# Patient Record
Sex: Male | Born: 1965 | Race: Black or African American | Hispanic: No | Smoking: Current every day smoker
Health system: Southern US, Community
[De-identification: ages and names within clinical notes are randomized; demographics above are authoritative.]

## PROBLEM LIST (undated history)

## (undated) DIAGNOSIS — J449 Chronic obstructive pulmonary disease, unspecified: Secondary | ICD-10-CM

## (undated) DIAGNOSIS — I272 Pulmonary hypertension, unspecified: Secondary | ICD-10-CM

## (undated) DIAGNOSIS — I2724 Chronic thromboembolic pulmonary hypertension: Secondary | ICD-10-CM

## (undated) DIAGNOSIS — M25511 Pain in right shoulder: Secondary | ICD-10-CM

## (undated) DIAGNOSIS — F1011 Alcohol abuse, in remission: Secondary | ICD-10-CM

## (undated) DIAGNOSIS — D649 Anemia, unspecified: Secondary | ICD-10-CM

## (undated) DIAGNOSIS — I2699 Other pulmonary embolism without acute cor pulmonale: Secondary | ICD-10-CM

## (undated) DIAGNOSIS — M25561 Pain in right knee: Secondary | ICD-10-CM

## (undated) HISTORY — DX: Pain in right knee: M25.561

## (undated) HISTORY — DX: Pain in right shoulder: M25.511

## (undated) HISTORY — PX: TONSILLECTOMY: SUR1361

---

## 2011-04-22 ENCOUNTER — Emergency Department (HOSPITAL_COMMUNITY)
Admission: EM | Admit: 2011-04-22 | Discharge: 2011-04-22 | Disposition: A | Discharge status: Home / Self Care | Payer: Self-pay | Attending: Emergency Medicine | Admitting: Emergency Medicine

## 2011-04-22 DIAGNOSIS — K047 Periapical abscess without sinus: Secondary | ICD-10-CM | POA: Insufficient documentation

## 2011-04-22 DIAGNOSIS — K089 Disorder of teeth and supporting structures, unspecified: Secondary | ICD-10-CM | POA: Insufficient documentation

## 2011-10-28 ENCOUNTER — Encounter: Payer: Self-pay | Admitting: *Deleted

## 2011-10-28 ENCOUNTER — Emergency Department (HOSPITAL_COMMUNITY)
Admission: EM | Admit: 2011-10-28 | Discharge: 2011-10-28 | Disposition: A | Discharge status: Home / Self Care | Payer: Self-pay | Attending: Emergency Medicine | Admitting: Emergency Medicine

## 2011-10-28 DIAGNOSIS — M545 Low back pain, unspecified: Secondary | ICD-10-CM

## 2011-10-28 DIAGNOSIS — M5416 Radiculopathy, lumbar region: Secondary | ICD-10-CM

## 2011-10-28 MED ORDER — PREDNISONE 50 MG PO TABS: ORAL_TABLET | ORAL | Status: DC

## 2011-10-28 MED ORDER — HYDROCODONE-ACETAMINOPHEN 5-325 MG PO TABS
1.0000 | ORAL_TABLET | Freq: Once | ORAL | Status: AC
  Administered 2011-10-28: 1 via ORAL
  Filled 2011-10-28: qty 1

## 2011-10-28 MED ORDER — PREDNISONE 20 MG PO TABS
60.0000 mg | ORAL_TABLET | Freq: Once | ORAL | Status: AC
  Administered 2011-10-28: 60 mg via ORAL
  Filled 2011-10-28: qty 3

## 2011-10-28 MED ORDER — HYDROCODONE-ACETAMINOPHEN 5-325 MG PO TABS: 1.0000 | ORAL_TABLET | Freq: Four times a day (QID) | ORAL | Status: AC | PRN

## 2011-10-28 NOTE — ED Notes (Addendum)
Pain rt lower back radiation down post rt thigh. No injury, says he has had pain  In his back for over 2 years.  Became worse today app 2 pm.

## 2011-10-28 NOTE — ED Notes (Signed)
Pt states lower back pain with pain radiating down right leg. Has had same pain before and dx with sciatica, per pt.

## 2011-10-28 NOTE — ED Provider Notes (Signed)
History     CSN: [id number] Arrival date & time: 10/28/2011  5:56 PM   First MD Initiated Contact with Patient 10/28/11 1823      Chief Complaint  Patient presents with  . Back Pain    (Consider location/radiation/quality/duration/timing/severity/associated sxs/prior treatment) HPI Comments: Has had pain in lower back for a couple years with ocassional flare-ups.  Pain worsened 2 days ago and radiates to the R knee.  Patient is a 45 y.o. male presenting with back pain. The history is provided by the patient. No language interpreter was used.  Back Pain  This is a recurrent problem. The current episode started 2 days ago. The problem occurs constantly. The pain is present in the lumbar spine. The pain is at a severity of 8/10. The symptoms are aggravated by twisting and bending. The pain is the same all the time.    History reviewed. No pertinent past medical history.  History reviewed. No pertinent past surgical history.  No family history on file.  History  Substance Use Topics  . Smoking status: Current Everyday Smoker  . Smokeless tobacco: Not on file  . Alcohol Use: Yes     12 pack every 3-4 days.      Review of Systems  Musculoskeletal: Positive for back pain.  All other systems reviewed and are negative.    Allergies  Review of patient's allergies indicates no known allergies.  Home Medications  No current outpatient prescriptions on file.  BP 118/85  Pulse 88  Temp(Src) 98.7 F (37.1 C) (Oral)  Resp 16  Ht 5\' 9"  (1.753 m)  Wt 160 lb (72.576 kg)  BMI 23.63 kg/m2  SpO2 98%  Physical Exam  Nursing note and vitals reviewed. Constitutional: He is oriented to person, place, and time. He appears well-developed and well-nourished.  HENT:  Head: Normocephalic and atraumatic.  Eyes: EOM are normal.  Neck: Normal range of motion.  Cardiovascular: Normal rate, regular rhythm, normal heart sounds and intact distal pulses.   Pulmonary/Chest: Effort normal  and breath sounds normal. No respiratory distress.  Abdominal: Soft. He exhibits no distension. There is no tenderness.  Musculoskeletal:       Lumbar back: He exhibits decreased range of motion, tenderness and pain. He exhibits no bony tenderness.       Back:  Neurological: He is alert and oriented to person, place, and time. He displays normal reflexes. Coordination normal.  Skin: Skin is warm and dry.  Psychiatric: He has a normal mood and affect. Judgment normal.    ED Course  Procedures (including critical care time)  Labs Reviewed - No data to display No results found.   No diagnosis found.    MDM          Worthy Rancher, PA 10/28/11 [id number]

## 2011-10-30 NOTE — ED Provider Notes (Signed)
Medical screening examination/treatment/procedure(s) were performed by non-physician practitioner and as supervising physician I was immediately available for consultation/collaboration.  Nicoletta Dress. Colon Branch, MD 10/30/11 [phone]

## 2015-03-06 ENCOUNTER — Emergency Department (HOSPITAL_COMMUNITY): Payer: PRIVATE HEALTH INSURANCE

## 2015-03-06 ENCOUNTER — Emergency Department (HOSPITAL_COMMUNITY)
Admission: EM | Admit: 2015-03-06 | Discharge: 2015-03-06 | Disposition: A | Discharge status: Home / Self Care | Payer: PRIVATE HEALTH INSURANCE | Attending: Emergency Medicine | Admitting: Emergency Medicine

## 2015-03-06 ENCOUNTER — Encounter (HOSPITAL_COMMUNITY): Payer: Self-pay | Admitting: Emergency Medicine

## 2015-03-06 DIAGNOSIS — Z720 Tobacco use: Secondary | ICD-10-CM | POA: Diagnosis not present

## 2015-03-06 DIAGNOSIS — M5440 Lumbago with sciatica, unspecified side: Secondary | ICD-10-CM | POA: Diagnosis not present

## 2015-03-06 DIAGNOSIS — M545 Low back pain: Secondary | ICD-10-CM | POA: Diagnosis present

## 2015-03-06 DIAGNOSIS — K5900 Constipation, unspecified: Secondary | ICD-10-CM | POA: Insufficient documentation

## 2015-03-06 DIAGNOSIS — Z7982 Long term (current) use of aspirin: Secondary | ICD-10-CM | POA: Insufficient documentation

## 2015-03-06 LAB — CBC WITH DIFFERENTIAL/PLATELET
BASOS ABS: 0 10*3/uL (ref 0.0–0.1)
BASOS PCT: 0 % (ref 0–1)
EOS PCT: 2 % (ref 0–5)
Eosinophils Absolute: 0.1 10*3/uL (ref 0.0–0.7)
HEMATOCRIT: 48.6 % (ref 39.0–52.0)
Hemoglobin: 16.4 g/dL (ref 13.0–17.0)
LYMPHS PCT: 29 % (ref 12–46)
Lymphs Abs: 2.1 10*3/uL (ref 0.7–4.0)
MCH: 32.7 pg (ref 26.0–34.0)
MCHC: 33.7 g/dL (ref 30.0–36.0)
MCV: 96.8 fL (ref 78.0–100.0)
Monocytes Absolute: 0.7 10*3/uL (ref 0.1–1.0)
Monocytes Relative: 10 % (ref 3–12)
Neutro Abs: 4.3 10*3/uL (ref 1.7–7.7)
Neutrophils Relative %: 59 % (ref 43–77)
PLATELETS: 291 10*3/uL (ref 150–400)
RBC: 5.02 MIL/uL (ref 4.22–5.81)
RDW: 13.3 % (ref 11.5–15.5)
WBC: 7.2 10*3/uL (ref 4.0–10.5)

## 2015-03-06 LAB — URINALYSIS, ROUTINE W REFLEX MICROSCOPIC
BILIRUBIN URINE: NEGATIVE
Glucose, UA: NEGATIVE mg/dL
Hgb urine dipstick: NEGATIVE
KETONES UR: NEGATIVE mg/dL
Leukocytes, UA: NEGATIVE
NITRITE: NEGATIVE
PH: 5.5 (ref 5.0–8.0)
Protein, ur: NEGATIVE mg/dL
Specific Gravity, Urine: 1.02 (ref 1.005–1.030)
UROBILINOGEN UA: 0.2 mg/dL (ref 0.0–1.0)

## 2015-03-06 MED ORDER — DOCUSATE SODIUM 100 MG PO CAPS: 100.0000 mg | ORAL_CAPSULE | Freq: Two times a day (BID) | ORAL | Status: DC

## 2015-03-06 MED ORDER — NAPROXEN 500 MG PO TABS: 500.0000 mg | ORAL_TABLET | Freq: Two times a day (BID) | ORAL | Status: DC

## 2015-03-06 MED ORDER — METHOCARBAMOL 500 MG PO TABS: 500.0000 mg | ORAL_TABLET | Freq: Three times a day (TID) | ORAL | Status: DC | PRN

## 2015-03-06 MED ORDER — CYCLOBENZAPRINE HCL 10 MG PO TABS
10.0000 mg | ORAL_TABLET | Freq: Once | ORAL | Status: AC
  Administered 2015-03-06: 10 mg via ORAL
  Filled 2015-03-06: qty 1

## 2015-03-06 MED ORDER — HYDROCODONE-ACETAMINOPHEN 5-325 MG PO TABS
1.0000 | ORAL_TABLET | Freq: Once | ORAL | Status: AC
  Administered 2015-03-06: 1 via ORAL
  Filled 2015-03-06: qty 1

## 2015-03-06 MED ORDER — MAGNESIUM CITRATE PO SOLN
1.0000 | Freq: Once | ORAL | Status: AC
  Administered 2015-03-06: 1 via ORAL
  Filled 2015-03-06: qty 296

## 2015-03-06 MED ORDER — TRAMADOL HCL 50 MG PO TABS: 50.0000 mg | ORAL_TABLET | Freq: Four times a day (QID) | ORAL | Status: DC | PRN

## 2015-03-06 MED ORDER — PREDNISONE 50 MG PO TABS
60.0000 mg | ORAL_TABLET | Freq: Once | ORAL | Status: AC
  Administered 2015-03-06: 60 mg via ORAL
  Filled 2015-03-06 (×2): qty 1

## 2015-03-06 NOTE — ED Notes (Signed)
Pt is getting dressed.

## 2015-03-06 NOTE — Discharge Instructions (Signed)
Back Pain, Adult Back pain is very common. The pain often gets better over time. The cause of back pain is usually not dangerous. Most people can learn to manage their back pain on their own.  HOME CARE   Stay active. Start with short walks on flat ground if you can. Try to walk farther each day.  Do not sit, drive, or stand in one place for more than 30 minutes. Do not stay in bed.  Do not avoid exercise or work. Activity can help your back heal faster.  Be careful when you bend or lift an object. Bend at your knees, keep the object close to you, and do not twist.  Sleep on a firm mattress. Lie on your side, and bend your knees. If you lie on your back, put a pillow under your knees.  Only take medicines as told by your doctor.  Put ice on the injured area.  Put ice in a plastic bag.  Place a towel between your skin and the bag.  Leave the ice on for 15-20 minutes, 03-04 times a day for the first 2 to 3 days. After that, you can switch between ice and heat packs.  Ask your doctor about back exercises or massage.  Avoid feeling anxious or stressed. Find good ways to deal with stress, such as exercise. GET HELP RIGHT AWAY IF:   Your pain does not go away with rest or medicine.  Your pain does not go away in 1 week.  You have new problems.  You do not feel well.  The pain spreads into your legs.  You cannot control when you poop (bowel movement) or pee (urinate).  Your arms or legs feel weak or lose feeling (numbness).  You feel sick to your stomach (nauseous) or throw up (vomit).  You have belly (abdominal) pain.  You feel like you may pass out (faint). MAKE SURE YOU:   Understand these instructions.  Will watch your condition.  Will get help right away if you are not doing well or get worse. Document Released: 04/13/2008 Document Revised: 01/18/2012 Document Reviewed: 02/27/2014 [facility] Patient Information 2015 [place], [place]. This information is not intended  to replace advice given to you by your health care provider. Make sure you discuss any questions you have with your health care provider.    Constipation Constipation is when a person:  Poops (has a bowel movement) less than 3 times a week.  Has a hard time pooping.  Has poop that is dry, hard, or bigger than normal. HOME CARE   Eat foods with a lot of fiber in them. This includes fruits, vegetables, beans, and whole grains such as brown rice.  Avoid fatty foods and foods with a lot of sugar. This includes french fries, hamburgers, cookies, candy, and soda.  If you are not getting enough fiber from food, take products with added fiber in them (supplements).  Drink enough fluid to keep your pee (urine) clear or pale yellow.  Exercise on a regular basis, or as told by your doctor.  Go to the restroom when you feel like you need to poop. Do not hold it.  Only take medicine as told by your doctor. Do not take medicines that help you poop (laxatives) without talking to your doctor first. GET HELP RIGHT AWAY IF:   You have bright red blood in your poop (stool).  Your constipation lasts more than 4 days or gets worse.  You have belly (abdominal) or butt (rectal)  pain.  You have thin poop (as thin as a pencil).  You lose weight, and it cannot be explained. MAKE SURE YOU:   Understand these instructions.  Will watch your condition.  Will get help right away if you are not doing well or get worse. Document Released: 04/13/2008 Document Revised: 10/31/2013 Document Reviewed: 08/07/2013 [facility] Patient Information 2015 [place], [place]. This information is not intended to replace advice given to you by your health care provider. Make sure you discuss any questions you have with your health care provider.

## 2015-03-06 NOTE — ED Provider Notes (Addendum)
CSN: [id number]     Arrival date & time 03/06/15  1142 History   First MD Initiated Contact with Patient 03/06/15 1259     Chief Complaint  Patient presents with  . Back Pain      HPI  Patient presents for evaluation essentially 2 separate complaints. One is an exacerbation of back pain. Bilateral lower back pain. Hurts to move and walk.  He rates to his buttock. Does not radiate to the anterior leg below the knee and any radicular fashion. No weakness leg. He states occasionally his left leg will hurt so bad that it "almost gives out". No change in bowel or bladder habits other than constipation for the last 5 days. No history of blood in his stools now, or ever. No fevers no chills. No intra-abdominal pain. Still has a good appetite without nausea or vomiting. States he didn't need yesterday because "I was afraid to because I haven't gone in 4 days".   History reviewed. No pertinent past medical history. History reviewed. No pertinent past surgical history. History reviewed. No pertinent family history. History  Substance Use Topics  . Smoking status: Current Every Day Smoker -- 1.00 packs/day  . Smokeless tobacco: Not on file  . Alcohol Use: Yes     Comment: 12 pack every 3-4 days.    Review of Systems  Constitutional: Negative for fever, chills, diaphoresis, appetite change and fatigue.  HENT: Negative for mouth sores, sore throat and trouble swallowing.   Eyes: Negative for visual disturbance.  Respiratory: Negative for cough, chest tightness, shortness of breath and wheezing.   Cardiovascular: Negative for chest pain.  Gastrointestinal: Positive for constipation. Negative for nausea, vomiting, abdominal pain, diarrhea and abdominal distention.  Endocrine: Negative for polydipsia, polyphagia and polyuria.  Genitourinary: Negative for dysuria, frequency and hematuria.  Musculoskeletal: Positive for back pain. Negative for gait problem.  Skin: Negative for color change, pallor  and rash.  Neurological: Negative for dizziness, syncope, light-headedness and headaches.  Hematological: Does not bruise/bleed easily.  Psychiatric/Behavioral: Negative for behavioral problems and confusion.      Allergies  Review of patient's allergies indicates no known allergies.  Home Medications   Prior to Admission medications   Medication Sig Start Date End Date Taking? Authorizing Provider  Aspirin-Acetaminophen-Caffeine (EXCEDRIN MIGRAINE PO) Take 1 tablet by mouth daily.   Yes Historical Provider, MD  ibuprofen (ADVIL,MOTRIN) 200 MG tablet Take 200 mg by mouth every 6 (six) hours as needed.   Yes Historical Provider, MD  docusate sodium (COLACE) 100 MG capsule Take 1 capsule (100 mg total) by mouth every 12 (twelve) hours. 03/06/15   Rolland Porter, MD  methocarbamol (ROBAXIN) 500 MG tablet Take 1 tablet (500 mg total) by mouth 3 (three) times daily between meals as needed. 03/06/15   Rolland Porter, MD  naproxen (NAPROSYN) 500 MG tablet Take 1 tablet (500 mg total) by mouth 2 (two) times daily. 03/06/15   Rolland Porter, MD  predniSONE (DELTASONE) 50 MG tablet Take one tab po QD until gone. Patient not taking: Reported on 03/06/2015 10/28/11   Worthy Rancher, PA-C  traMADol (ULTRAM) 50 MG tablet Take 1 tablet (50 mg total) by mouth every 6 (six) hours as needed. 03/06/15   Rolland Porter, MD   BP 116/86 mmHg  Pulse 79  Temp(Src) 98.2 F (36.8 C) (Oral)  Resp 20  Ht  (1.753 m)  Wt 145 lb (65.772 kg)  BMI 21.40 kg/m2  SpO2 100% Physical Exam  Constitutional: He is  oriented to person, place, and time. He appears well-developed and well-nourished. No distress.  HENT:  Head: Normocephalic.  Eyes: Conjunctivae are normal. Pupils are equal, round, and reactive to light. No scleral icterus.  Neck: Normal range of motion. Neck supple. No thyromegaly present.  Cardiovascular: Normal rate and regular rhythm.  Exam reveals no gallop and no friction rub.   No murmur heard. Pulmonary/Chest:  Effort normal and breath sounds normal. No respiratory distress. He has no wheezes. He has no rales.  Abdominal: Soft. Bowel sounds are normal. He exhibits no distension. There is no tenderness. There is no rebound.  Soft benign abdomen. No tenderness. Subjectively no pain. Normal active bowel sounds.  Musculoskeletal: Normal range of motion.       Back:  Neurological: He is alert and oriented to person, place, and time.  Normal symmetric Strength to shoulder shrug, triceps, biceps, grip,wrist flex/extend,and intrinsics  Norma lsymmetric sensation above and below clavicles, and to all distributions to UEs. Norma symmetric strength to flex/.extend hip and knees, dorsi/plantar flex ankles. Normal symmetric sensation to all distributions to LEs Patellar and achilles reflexes 1-2+. Downgoing Babinski   Skin: Skin is warm and dry. No rash noted.  Psychiatric: He has a normal mood and affect. His behavior is normal.    ED Course  Procedures (including critical care time) Labs Review Labs Reviewed  URINALYSIS, ROUTINE W REFLEX MICROSCOPIC  CBC WITH DIFFERENTIAL/PLATELET    Imaging Review Dg Abd 1 View  03/06/2015   CLINICAL DATA:  Lower abdominal and back pain. Constipation for 5 days.  EXAM: ABDOMEN - 1 VIEW  COMPARISON:  None.  FINDINGS: No evidence of dilated bowel loops. A moderate amount of stool is seen scattered throughout the colon. No radiopaque calculi identified. Lower lumbar spine degenerative changes noted.  IMPRESSION: No acute findings.  Moderate stool burden noted.   Electronically Signed   By: Myles RosenthalJohn  Stahl M.D.   On: 03/06/2015 15:03     EKG Interpretation None      MDM   Final diagnoses:  Constipated  Low back pain with sciatica, sciatica laterality unspecified, unspecified back pain laterality    Plan will be symptomatic treatment for his back. Magnesium citrate here. Half now, half tomorrow if no improvement.    Rolland PorterMark Asaad Gulley, MD 03/06/15 1541  Rolland PorterMark Varvara Legault,  MD 03/06/15 [phone]

## 2015-03-06 NOTE — ED Notes (Signed)
Pt is back from radiology

## 2015-03-06 NOTE — ED Notes (Addendum)
Pt reports lower back pain, bilateral leg tingling, no BM for several days. Pt reports otc miralax with no improvement. Pt denies any abdominal pain,n/v.

## 2016-05-16 IMAGING — DX DG ABDOMEN 1V
1 series · 1 of 1 positions shown · non-contrast
Comparison: None.

CLINICAL DATA: Lower abdominal and back pain. Constipation for 5
days.

EXAM:
ABDOMEN - 1 VIEW

[abdomen supine]
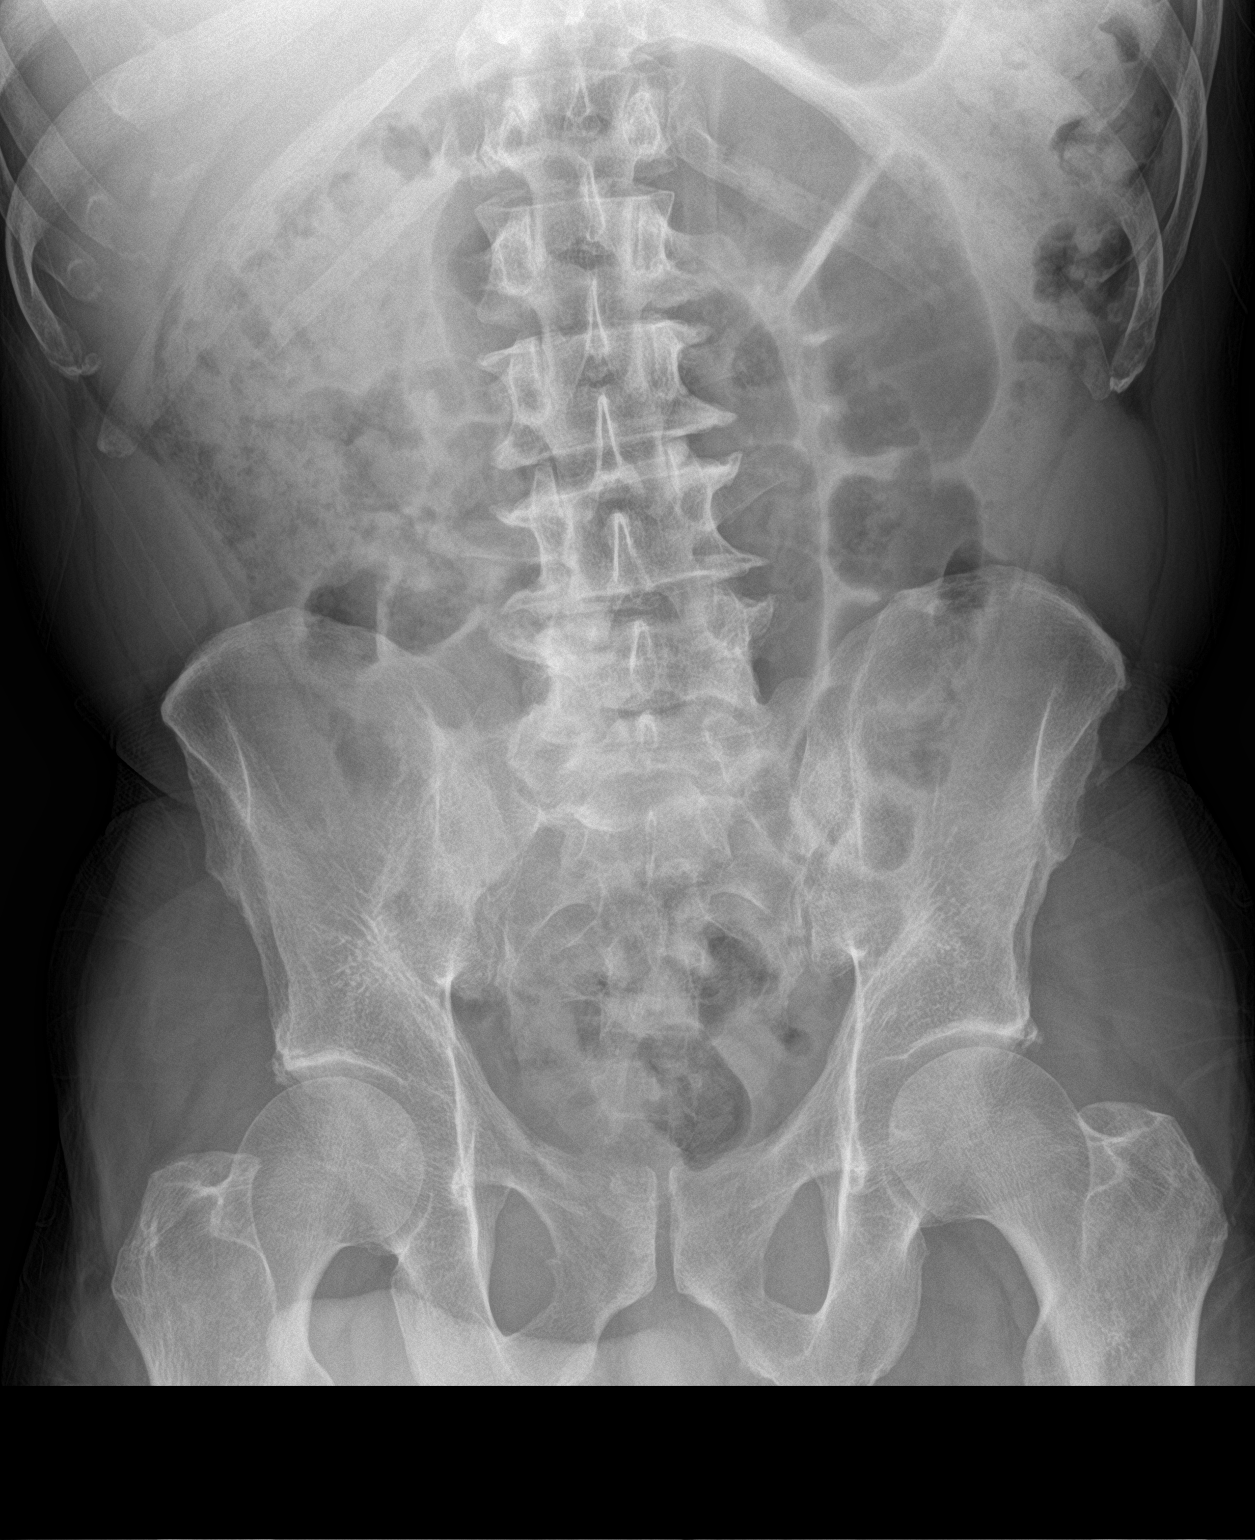

[1 of 1 positions shown; findings below may reference images not displayed]

FINDINGS: No evidence of dilated bowel loops. A moderate amount of stool is
seen scattered throughout the colon. No radiopaque calculi
identified. Lower lumbar spine degenerative changes noted.
IMPRESSION: No acute findings.  Moderate stool burden noted.

## 2017-12-02 ENCOUNTER — Emergency Department (HOSPITAL_COMMUNITY)
Admission: EM | Admit: 2017-12-02 | Discharge: 2017-12-02 | Disposition: A | Discharge status: Home / Self Care | Payer: Self-pay | Attending: Emergency Medicine | Admitting: Emergency Medicine

## 2017-12-02 ENCOUNTER — Emergency Department (HOSPITAL_COMMUNITY): Payer: Self-pay

## 2017-12-02 ENCOUNTER — Other Ambulatory Visit: Payer: Self-pay

## 2017-12-02 ENCOUNTER — Encounter (HOSPITAL_COMMUNITY): Payer: Self-pay

## 2017-12-02 DIAGNOSIS — S8391XA Sprain of unspecified site of right knee, initial encounter: Secondary | ICD-10-CM | POA: Insufficient documentation

## 2017-12-02 DIAGNOSIS — W109XXA Fall (on) (from) unspecified stairs and steps, initial encounter: Secondary | ICD-10-CM | POA: Insufficient documentation

## 2017-12-02 DIAGNOSIS — F17200 Nicotine dependence, unspecified, uncomplicated: Secondary | ICD-10-CM | POA: Insufficient documentation

## 2017-12-02 DIAGNOSIS — Y929 Unspecified place or not applicable: Secondary | ICD-10-CM | POA: Insufficient documentation

## 2017-12-02 DIAGNOSIS — Z79899 Other long term (current) drug therapy: Secondary | ICD-10-CM | POA: Insufficient documentation

## 2017-12-02 DIAGNOSIS — Y9301 Activity, walking, marching and hiking: Secondary | ICD-10-CM | POA: Insufficient documentation

## 2017-12-02 DIAGNOSIS — Y999 Unspecified external cause status: Secondary | ICD-10-CM | POA: Insufficient documentation

## 2017-12-02 MED ORDER — HYDROCODONE-ACETAMINOPHEN 5-325 MG PO TABS: 1.0000 | ORAL_TABLET | Freq: Four times a day (QID) | ORAL | 0 refills | Status: DC | PRN

## 2017-12-02 NOTE — ED Triage Notes (Signed)
Pt slipped on some steps and his right leg ended up underneath him with his lower leg landing on concrete.  Pt has pain to knee and to shin with small avulsion to shin area.

## 2017-12-02 NOTE — ED Notes (Signed)
Pt ambulatory to waiting room. Pt verbalized understanding of discharge instructions.   

## 2017-12-02 NOTE — Discharge Instructions (Signed)
Wear knee immobilizer as applied.  Ice for 20 minutes every 2 hours while awake for the next 2 days.  Hydrocodone is prescribed as needed for pain.  Follow-up with your orthopedic surgery if symptoms are not improving in the next 3-4 days.  The contact information for Dr. Romeo AppleHarrison has been provided in this discharge summary for you to call and make these arrangements.

## 2017-12-02 NOTE — ED Notes (Signed)
Patient transported to X-ray 

## 2017-12-02 NOTE — ED Provider Notes (Signed)
[facility] EMERGENCY DEPARTMENT Provider Note   CSN: [id number] Arrival date & time: 12/02/17  0240     History   Chief Complaint Chief Complaint  Patient presents with  . Knee Injury    HPI COULTON SCHLINK is a 52 y.o. male.  Patient is a 52 year old male presenting for evaluation of a right knee injury.  He reports walking down several stairs this evening, slipping, and twisting his knee awkwardly.  He has been unable to walk due to pain since.  He denies any other injury.   The history is provided by the patient.    History reviewed. No pertinent past medical history.  There are no active problems to display for this patient.   No past surgical history on file.     Home Medications    Prior to Admission medications   Medication Sig Start Date End Date Taking? Authorizing Provider  ibuprofen (ADVIL,MOTRIN) 200 MG tablet Take 200 mg by mouth every 6 (six) hours as needed.   Yes [provider]  Aspirin-Acetaminophen-Caffeine (EXCEDRIN MIGRAINE PO) Take 1 tablet by mouth daily.    [provider]  docusate sodium (COLACE) 100 MG capsule Take 1 capsule (100 mg total) by mouth every 12 (twelve) hours. 03/06/15   Rolland Porter, MD  methocarbamol (ROBAXIN) 500 MG tablet Take 1 tablet (500 mg total) by mouth 3 (three) times daily between meals as needed. 03/06/15   Rolland Porter, MD  naproxen (NAPROSYN) 500 MG tablet Take 1 tablet (500 mg total) by mouth 2 (two) times daily. 03/06/15   Rolland Porter, MD  predniSONE (DELTASONE) 50 MG tablet Take one tab po QD until gone. Patient not taking: Reported on 03/06/2015 10/28/11   Worthy Rancher, PA-C  traMADol (ULTRAM) 50 MG tablet Take 1 tablet (50 mg total) by mouth every 6 (six) hours as needed. 03/06/15   Rolland Porter, MD    Family History No family history on file.  Social History Social History   Tobacco Use  . Smoking status: Current Every Day Smoker    Packs/day: 1.00  . Smokeless tobacco: Never Used    Substance Use Topics  . Alcohol use: Yes    Comment: 12 pack every 3-4 days.  . Drug use: Yes    Types: Marijuana, Cocaine    Comment: last use 03/03/15     Allergies   Patient has no known allergies.   Review of Systems Review of Systems  All other systems reviewed and are negative.    Physical Exam Updated Vital Signs BP 123/87   Pulse (!) 102   Temp (!) 97.5 F (36.4 C) (Oral)   Resp 18   Ht 5\' 9"  (1.753 m)   Wt 61.2 kg (135 lb)   SpO2 97%   BMI 19.94 kg/m   Physical Exam  Constitutional: He appears well-developed and well-nourished. No distress.  HENT:  Head: Normocephalic and atraumatic.  Neck: Normal range of motion. Neck supple.  Musculoskeletal:  The right knee has some swelling/effusion.  Exam is very limited secondary to pain.  He does have an abrasion over the mid anterior tibia, however nothing that requires sutures.  Neurological: He is alert.  Skin: Skin is warm and dry. He is not diaphoretic.  Nursing note and vitals reviewed.    ED Treatments / Results  Labs (all labs ordered are listed, but only abnormal results are displayed) Labs Reviewed - No data to display  EKG  EKG Interpretation None  Radiology No results found.  Procedures Procedures (including critical care time)  Medications Ordered in ED Medications - No data to display   Initial Impression / Assessment and Plan / ED Course  I have reviewed the triage vital signs and the nursing notes.  Pertinent labs & imaging results that were available during my care of the patient were reviewed by me and considered in my medical decision making (see chart for details).  Patient's x-rays are negative for fracture or misalignment.  There is a small joint effusion and prepatellar soft tissue swelling.  I am only able to get a very limited exam secondary to discomfort.  He will be placed in a knee immobilizer and advised to follow-up with orthopedics in the next week for  recheck.  In the meantime, he is to ice, rest.  Final Clinical Impressions(s) / ED Diagnoses   Final diagnoses:  None    ED Discharge Orders    None       Geoffery Lyonselo, Adryanna Friedt, MD 12/02/17 0400

## 2019-02-12 IMAGING — DX DG TIBIA/FIBULA 2V*R*
4 series · 4 of 4 positions shown · non-contrast
Comparison: None.

CLINICAL DATA: Fall going down steps pain to the proximal tibia and
fibula

EXAM:
RIGHT TIBIA AND FIBULA - 2 VIEW

[tibia ap (1 of 2)]
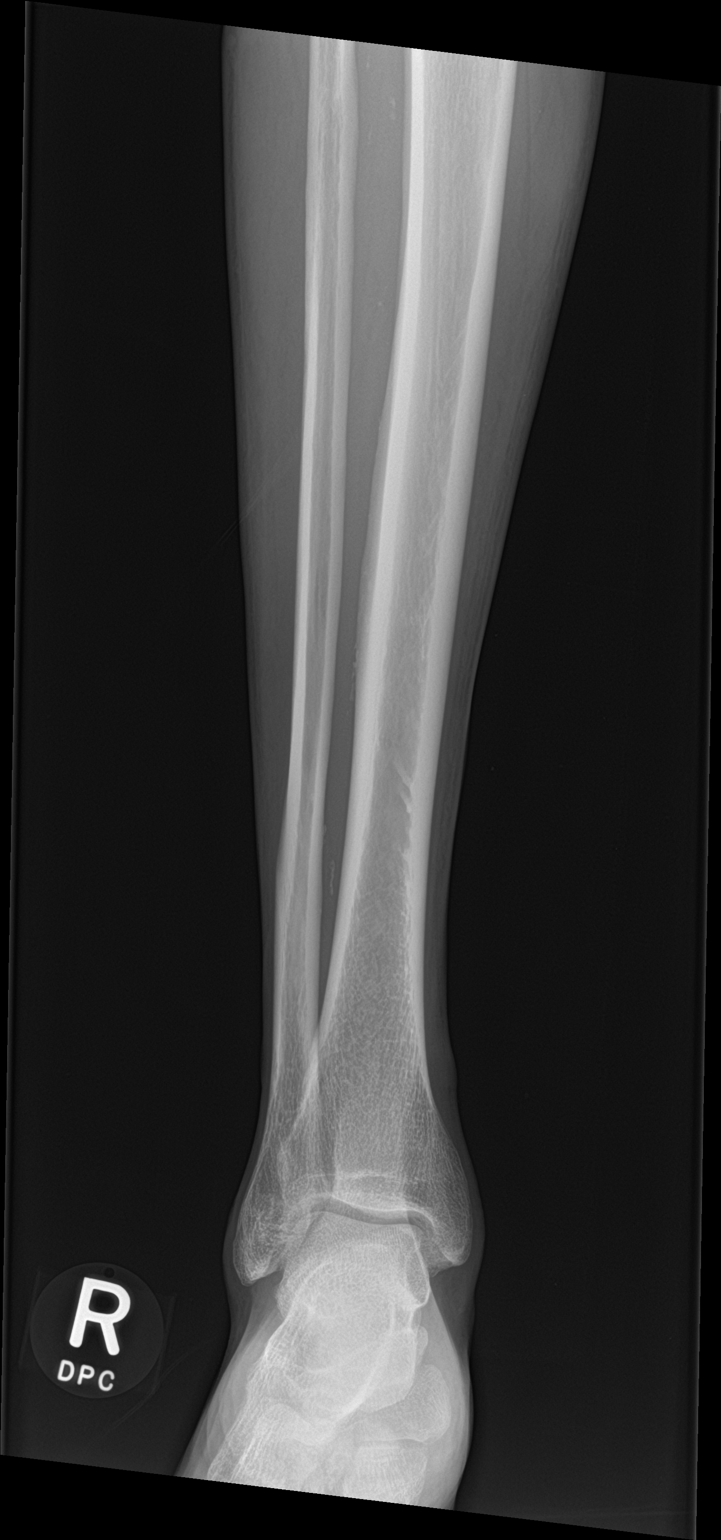

[tibia ap (2 of 2)]
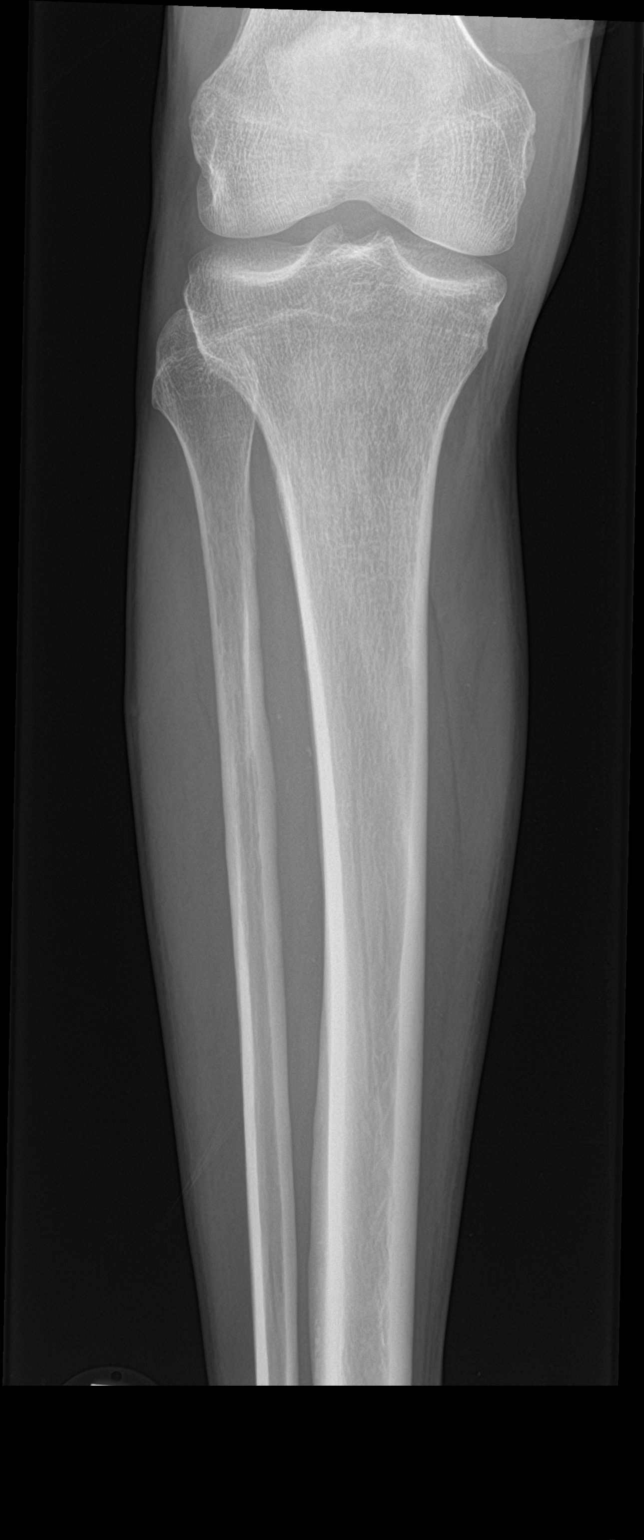

[tibia lat (1 of 2)]
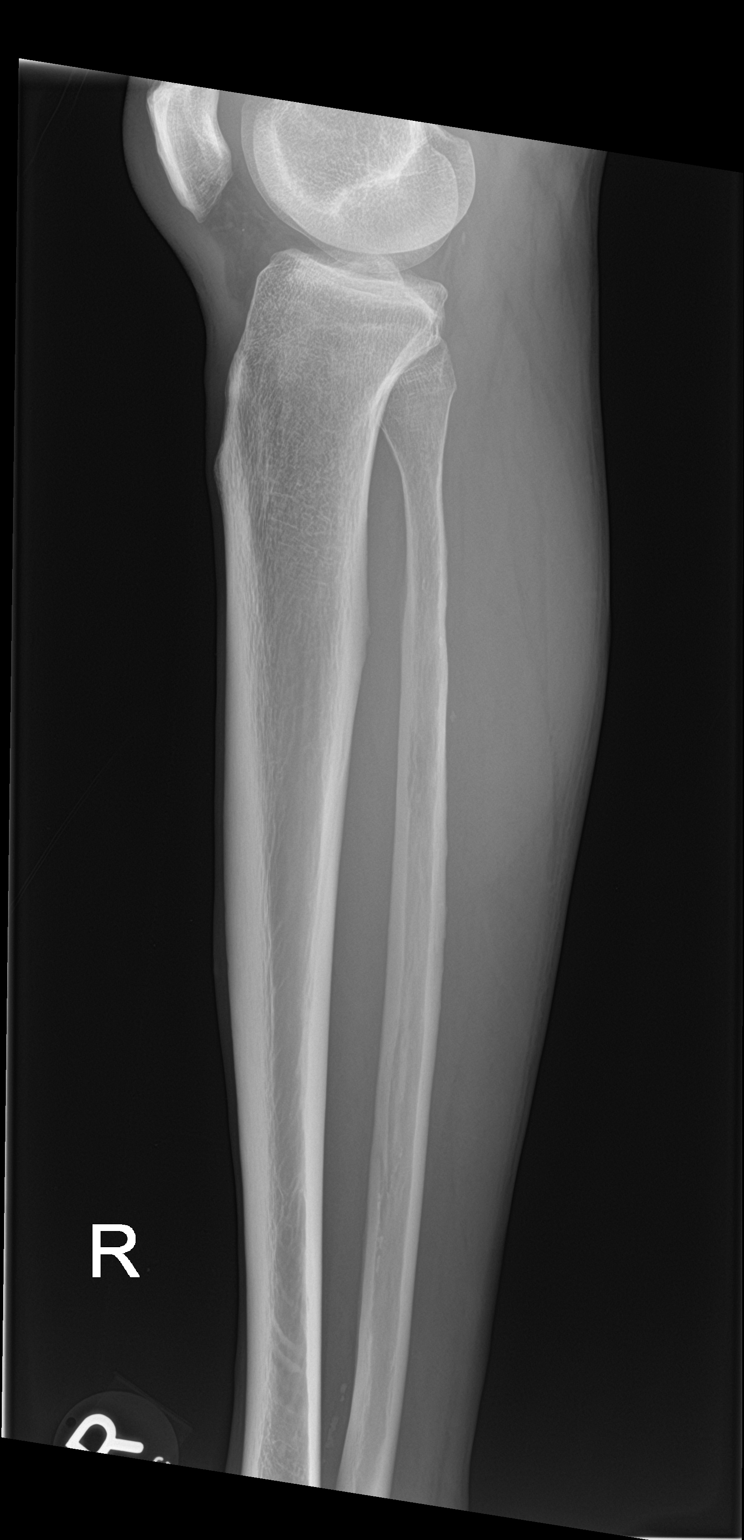

[tibia lat (2 of 2)]
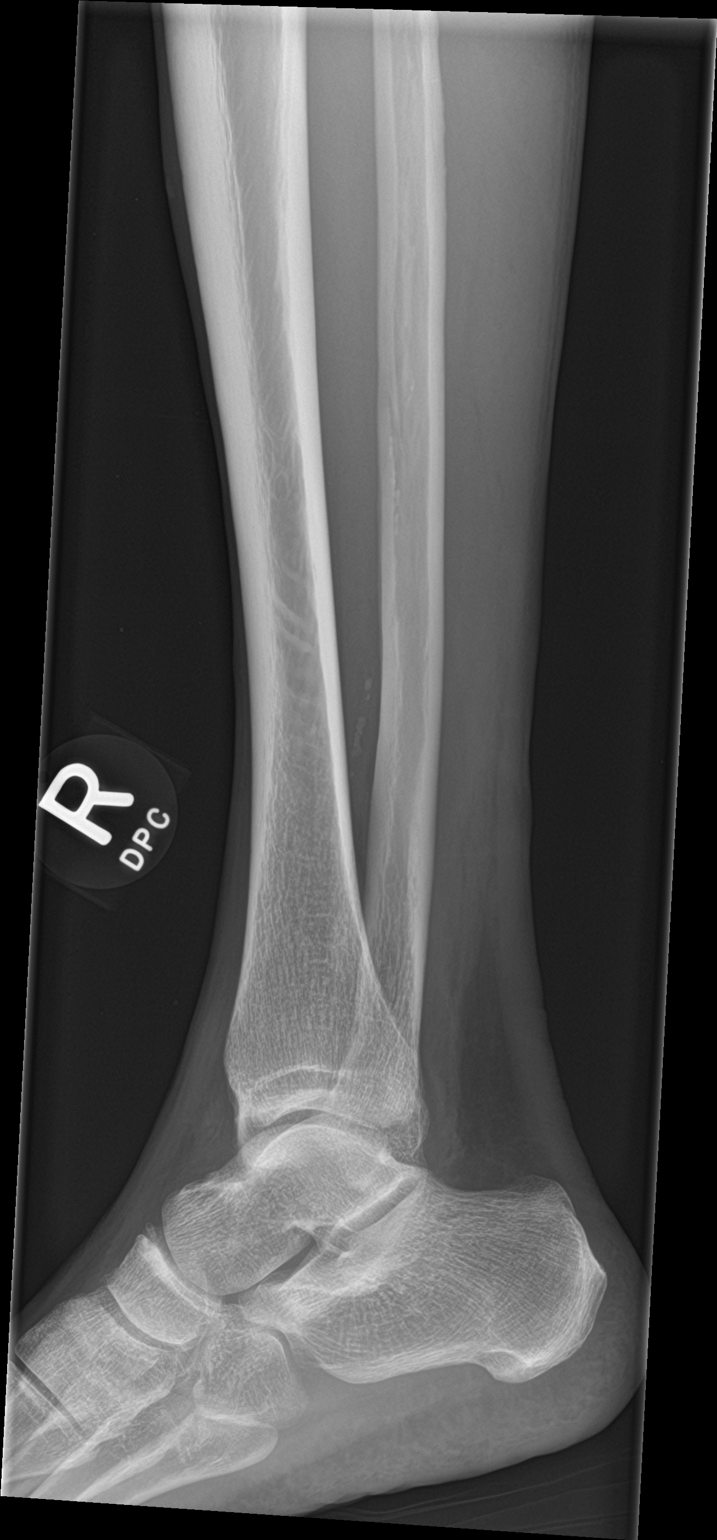

[4 of 4 positions shown; findings below may reference images not displayed]

FINDINGS: There is no evidence of fracture or other focal bone lesions. Soft
tissues are unremarkable.
IMPRESSION: Negative.

## 2019-02-12 IMAGING — DX DG KNEE COMPLETE 4+V*R*
4 series · 4 of 4 positions shown · non-contrast
Comparison: None.

CLINICAL DATA: Fall, knee pain

EXAM:
RIGHT KNEE - COMPLETE 4+ VIEW

[knee ap (1 of 3)]
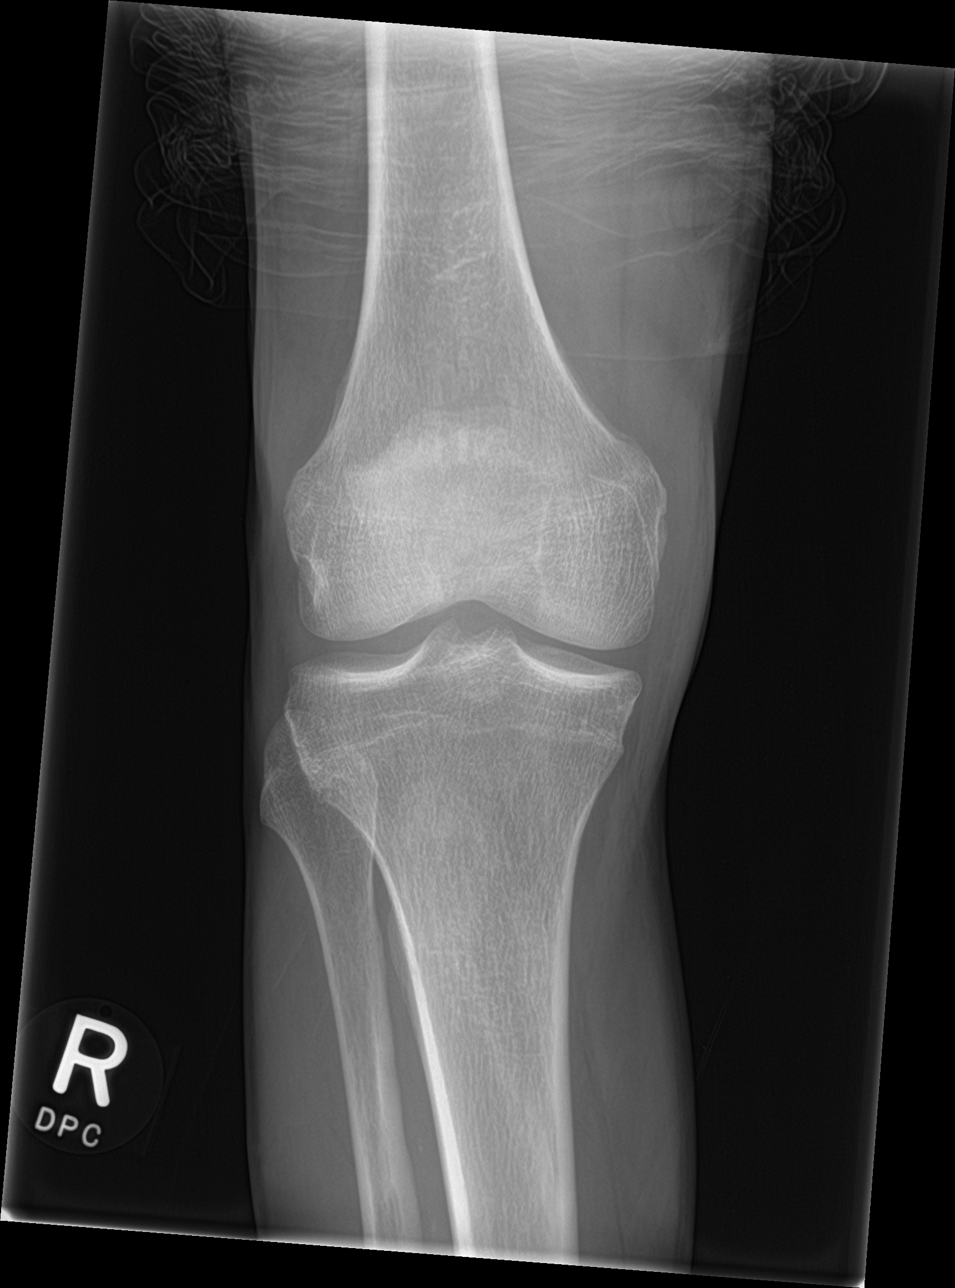

[knee lat]
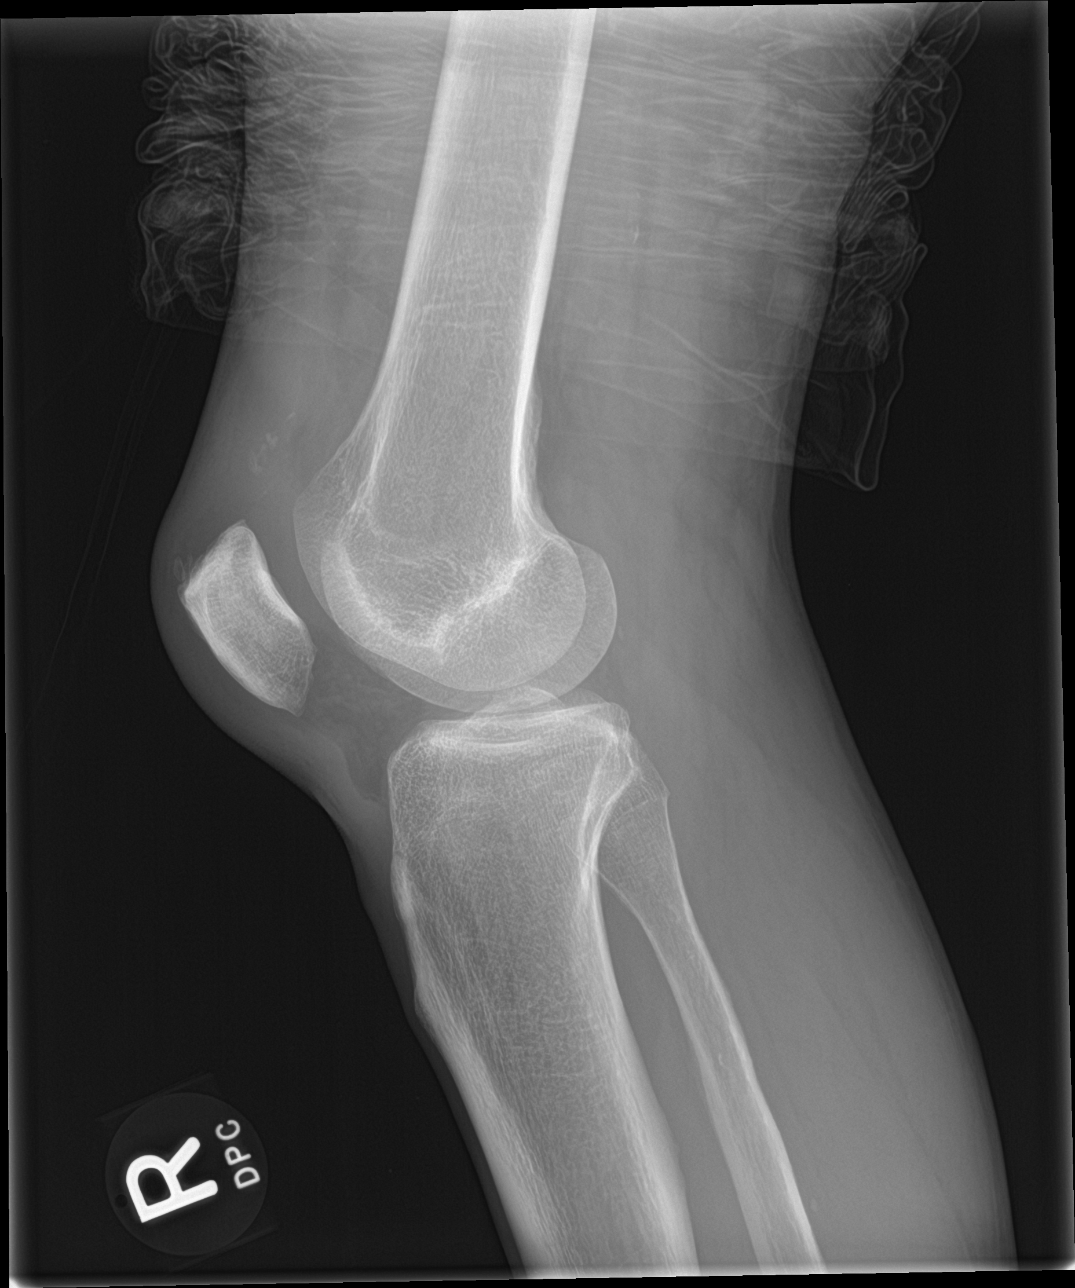

[knee ap (2 of 3)]
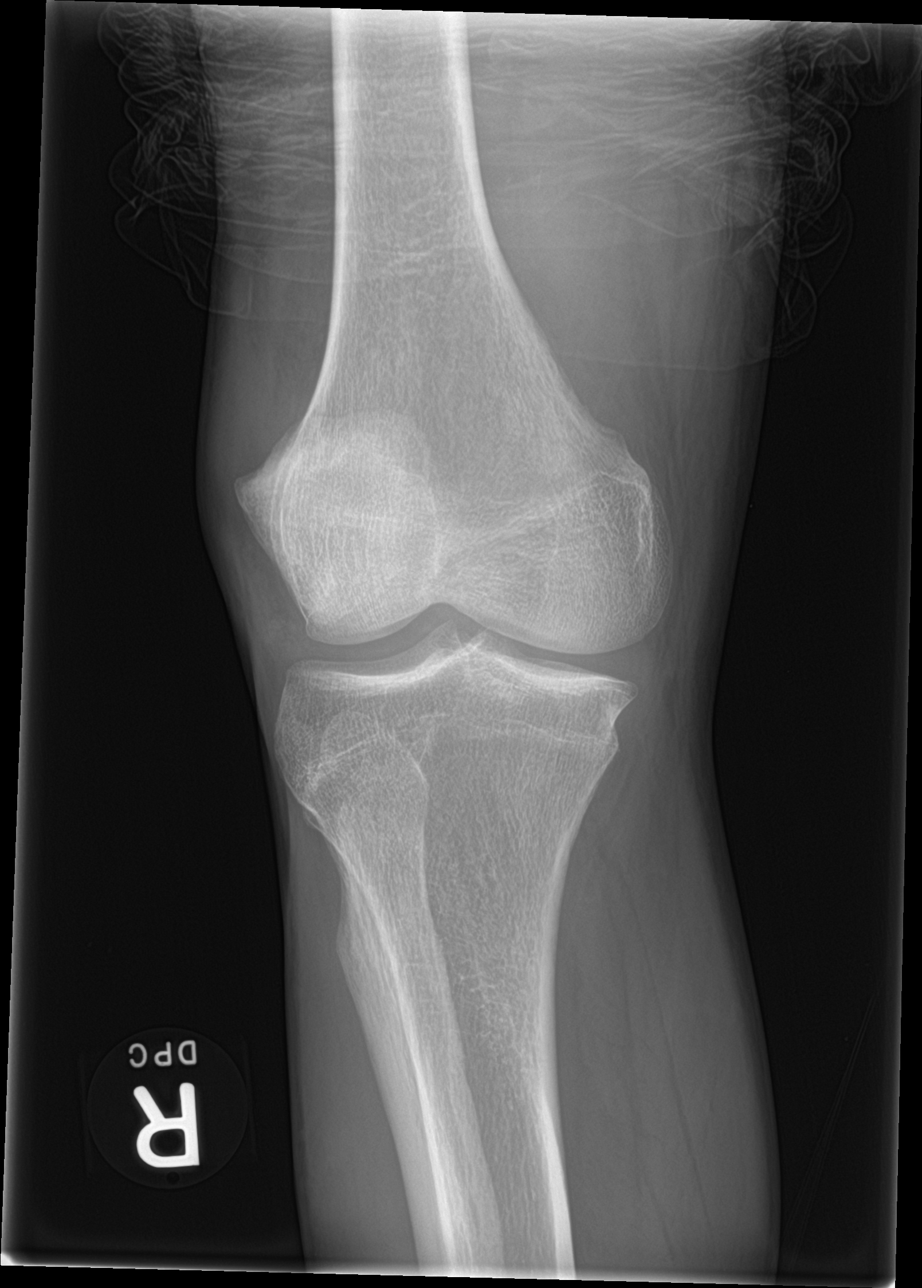

[knee ap (3 of 3)]
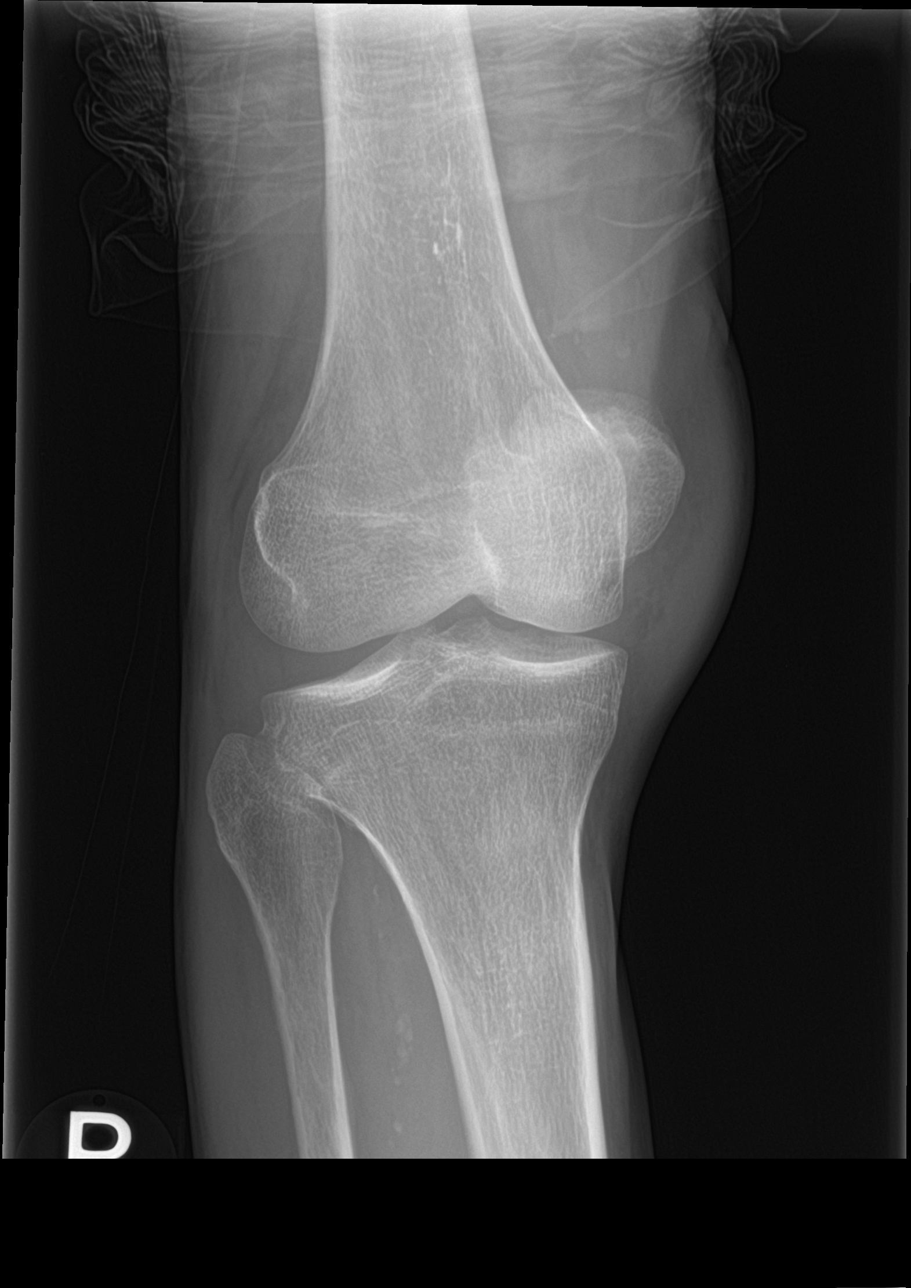

[4 of 4 positions shown; findings below may reference images not displayed]

FINDINGS: No fracture or malalignment. Prepatellar soft tissue swelling. Small
suprapatellar joint effusion. Punctate calcifications superior joint
space may reflect small loose bodies
IMPRESSION: 1. No acute osseous abnormality
2. Small joint effusion.  Prepatellar soft tissue swelling.

## 2021-04-02 DIAGNOSIS — Z139 Encounter for screening, unspecified: Secondary | ICD-10-CM

## 2021-04-02 LAB — GLUCOSE, POCT (MANUAL RESULT ENTRY): POC Glucose: 90 mg/dl (ref 70–99)

## 2021-04-03 NOTE — Congregational Nurse Program (Signed)
Pt attended [facility] to receive assistance with getting help to get enrolled in the  Care Connect Uninsured Program.    States that theyd last saw a PCP at [facility] (2018) and that his last ER visit was (2019).   Pt chief complaint(s) for wanting to access a medical provider is to receive an complete wellness check; an evaluation of his right leg pain and numbness, his left shoulder pain/left arm pain & numbness.  States pain is experienced daily. States his shoulder, arm, and leg pain first came about as result of two (2) different falls in 2019.    He states a medical hx of anxiety with no meds;  Hx of migraines with meds, but not currently taken; hx of taking vitamin D supplements but last took  (4 yrs ago)  Vitals Check (see vitals section): BP (abnormal);   Plan Completed on Today:   -Care Connect Enrollment/Eligibility completed -Referral sent to RN Nurse Case Manager, Norval Gable, for initial follow up and continous medical case management after first appointment with the [facility] [place].    -Socio-determinant needs at this time identified food and transportation.  - Pt was referred to dept of social services to sign up for food stamps and provided the contact information, in addition provided a food resource list for [employer]. Pt will   -Scheduled initial  PCP appointment with the [facility] [place] for Wed April 10, 2021 at 1 pm.   Instructions regarding appointment details were provided (i.e. bring medicine containers, cost of visit, arrival, etc)

## 2021-04-10 ENCOUNTER — Ambulatory Visit: Payer: Self-pay | Admitting: Physician Assistant

## 2021-04-10 ENCOUNTER — Encounter: Payer: Self-pay | Admitting: Physician Assistant

## 2021-04-10 ENCOUNTER — Telehealth: Payer: Self-pay

## 2021-04-10 ENCOUNTER — Other Ambulatory Visit: Payer: Self-pay

## 2021-04-10 VITALS — BP 122/84 | HR 91 | Temp 98.3°F | Ht 68.0 in | Wt 121.0 lb

## 2021-04-10 DIAGNOSIS — G8929 Other chronic pain: Secondary | ICD-10-CM

## 2021-04-10 DIAGNOSIS — F17200 Nicotine dependence, unspecified, uncomplicated: Secondary | ICD-10-CM

## 2021-04-10 DIAGNOSIS — Z833 Family history of diabetes mellitus: Secondary | ICD-10-CM

## 2021-04-10 DIAGNOSIS — Z7689 Persons encountering health services in other specified circumstances: Secondary | ICD-10-CM

## 2021-04-10 DIAGNOSIS — Z131 Encounter for screening for diabetes mellitus: Secondary | ICD-10-CM

## 2021-04-10 DIAGNOSIS — Z13220 Encounter for screening for lipoid disorders: Secondary | ICD-10-CM

## 2021-04-10 DIAGNOSIS — Z125 Encounter for screening for malignant neoplasm of prostate: Secondary | ICD-10-CM

## 2021-04-10 DIAGNOSIS — Z1211 Encounter for screening for malignant neoplasm of colon: Secondary | ICD-10-CM

## 2021-04-10 NOTE — Telephone Encounter (Signed)
Called to follow up with client after [facility] appointment  Today. Client reports it went very well. Reviewed process for colon cancer screening as well as lab work ordered.  Client is aware of his [facility]. Per provider notes and client he needs to complete [facility] financial assistance application which was given to him today.  Appointment scheduled for 04/14/21 at 10 am at care connect office at [facility].  No further needs at this time.  Francee Nodal RN [facility]

## 2021-04-10 NOTE — Progress Notes (Signed)
BP 122/84   Pulse 91   Temp 98.3 F (36.8 C)   Ht 5\' 8"  (1.727 m)   Wt 121 lb (54.9 kg)   SpO2 96%   BMI 18.40 kg/m    Subjective:    Patient ID: Alan [name], male    DOB: [birth year], 55 y.o.   MRN: [id number]  HPI: Alan [name] is a 55 y.o. male presenting on 04/10/2021 for New Patient (Initial Visit)   HPI    Pt had a negative covid 19 screening questionnaire.     Pt is 54yoM who presents to establish care.    He has Not worked in > 2 years.  He did temp work, mostly in 06/10/2021.  Pt says only problem is chronic pain.    He has applied for disability.  He was denied the first time and he is in process with a second attempt.   He says all of his joints hurt.   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  Pt says he has Never had colon cancer screening.  Both parents had DM.  He is a smoker and has not gotten covid vaccination.    Knot on back of neck for 5 or 6 months.  Size mostly unchanged.  Non-painful.    Pt denies CP, SOB, abdominal pains.    Relevant past medical, surgical, family and social history reviewed and updated as indicated. Interim medical history since our last visit reviewed. Allergies and medications reviewed and updated.  No current outpatient medications on file.    Review of Systems  Per HPI unless specifically indicated above     Objective:    BP 122/84   Pulse 91   Temp 98.3 F (36.8 C)   Ht 5\' 8"  (1.727 m)   Wt 121 lb (54.9 kg)   SpO2 96%   BMI 18.40 kg/m   Wt Readings from Last 3 Encounters:  04/10/21 121 lb (54.9 kg)  04/02/21 121 lb 9.6 oz (55.2 kg)  12/02/17 135 lb (61.2 kg)    Physical Exam Vitals reviewed.  Constitutional:      General: He is not in acute distress.    Appearance: He is well-developed. He is not toxic-appearing.  HENT:     Head: Normocephalic and atraumatic.     Right Ear: Tympanic membrane and ear canal normal.      Left Ear: There is impacted cerumen.  Eyes:     Extraocular Movements: Extraocular movements intact.     Conjunctiva/sclera: Conjunctivae normal.     Pupils: Pupils are equal, round, and reactive to light.  Neck:     Thyroid: No thyromegaly.     Comments: Small less than 1 cm soft mobile lymph node palpable over right occipital area. Cardiovascular:     Rate and Rhythm: Normal rate and regular rhythm.  Pulmonary:     Effort: Pulmonary effort is normal.     Breath sounds: Normal breath sounds. No wheezing or rales.  Abdominal:     General: Bowel sounds are normal.     Palpations: Abdomen is soft. There is no mass.     Tenderness: There is no abdominal tenderness.  Musculoskeletal:     Cervical back: Neck supple.     Right knee: Deformity present. No swelling. Tenderness present.     Right lower leg: No edema.     Left lower leg: No edema.     Comments:  R knee with full passive ROM and decreased active ROM  Lymphadenopathy:     Cervical: No cervical adenopathy.  Skin:    General: Skin is warm and dry.     Findings: No rash.  Neurological:     Mental Status: He is alert and oriented to person, place, and time.     Motor: No tremor.     Gait: Gait abnormal.     Comments: Pt walks with a cane  Psychiatric:        Attention and Perception: Attention normal.        Speech: Speech normal.        Behavior: Behavior normal. Behavior is cooperative.       L cerumen        Assessment & Plan:    Encounter Diagnoses  Name Primary?  . Encounter to establish care Yes  . Screening for diabetes mellitus   . Family history of diabetes mellitus   . Screening cholesterol level   . Other chronic pain   . Chronic pain of right knee   . Screening for colon cancer   . Screening for prostate cancer   . Tobacco use disorder     -pt is given FIT test for colon cancer screening -will get Baseline labs -pt counseled on Smoking cessation -pt is educated on covid vaccination and  is encouraged to get his vaccination as soon as he can -will refer to Orthopedist for the right knee -pt was given application for [employer] financial assistance -discussed with pt that lymph node on back of head has characteristics in the normal range Pt to follow up 52month.  He is to contact office sooner prn

## 2021-04-12 ENCOUNTER — Other Ambulatory Visit: Payer: Self-pay | Admitting: Physician Assistant

## 2021-04-12 DIAGNOSIS — Z1211 Encounter for screening for malignant neoplasm of colon: Secondary | ICD-10-CM

## 2021-04-14 NOTE — Congregational Nurse Program (Signed)
Care coordination note updated to reflect [employer] eligibilty until 03/09/22.  Francee Nodal RN [employer]

## 2021-04-28 ENCOUNTER — Ambulatory Visit: Payer: PRIVATE HEALTH INSURANCE | Admitting: Orthopedic Surgery

## 2021-05-15 ENCOUNTER — Encounter: Payer: Self-pay | Admitting: Physician Assistant

## 2021-05-15 ENCOUNTER — Ambulatory Visit: Payer: Self-pay | Admitting: Physician Assistant

## 2021-05-15 DIAGNOSIS — Z91199 Patient's noncompliance with other medical treatment and regimen due to unspecified reason: Secondary | ICD-10-CM

## 2021-05-15 DIAGNOSIS — Z9119 Patient's noncompliance with other medical treatment and regimen: Secondary | ICD-10-CM

## 2021-05-15 NOTE — Patient Instructions (Signed)
-  Get fasting labs done -Return colon cancer screening test -covid vaccination

## 2021-05-15 NOTE — Progress Notes (Signed)
   There were no vitals taken for this visit.   Subjective:    Patient ID: Alan [name], male    DOB: 1966-10-29, 55 y.o.   MRN: [id number]  HPI: Alan [name] is a 56 y.o. male presenting on 05/15/2021 for No chief complaint on file.   HPI   Pt had a negative covid 19 screening questionnaire.   Pt is 54yoM who presents for follow up to his new patient appointment one month ago in order to review his labwork.  Pt has not yet gotten his labs drawn.  He has not returned his FIT test for colon cancer screening.  He has not gotten his covid vaccination.  He continues to smoke.  Pt was referred to orthopedics and he has an appointment to be seen next week.  Pt says he submitted his application for [employer] financial assistance.    Relevant past medical, surgical, family and social history reviewed and updated as indicated. Interim medical history since our last visit reviewed. Allergies and medications reviewed and updated.  Review of Systems  Per HPI unless specifically indicated above     Objective:    There were no vitals taken for this visit.  Wt Readings from Last 3 Encounters:  04/10/21 121 lb (54.9 kg)  04/02/21 121 lb 9.6 oz (55.2 kg)  12/02/17 135 lb (61.2 kg)    Physical Exam Constitutional:      General: He is not in acute distress. HENT:     Head: Normocephalic and atraumatic.  Pulmonary:     Effort: Pulmonary effort is normal. No respiratory distress.  Neurological:     Mental Status: He is alert and oriented to person, place, and time.  Psychiatric:        Attention and Perception: Attention normal.        Speech: Speech normal.        Behavior: Behavior normal.           Assessment & Plan:   Encounter Diagnosis  Name Primary?   Noncompliance Yes     -pt is encouraged to get his fasting labs drawn, return his FIT test for colon cancer screening and get covid vaccination -he is scheduled to follow up in 1 month to review these items.   He is to contact office sooner prn

## 2021-05-19 ENCOUNTER — Ambulatory Visit: Payer: PRIVATE HEALTH INSURANCE

## 2021-05-19 ENCOUNTER — Ambulatory Visit (INDEPENDENT_AMBULATORY_CARE_PROVIDER_SITE_OTHER): Payer: Self-pay | Admitting: Orthopedic Surgery

## 2021-05-19 ENCOUNTER — Encounter: Payer: Self-pay | Admitting: Orthopedic Surgery

## 2021-05-19 ENCOUNTER — Other Ambulatory Visit: Payer: Self-pay

## 2021-05-19 VITALS — BP 134/91 | HR 85 | Ht 69.0 in | Wt 121.4 lb

## 2021-05-19 DIAGNOSIS — S76111A Strain of right quadriceps muscle, fascia and tendon, initial encounter: Secondary | ICD-10-CM

## 2021-05-19 DIAGNOSIS — M25561 Pain in right knee: Secondary | ICD-10-CM

## 2021-05-19 DIAGNOSIS — G8929 Other chronic pain: Secondary | ICD-10-CM

## 2021-05-19 NOTE — Progress Notes (Signed)
NEW PROBLEM//OFFICE VISIT  Summary assessment and plan:   73 male 55-year-old ruptured quadriceps tendon  MRI to image for planning surgical repair  Chief Complaint  Patient presents with   New Patient (Initial Visit)   Knee Pain    Right knee/>2 years/"dent"in middle of knee/patient injured it when he fell down some concrete stairs/seen at [facility] when he fell and was told nothing was broken   55 year old male fell down some stairs 2 years ago was seen in the emergency room evaluated told he had ligament injury but it was never treated presents now with complaints of right knee and right leg pain with some associated hip pain radiating down to his right foot.  He says he cannot extend his knee it gives out.  He says he has numbness that goes from his hip to his foot after about 5 minutes of standing and occasional back pain  No prior treatment of either his sciatica or his knee tendon rupture    MEDICAL DECISION MAKING  A.  Encounter Diagnoses  Name Primary?   Chronic pain of right knee    Rupture of right quadriceps tendon, initial encounter Yes    B. DATA ANALYSED:   IMAGING: Interpretation of images: Internal patella Baha  Orders: MRI right knee  Outside records reviewed: None   C. MANAGEMENT   To be determined  No orders of the defined types were placed in this encounter.    BP (!) 134/91   Pulse 85   Ht 5\' 9"  (1.753 m)   Wt 121 lb 6.4 oz (55.1 kg)   BMI 17.93 kg/m    General appearance: Well-developed well-nourished no gross deformities  Cardiovascular normal pulse and perfusion normal color without edema  Neurologically no sensation loss or deficits or pathologic reflexes  Psychological: Awake alert and oriented x3 mood and affect normal  Skin no lacerations or ulcerations no nodularity no palpable masses, no erythema or nodularity  Musculoskeletal:   Deformity right knee patella is severely inferior femoral condyle is easily palpated  patient cannot extend the right knee but he can flex it extreme weakness and quadriceps function   ROS Back pain hip pain radiating pain down right leg  No past medical history on file.  Past Surgical History:  Procedure Laterality Date   TONSILLECTOMY  55 yo    Family History  Problem Relation Age of Onset   Heart disease Mother    Diabetes Mother    Diabetes Father    Social History   Tobacco Use   Smoking status: Every Day    Packs/day: 1.00    Years: 30.00    Pack years: 30.00    Types: Cigarettes   Smokeless tobacco: Never   Tobacco comments:    1 pack/week   Vaping Use   Vaping Use: Never used  Substance Use Topics   Alcohol use: Yes    Comment: 12 pack/week   Drug use: Yes    Types: Marijuana, Cocaine    Comment: MJ daily .  no cocaine since 2021    No Known Allergies  No outpatient medications have been marked as taking for the 05/19/21 encounter (Office Visit) with 07/20/21, MD.        Vickki Hearing, MD  05/19/2021 3:41 PM

## 2021-05-19 NOTE — Patient Instructions (Signed)
While we are working on your approval for MRI please go ahead and call to schedule your appointment with [facility] within at least one (1) week.   Central Scheduling [phone]  

## 2021-06-12 ENCOUNTER — Ambulatory Visit (HOSPITAL_COMMUNITY)
Admission: RE | Admit: 2021-06-12 | Discharge: 2021-06-12 | Disposition: A | Discharge status: Home / Self Care | Payer: PRIVATE HEALTH INSURANCE | Source: Ambulatory Visit | Attending: Orthopedic Surgery | Admitting: Orthopedic Surgery

## 2021-06-12 ENCOUNTER — Other Ambulatory Visit: Payer: Self-pay

## 2021-06-12 DIAGNOSIS — M25561 Pain in right knee: Secondary | ICD-10-CM | POA: Insufficient documentation

## 2021-06-12 DIAGNOSIS — G8929 Other chronic pain: Secondary | ICD-10-CM | POA: Insufficient documentation

## 2021-06-16 LAB — IFOBT (OCCULT BLOOD): IFOBT: NEGATIVE

## 2021-06-23 ENCOUNTER — Ambulatory Visit: Payer: Self-pay | Admitting: Physician Assistant

## 2021-08-25 ENCOUNTER — Other Ambulatory Visit: Payer: Self-pay

## 2021-08-25 ENCOUNTER — Ambulatory Visit (INDEPENDENT_AMBULATORY_CARE_PROVIDER_SITE_OTHER): Payer: Self-pay | Admitting: Orthopedic Surgery

## 2021-08-25 ENCOUNTER — Telehealth: Payer: Self-pay | Admitting: Orthopedic Surgery

## 2021-08-25 ENCOUNTER — Encounter: Payer: Self-pay | Admitting: Orthopedic Surgery

## 2021-08-25 DIAGNOSIS — S76111D Strain of right quadriceps muscle, fascia and tendon, subsequent encounter: Secondary | ICD-10-CM

## 2021-08-25 NOTE — Telephone Encounter (Signed)
Patient presented a new [facility] card as per scanned-in documents for his visit today, 08/25/21; card indicates effective date 08/13/21 - coverage will not e-verify. Patient mentioned he also received some other paperwork which he will look up. We provided him with Social services phone number; he will call. Aware he may be best to wait for insurance to verify before having MRI

## 2021-08-25 NOTE — Progress Notes (Signed)
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

## 2021-08-25 NOTE — Patient Instructions (Signed)
please go ahead and call to schedule your appointment with [facility] within at least one (1) week for the MRI   Central Scheduling [phone]

## 2021-08-26 NOTE — Telephone Encounter (Signed)
Patient does not have insurance coverage.  I called patient and instructed him to go ahead and call and schedule MRI.  He is aware he needs to have this study done.

## 2021-08-26 NOTE — Telephone Encounter (Signed)
Can we discuss? 

## 2021-08-29 ENCOUNTER — Ambulatory Visit (HOSPITAL_COMMUNITY)
Admission: RE | Admit: 2021-08-29 | Discharge: 2021-08-29 | Disposition: A | Discharge status: Home / Self Care | Payer: Self-pay | Source: Ambulatory Visit | Attending: Orthopedic Surgery | Admitting: Orthopedic Surgery

## 2021-08-29 ENCOUNTER — Other Ambulatory Visit: Payer: Self-pay

## 2021-08-29 DIAGNOSIS — S76111D Strain of right quadriceps muscle, fascia and tendon, subsequent encounter: Secondary | ICD-10-CM | POA: Insufficient documentation

## 2021-09-01 ENCOUNTER — Encounter: Payer: Self-pay | Admitting: Orthopedic Surgery

## 2021-09-01 ENCOUNTER — Ambulatory Visit (INDEPENDENT_AMBULATORY_CARE_PROVIDER_SITE_OTHER): Payer: Self-pay | Admitting: Orthopedic Surgery

## 2021-09-01 ENCOUNTER — Other Ambulatory Visit: Payer: Self-pay

## 2021-09-01 VITALS — Ht 69.0 in | Wt 121.0 lb

## 2021-09-01 DIAGNOSIS — S76111D Strain of right quadriceps muscle, fascia and tendon, subsequent encounter: Secondary | ICD-10-CM

## 2021-09-01 NOTE — Progress Notes (Signed)
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  MRI shows 4 cm retraction with atrophy of the quadriceps musculature  Patient is interested in having reconstruction of his quadriceps tendon  Explained to him the limitations of this procedure including postoperative rehabilitation and limited motion up to 100 degrees  He says he does not want to keep his like the way it is because he cannot walk well  We will schedule the surgery for early December as  Reconstruction right quadriceps tendon with allograft plus or minus mesh  MRI was reviewed I cannot easily see the quadriceps atrophy as well as the 4 cm gap there are some strenuous fibers of tissue still attached

## 2021-09-02 ENCOUNTER — Telehealth: Payer: Self-pay | Admitting: Radiology

## 2021-09-02 NOTE — Telephone Encounter (Signed)
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  MRI shows 4 cm retraction with atrophy of the quadriceps musculature  Patient is interested in having reconstruction of his quadriceps tendon  Explained to him the limitations of this procedure including postoperative rehabilitation and limited motion up to 100 degrees  He says he does not want to keep his like the way it is because he cannot walk well  We will schedule the surgery for early December as   Reconstruction right quadriceps tendon with allograft plus or minus mesh   MRI was reviewed I cannot easily see the quadriceps atrophy as well as the 4 cm gap there are some strenuous fibers of tissue still attached     I called him to schedule surgery left message.

## 2021-09-10 ENCOUNTER — Other Ambulatory Visit: Payer: Self-pay | Admitting: Orthopedic Surgery

## 2021-09-10 DIAGNOSIS — S76111D Strain of right quadriceps muscle, fascia and tendon, subsequent encounter: Secondary | ICD-10-CM

## 2021-09-10 DIAGNOSIS — Z720 Tobacco use: Secondary | ICD-10-CM

## 2021-09-11 ENCOUNTER — Ambulatory Visit: Payer: Self-pay | Admitting: Orthopedic Surgery

## 2021-09-19 ENCOUNTER — Encounter (HOSPITAL_COMMUNITY): Payer: Self-pay | Admitting: Anesthesiology

## 2021-10-09 ENCOUNTER — Encounter (HOSPITAL_COMMUNITY)
Admission: RE | Admit: 2021-10-09 | Discharge: 2021-10-09 | Disposition: A | Discharge status: Home / Self Care | Payer: Medicaid Other | Source: Ambulatory Visit | Attending: Orthopedic Surgery | Admitting: Orthopedic Surgery

## 2021-10-09 ENCOUNTER — Other Ambulatory Visit: Payer: Self-pay

## 2021-10-09 VITALS — BP 98/72 | HR 93 | Temp 98.2°F | Resp 18 | Ht 68.0 in | Wt 125.0 lb

## 2021-10-09 DIAGNOSIS — Z01818 Encounter for other preprocedural examination: Secondary | ICD-10-CM | POA: Insufficient documentation

## 2021-10-09 DIAGNOSIS — X58XXXD Exposure to other specified factors, subsequent encounter: Secondary | ICD-10-CM | POA: Insufficient documentation

## 2021-10-09 DIAGNOSIS — S76111D Strain of right quadriceps muscle, fascia and tendon, subsequent encounter: Secondary | ICD-10-CM | POA: Insufficient documentation

## 2021-10-09 DIAGNOSIS — Z720 Tobacco use: Secondary | ICD-10-CM | POA: Insufficient documentation

## 2021-10-09 LAB — CBC WITH DIFFERENTIAL/PLATELET
Abs Immature Granulocytes: 0.02 10*3/uL (ref 0.00–0.07)
Basophils Absolute: 0.1 10*3/uL (ref 0.0–0.1)
Basophils Relative: 1 %
Eosinophils Absolute: 0.2 10*3/uL (ref 0.0–0.5)
Eosinophils Relative: 3 %
HCT: 46.2 % (ref 39.0–52.0)
Hemoglobin: 15.8 g/dL (ref 13.0–17.0)
Immature Granulocytes: 0 %
Lymphocytes Relative: 31 %
Lymphs Abs: 1.9 10*3/uL (ref 0.7–4.0)
MCH: 33.9 pg (ref 26.0–34.0)
MCHC: 34.2 g/dL (ref 30.0–36.0)
MCV: 99.1 fL (ref 80.0–100.0)
Monocytes Absolute: 0.7 10*3/uL (ref 0.1–1.0)
Monocytes Relative: 11 %
Neutro Abs: 3.3 10*3/uL (ref 1.7–7.7)
Neutrophils Relative %: 54 %
Platelets: 354 10*3/uL (ref 150–400)
RBC: 4.66 MIL/uL (ref 4.22–5.81)
RDW: 14.6 % (ref 11.5–15.5)
WBC: 6.1 10*3/uL (ref 4.0–10.5)
nRBC: 0 % (ref 0.0–0.2)

## 2021-10-09 LAB — BASIC METABOLIC PANEL
Anion gap: 7 (ref 5–15)
BUN: 9 mg/dL (ref 6–20)
CO2: 27 mmol/L (ref 22–32)
Calcium: 8.9 mg/dL (ref 8.9–10.3)
Chloride: 107 mmol/L (ref 98–111)
Creatinine, Ser: 0.85 mg/dL (ref 0.61–1.24)
GFR, Estimated: >60 mL/min (ref >60)
Glucose, Bld: 104 mg/dL — ABNORMAL HIGH (ref 70–99)
Potassium: 4.1 mmol/L (ref 3.5–5.1)
Sodium: 141 mmol/L (ref 135–145)

## 2021-10-09 LAB — RAPID URINE DRUG SCREEN, HOSP PERFORMED
Amphetamines: NOT DETECTED
Barbiturates: NOT DETECTED
Benzodiazepines: NOT DETECTED
Cocaine: POSITIVE — AB
Opiates: NOT DETECTED
Tetrahydrocannabinol: POSITIVE — AB

## 2021-10-09 NOTE — Patient Instructions (Signed)
Alan [name]  10/09/2021     @   Your procedure is scheduled on  10/14/2021.   Report to [facility] at  0825  A.M.   Call this number if you have problems the morning of surgery:  [phone]   Remember:  Do not eat or drink after midnight.      Take these medicines the morning of surgery with A SIP OF WATER                                 pepcid.     Do not wear jewelry, make-up or nail polish.  Do not wear lotions, powders, or perfumes, or deodorant.  Do not shave 48 hours prior to surgery.  Men may shave face and neck.  Do not bring valuables to the hospital.  [facility] is not responsible for any belongings or valuables.  Contacts, dentures or bridgework may not be worn into surgery.  Leave your suitcase in the car.  After surgery it may be brought to your room.  For patients admitted to the hospital, discharge time will be determined by your treatment team.  Patients discharged the day of surgery will not be allowed to drive home and must have someone with them for 24 hours.    Special instructions:   DO NOT smoke tobacco or vape for 24 hours before your procedure.  Please read over the following fact sheets that you were given. Coughing and Deep Breathing, Surgical Site Infection Prevention, Anesthesia Post-op Instructions, and Care and Recovery After Surgery      Tendon Repair, Care After This sheet gives you information about how to care for yourself after your procedure. Your health care provider may also give you more specific instructions. If you have problems or questions, contact your health care provider. What can I expect after the procedure? After the procedure, it is common to have: Soreness or pain. Stiffness. Limited range of motion in the joint where the tendon is repaired. Follow these instructions at home: If you have a splint or brace: Wear the splint or brace as told by your health care provider. Remove  it only as told by your health care provider. Loosen the splint or brace if your fingers or toes tingle, become numb, or turn cold and blue. Keep the splint or brace clean and dry. If you have a cast: Do not put pressure on any part of the cast until it is fully hardened. Do not stick anything inside the cast to scratch your skin. Doing that increases your risk of infection. Check the skin around the cast every day. Tell your health care provider about any concerns. You may put lotion on dry skin around the edges of the cast. Do not put lotion on the skin underneath the cast. Keep the cast clean and dry. Bathing Do not take baths, swim, or use a hot tub until your health care provider approves. Ask your health care provider if you may take showers. You may only be allowed to take sponge baths. If your splint, brace, or cast is not waterproof: Do not let it get wet. Cover it with a watertight covering when you take a bath or a shower. Keep the bandage (dressing) dry until your health care provider says it can be removed. Medicines Take over-the-counter and prescription medicines only as told by your health care provider.  Do not drive or use heavy machinery while taking prescription pain medicine. Incision care  Follow instructions from your health care provider about how to take care of your incision. Make sure you: Wash your hands with soap and water before you change your dressing. If soap and water are not available, use hand sanitizer. Change your dressing as told by your health care provider. Leave stitches (sutures), skin glue, or adhesive strips in place. These skin closures may need to stay in place for 2 weeks or longer. If adhesive strip edges start to loosen and curl up, you may trim the loose edges. Do not remove adhesive strips completely unless your health care provider tells you to do that. Check your incision area every day for signs of infection. Check for: Redness, swelling,  or pain. Fluid or blood. Warmth. Pus or a bad smell. Managing pain, stiffness, and swelling  If directed, put ice on the affected area: If you have a removable splint or brace, remove it as told by your health care provider. Put ice in a plastic bag. Place a towel between your skin and the bag, or between your cast and the bag. Leave the ice on for 20 minutes, 2-3 times a day. Move your fingers or toes often to avoid stiffness and to lessen swelling. Raise (elevate) the injured area above the level of your heart while you are sitting or lying down. Activity Rest as told by your health care provider. Ask your health care provider what activities are safe for you during recovery, and ask what activities you need to avoid. Do not lift anything that is heavier than 10 lb (4.5 kg), or the limit that you are told, until your health care provider says that it is safe. Do not use the injured limb to support your body weight until your health care provider says that you can. If physical therapy was prescribed, do exercises as directed. Doing exercises may help to improve movement and flexibility (range of motion). General instructions Do not use any products that contain nicotine or tobacco, such as cigarettes and e-cigarettes. These can delay tendon healing. If you need help quitting, ask your health care provider. Ask your health care provider when it is safe to drive if you have a splint, brace, sling, or cast on your arm or leg. If you are taking prescription pain medicine, take actions to prevent or treat constipation. Your health care provider may recommend that you: Drink enough fluid to keep your urine pale yellow. Eat foods that are high in fiber, such as fresh fruits and vegetables, whole grains, and beans. Limit foods that are high in fat and processed sugars, such as fried or sweet foods. Take an over-the-counter or prescription medicine for constipation. Keep all follow-up visits as  told by your health care provider. This is important. Contact a health care provider if: You have redness, swelling, or pain at your incision site. You have fluid or blood coming from your incision site. Your incision feels warm to the touch. You have pus coming from your incision site. You have a bad smell coming from: The incision site or dressing. Underneath your cast or splint. You have a fever. Your stiffness or movement is not improving. Get help right away if: You have trouble breathing. You have chest pain. Your heart beats more quickly than normal, or you feel it skipping beats. You have severe pain. Summary After the procedure, it is common to have pain, stiffness, and limited range of  motion. If your splint, brace, or cast is not waterproof, do not let it get wet. Contact your health care provider if you have blood, fluid, or pus coming from your incision site. This information is not intended to replace advice given to you by your health care provider. Make sure you discuss any questions you have with your health care provider. Document Revised: 07/16/2020 Document Reviewed: 07/19/2020 Elsevier Patient Education  2022 [employer] General Anesthesia, Adult, Care After This sheet gives you information about how to care for yourself after your procedure. Your health care provider may also give you more specific instructions. If you have problems or questions, contact your health care provider. What can I expect after the procedure? After the procedure, the following side effects are common: Pain or discomfort at the IV site. Nausea. Vomiting. Sore throat. Trouble concentrating. Feeling cold or chills. Feeling weak or tired. Sleepiness and fatigue. Soreness and body aches. These side effects can affect parts of the body that were not involved in surgery. Follow these instructions at home: For the time period you were told by your health care provider:  Rest. Do not  participate in activities where you could fall or become injured. Do not drive or use machinery. Do not drink alcohol. Do not take sleeping pills or medicines that cause drowsiness. Do not make important decisions or sign legal documents. Do not take care of children on your own. Eating and drinking Follow any instructions from your health care provider about eating or drinking restrictions. When you feel hungry, start by eating small amounts of foods that are soft and easy to digest (bland), such as toast. Gradually return to your regular diet. Drink enough fluid to keep your urine pale yellow. If you vomit, rehydrate by drinking water, juice, or clear broth. General instructions If you have sleep apnea, surgery and certain medicines can increase your risk for breathing problems. Follow instructions from your health care provider about wearing your sleep device: Anytime you are sleeping, including during daytime naps. While taking prescription pain medicines, sleeping medicines, or medicines that make you drowsy. Have a responsible adult stay with you for the time you are told. It is important to have someone help care for you until you are awake and alert. Return to your normal activities as told by your health care provider. Ask your health care provider what activities are safe for you. Take over-the-counter and prescription medicines only as told by your health care provider. If you smoke, do not smoke without supervision. Keep all follow-up visits as told by your health care provider. This is important. Contact a health care provider if: You have nausea or vomiting that does not get better with medicine. You cannot eat or drink without vomiting. You have pain that does not get better with medicine. You are unable to pass urine. You develop a skin rash. You have a fever. You have redness around your IV site that gets worse. Get help right away if: You have difficulty breathing. You  have chest pain. You have blood in your urine or stool, or you vomit blood. Summary After the procedure, it is common to have a sore throat or nausea. It is also common to feel tired. Have a responsible adult stay with you for the time you are told. It is important to have someone help care for you until you are awake and alert. When you feel hungry, start by eating small amounts of foods that are soft and easy to  digest (bland), such as toast. Gradually return to your regular diet. Drink enough fluid to keep your urine pale yellow. Return to your normal activities as told by your health care provider. Ask your health care provider what activities are safe for you. This information is not intended to replace advice given to you by your health care provider. Make sure you discuss any questions you have with your health care provider. Document Revised: 07/11/2020 Document Reviewed: 02/08/2020 Elsevier Patient Education  2022 [employer] How to Use Chlorhexidine for Bathing Chlorhexidine gluconate (CHG) is a germ-killing (antiseptic) solution that is used to clean the skin. It can get rid of the bacteria that normally live on the skin and can keep them away for about 24 hours. To clean your skin with CHG, you may be given: A CHG solution to use in the shower or as part of a sponge bath. A prepackaged cloth that contains CHG. Cleaning your skin with CHG may help lower the risk for infection: While you are staying in the intensive care unit of the hospital. If you have a vascular access, such as a central line, to provide short-term or long-term access to your veins. If you have a catheter to drain urine from your bladder. If you are on a ventilator. A ventilator is a machine that helps you breathe by moving air in and out of your lungs. After surgery. What are the risks? Risks of using CHG include: A skin reaction. Hearing loss, if CHG gets in your ears and you have a perforated eardrum. Eye  injury, if CHG gets in your eyes and is not rinsed out. The CHG product catching fire. Make sure that you avoid smoking and flames after applying CHG to your skin. Do not use CHG: If you have a chlorhexidine allergy or have previously reacted to chlorhexidine. On babies younger than 71 months of age. How to use CHG solution Use CHG only as told by your health care provider, and follow the instructions on the label. Use the full amount of CHG as directed. Usually, this is one bottle. During a shower Follow these steps when using CHG solution during a shower (unless your health care provider gives you different instructions): Start the shower. Use your normal soap and shampoo to wash your face and hair. Turn off the shower or move out of the shower stream. Pour the CHG onto a clean washcloth. Do not use any type of brush or rough-edged sponge. Starting at your neck, lather your body down to your toes. Make sure you follow these instructions: If you will be having surgery, pay special attention to the part of your body where you will be having surgery. Scrub this area for at least 1 minute. Do not use CHG on your head or face. If the solution gets into your ears or eyes, rinse them well with water. Avoid your genital area. Avoid any areas of skin that have broken skin, cuts, or scrapes. Scrub your back and under your arms. Make sure to wash skin folds. Let the lather sit on your skin for 1-2 minutes or as long as told by your health care provider. Thoroughly rinse your entire body in the shower. Make sure that all body creases and crevices are rinsed well. Dry off with a clean towel. Do not put any substances on your body afterward--such as powder, lotion, or perfume--unless you are told to do so by your health care provider. Only use lotions that are recommended by the manufacturer.  Put on clean clothes or pajamas. If it is the night before your surgery, sleep in clean sheets.  During a  sponge bath Follow these steps when using CHG solution during a sponge bath (unless your health care provider gives you different instructions): Use your normal soap and shampoo to wash your face and hair. Pour the CHG onto a clean washcloth. Starting at your neck, lather your body down to your toes. Make sure you follow these instructions: If you will be having surgery, pay special attention to the part of your body where you will be having surgery. Scrub this area for at least 1 minute. Do not use CHG on your head or face. If the solution gets into your ears or eyes, rinse them well with water. Avoid your genital area. Avoid any areas of skin that have broken skin, cuts, or scrapes. Scrub your back and under your arms. Make sure to wash skin folds. Let the lather sit on your skin for 1-2 minutes or as long as told by your health care provider. Using a different clean, wet washcloth, thoroughly rinse your entire body. Make sure that all body creases and crevices are rinsed well. Dry off with a clean towel. Do not put any substances on your body afterward--such as powder, lotion, or perfume--unless you are told to do so by your health care provider. Only use lotions that are recommended by the manufacturer. Put on clean clothes or pajamas. If it is the night before your surgery, sleep in clean sheets. How to use CHG prepackaged cloths Only use CHG cloths as told by your health care provider, and follow the instructions on the label. Use the CHG cloth on clean, dry skin. Do not use the CHG cloth on your head or face unless your health care provider tells you to. When washing with the CHG cloth: Avoid your genital area. Avoid any areas of skin that have broken skin, cuts, or scrapes. Before surgery Follow these steps when using a CHG cloth to clean before surgery (unless your health care provider gives you different instructions): Using the CHG cloth, vigorously scrub the part of your body  where you will be having surgery. Scrub using a back-and-forth motion for 3 minutes. The area on your body should be completely wet with CHG when you are done scrubbing. Do not rinse. Discard the cloth and let the area air-dry. Do not put any substances on the area afterward, such as powder, lotion, or perfume. Put on clean clothes or pajamas. If it is the night before your surgery, sleep in clean sheets.  For general bathing Follow these steps when using CHG cloths for general bathing (unless your health care provider gives you different instructions). Use a separate CHG cloth for each area of your body. Make sure you wash between any folds of skin and between your fingers and toes. Wash your body in the following order, switching to a new cloth after each step: The front of your neck, shoulders, and chest. Both of your arms, under your arms, and your hands. Your stomach and groin area, avoiding the genitals. Your right leg and foot. Your left leg and foot. The back of your neck, your back, and your buttocks. Do not rinse. Discard the cloth and let the area air-dry. Do not put any substances on your body afterward--such as powder, lotion, or perfume--unless you are told to do so by your health care provider. Only use lotions that are recommended by the manufacturer. Put on  clean clothes or pajamas. Contact a health care provider if: Your skin gets irritated after scrubbing. You have questions about using your solution or cloth. You swallow any chlorhexidine. Call your local poison control center ([phone] in the [place] Get help right away if: Your eyes itch badly, or they become very red or swollen. Your skin itches badly and is red or swollen. Your hearing changes. You have trouble seeing. You have swelling or tingling in your mouth or throat. You have trouble breathing. These symptoms may represent a serious problem that is an emergency. Do not wait to see if the symptoms will go  away. Get medical help right away. Call your local emergency services (911 in the [place] Do not drive yourself to the hospital. Summary Chlorhexidine gluconate (CHG) is a germ-killing (antiseptic) solution that is used to clean the skin. Cleaning your skin with CHG may help to lower your risk for infection. You may be given CHG to use for bathing. It may be in a bottle or in a prepackaged cloth to use on your skin. Carefully follow your health care provider's instructions and the instructions on the product label. Do not use CHG if you have a chlorhexidine allergy. Contact your health care provider if your skin gets irritated after scrubbing. This information is not intended to replace advice given to you by your health care provider. Make sure you discuss any questions you have with your health care provider. Document Revised: 01/06/2021 Document Reviewed: 01/06/2021 Elsevier Patient Education  2022 [employer].

## 2021-10-13 ENCOUNTER — Telehealth: Payer: Self-pay | Admitting: Radiology

## 2021-10-13 NOTE — Telephone Encounter (Signed)
-----   Message from [facility] May, RT sent at 10/10/2021  9:55 AM EST ----- Regarding: FW: cancel I called patient and advised surgery being cancelled and why.    I did not edit epic or your schedule book Jameya Pontiff, will defer to you to do.   I also told patient that someone would call him Monday to discuss further, and what this means for any reschedule, etc.  Not sure what the protocol is.    ----- Message ----- From: Elsie Amis, RN Sent: 10/10/2021   8:05 AM EST To: Caffie Damme, RT Subject: cancel                                         Good morning! 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 I will let Eber Jones know if you will call the patient please. Thank you!

## 2021-10-14 ENCOUNTER — Ambulatory Visit: Admit: 2021-10-14 | Payer: Medicaid Other | Admitting: Orthopedic Surgery

## 2021-10-14 SURGERY — REPAIR, TENDON, QUADRICEPS
Anesthesia: Choice | Laterality: Right

## 2022-01-30 ENCOUNTER — Ambulatory Visit: Payer: Self-pay | Admitting: Family Medicine

## 2022-02-20 ENCOUNTER — Ambulatory Visit: Payer: Medicaid Other | Admitting: Family Medicine

## 2022-03-06 ENCOUNTER — Ambulatory Visit: Payer: Medicaid Other | Admitting: Family Medicine

## 2022-03-18 ENCOUNTER — Ambulatory Visit: Payer: Medicaid Other | Admitting: Family Medicine

## 2022-04-13 ENCOUNTER — Ambulatory Visit (INDEPENDENT_AMBULATORY_CARE_PROVIDER_SITE_OTHER): Payer: Self-pay | Admitting: Nurse Practitioner

## 2022-04-13 ENCOUNTER — Encounter: Payer: Self-pay | Admitting: Nurse Practitioner

## 2022-04-13 VITALS — BP 121/87 | HR 80 | Ht 68.0 in | Wt 118.8 lb

## 2022-04-13 DIAGNOSIS — G8929 Other chronic pain: Secondary | ICD-10-CM | POA: Insufficient documentation

## 2022-04-13 DIAGNOSIS — M25561 Pain in right knee: Secondary | ICD-10-CM

## 2022-04-13 DIAGNOSIS — M25511 Pain in right shoulder: Secondary | ICD-10-CM

## 2022-04-13 MED ORDER — IBUPROFEN 600 MG PO TABS: 600.0000 mg | ORAL_TABLET | Freq: Three times a day (TID) | ORAL | 0 refills | Status: DC | PRN

## 2022-04-13 MED ORDER — METHOCARBAMOL 500 MG PO TABS: 500.0000 mg | ORAL_TABLET | Freq: Four times a day (QID) | ORAL | 2 refills | Status: DC

## 2022-04-13 NOTE — Progress Notes (Signed)
New Patient Note  RE: Alan [name] MRN: [id number] DOB: 1966-07-24 Date of Office Visit: 04/13/2022  Chief Complaint: Establish Care, Knee Pain (Right knee - fell 2  years ago), and Shoulder Injury (Right shoulder , fell 2 years ago)  History of Present Illness:   Knee Pain: Patient presents for follow up on a knee problem involving the  right knee. Onset of the symptoms was  years ago . Inciting event: injured during a fall while walking down concrete steps . Current symptoms include pain located right leg, thigh and stiffness. Pain is aggravated by walking.  Patient has had no prior knee problems. Evaluation to date:  hospital intervention during initial incidence . Treatment to date: avoidance of offending activity, brace which is not very effective, ice, and OTC analgesics which are somewhat effective.   Shoulder Pain: Patient complaints of right shoulder pain. This is evaluated as a personal injury. The pain is described as aching, sharp, and shooting.  The onset of the pain was  2 years ago after a fall on concrete steps .  The pain occurs continuously and lasts 3 years.  Location is lateral. No history of dislocation. Symptoms are aggravated by reaching, lifting, pulling, carrying, throwing, twisting. Symptoms are diminished by  rest, the use of sling.   Limited activities include: all activities, reaching, lifting, pulling, pushing. no crepitus noted is reported. Patient is not working at this time due to injury.     Assessment and Plan: Alan [name] is a 56 y.o. male with: Right knee pain Right knee pain symptoms not well controlled.  This is chronic in the past 2 years after a fall accident on concrete steps.  I started patient on Robaxin for muscle spasm and pain, ibuprofen 600 mg tablet by mouth.  Completed referral to orthopedic.  Follow-up with worsening unresolved symptoms.  Chronic right shoulder pain Right shoulder pain symptoms not well controlled.  This is chronic in the past  2 years after a fall accident on concrete steps.  I started patient on Robaxin for muscle spasm and pain, ibuprofen 600 mg tablet by mouth.  Completed referral to orthopedic.  Follow-up with worsening unresolved symptoms.  Return if symptoms worsen or fail to improve.   Diagnostics:   Past Medical History: Patient Active Problem List   Diagnosis Date Noted   Right knee pain 04/13/2022   Chronic right shoulder pain 04/13/2022   History reviewed. No pertinent past medical history. Past Surgical History: Past Surgical History:  Procedure Laterality Date   TONSILLECTOMY  56 yo   Medication List:  Current Outpatient Medications  Medication Sig Dispense Refill   acetaminophen (TYLENOL) 500 MG tablet Take 250-500 mg by mouth every 6 (six) hours as needed for moderate pain or headache.     aspirin-acetaminophen-caffeine (EXCEDRIN MIGRAINE) 250-250-65 MG tablet Take 1 tablet by mouth every 6 (six) hours as needed for headache.     famotidine-calcium carbonate-magnesium hydroxide (PEPCID COMPLETE) 10-800-165 MG chewable tablet Chew 1 tablet by mouth daily as needed (acid reflux).     ibuprofen (ADVIL) 600 MG tablet Take 1 tablet (600 mg total) by mouth every 8 (eight) hours as needed. 30 tablet 0   Menthol, Topical Analgesic, (BIOFREEZE EX) Apply 1 application topically daily as needed (pain).     methocarbamol (ROBAXIN) 500 MG tablet Take 1 tablet (500 mg total) by mouth 4 (four) times daily. 30 tablet 2   Tetrahydrozoline HCl (VISINE OP) Place 1 drop into both eyes daily as needed (dry  eyes).     No current facility-administered medications for this visit.   Allergies: No Known Allergies Social History: Social History   Socioeconomic History   Marital status: Divorced    Spouse name: Not on file   Number of children: Not on file   Years of education: Not on file   Highest education level: Not on file  Occupational History   Not on file  Tobacco Use   Smoking status: Every Day     Packs/day: 0.50    Years: 30.00    Pack years: 15.00    Types: Cigarettes   Smokeless tobacco: Never   Tobacco comments:    1 pack/week   Vaping Use   Vaping Use: Never used  Substance and Sexual Activity   Alcohol use: Yes    Comment: 12 pack/week   Drug use: Yes    Types: Marijuana, Cocaine    Comment: MJ daily .  no cocaine since 2021   Sexual activity: Yes  Other Topics Concern   Not on file  Social History Narrative   Not on file   Social Determinants of Health   Financial Resource Strain: Not on file  Food Insecurity: Not on file  Transportation Needs: Not on file  Physical Activity: Not on file  Stress: Not on file  Social Connections: Not on file       Family History: Family History  Problem Relation Age of Onset   Heart disease Mother    Diabetes Mother    Diabetes Father    Asthma Brother          Review of Systems  Constitutional: Negative.   HENT: Negative.    Eyes: Negative.   Respiratory: Negative.    Cardiovascular: Negative.   Gastrointestinal: Negative.   Endocrine: Negative.   Genitourinary: Negative.   Musculoskeletal:  Positive for joint swelling and myalgias.  Skin:  Negative for rash.  All other systems reviewed and are negative. Objective: BP 121/87   Pulse 80   Ht 5\' 8"  (1.727 m)   Wt 118 lb 12.8 oz (53.9 kg)   SpO2 95%   BMI 18.06 kg/m  Body mass index is 18.06 kg/m. Physical Exam Vitals and nursing note reviewed.  HENT:     Head: Normocephalic.     Right Ear: External ear normal.     Left Ear: External ear normal.     Nose: Nose normal.     Mouth/Throat:     Mouth: Mucous membranes are moist.  Eyes:     Conjunctiva/sclera: Conjunctivae normal.  Cardiovascular:     Rate and Rhythm: Normal rate and regular rhythm.     Pulses: Normal pulses.     Heart sounds: Normal heart sounds.  Pulmonary:     Effort: Pulmonary effort is normal.     Breath sounds: Normal breath sounds.  Abdominal:     General: Bowel sounds  are normal.  Musculoskeletal:     Right shoulder: Tenderness present. Decreased range of motion.     Right knee: Deformity present. Decreased range of motion. Tenderness present. Abnormal alignment.     Comments: Dislocated knee cap in the past.   Skin:    General: Skin is warm.  Neurological:     General: No focal deficit present.     Mental Status: He is alert and oriented to person, place, and time.  Psychiatric:        Mood and Affect: Mood normal.        Behavior:  Behavior normal.        Thought Content: Thought content normal.   The plan was reviewed with the patient/family, and all questions/concerned were addressed.  It was my pleasure to see Alan [name] today and participate in his care. Please feel free to contact me with any questions or concerns.  Sincerely,  Lynnell Chad NP [facility]

## 2022-04-13 NOTE — Assessment & Plan Note (Signed)
Right knee pain symptoms not well controlled.  This is chronic in the past 2 years after a fall accident on concrete steps.  I started patient on Robaxin for muscle spasm and pain, ibuprofen 600 mg tablet by mouth.  Completed referral to orthopedic.  Follow-up with worsening unresolved symptoms.

## 2022-04-13 NOTE — Patient Instructions (Signed)
Shoulder Pain Many things can cause shoulder pain, including: An injury. Moving the shoulder in the same way again and again (overuse). Joint pain (arthritis). Pain can come from: Swelling and irritation (inflammation) of any part of the shoulder. An injury to the shoulder joint. An injury to: Tissues that connect muscle to bone (tendons). Tissues that connect bones to each other (ligaments). Bones. Follow these instructions at home: Watch for changes in your symptoms. Let your doctor know about them. Follow these instructions to help with your pain. If you have a sling: Wear the sling as told by your doctor. Remove it only as told by your doctor. Loosen the sling if your fingers: Tingle. Become numb. Turn cold and blue. Keep the sling clean. If the sling is not waterproof: Do not let it get wet. Take the sling off when you shower or bathe. Managing pain, stiffness, and swelling  If told, put ice on the painful area: Put ice in a plastic bag. Place a towel between your skin and the bag. Leave the ice on for 20 minutes, 2-3 times a day. Stop putting ice on if it does not help with the pain. Squeeze a soft ball or a foam pad as much as possible. This prevents swelling in the shoulder. It also helps to strengthen the arm. General instructions Take over-the-counter and prescription medicines only as told by your doctor. Keep all follow-up visits as told by your doctor. This is important. Contact a doctor if: Your pain gets worse. Medicine does not help your pain. You have new pain in your arm, hand, or fingers. Get help right away if: Your arm, hand, or fingers: Tingle. Are numb. Are swollen. Are painful. Turn white or blue. Summary Shoulder pain can be caused by many things. These include injury, moving the shoulder in the same away again and again, and joint pain. Watch for changes in your symptoms. Let your doctor know about them. This condition may be treated with a  sling, ice, and pain medicine. Contact your doctor if the pain gets worse or you have new pain. Get help right away if your arm, hand, or fingers tingle or get numb, swollen, or painful. Keep all follow-up visits as told by your doctor. This is important. This information is not intended to replace advice given to you by your health care provider. Make sure you discuss any questions you have with your health care provider. Document Revised: 07/11/2021 Document Reviewed: 07/11/2021 Elsevier Patient Education  2023 Elsevier Inc. Acute Knee Pain, Adult Many things can cause knee pain. Sometimes, knee pain is sudden (acute) and may be caused by damage, swelling, or irritation of the muscles and tissues that support your knee. The pain often goes away on its own with time and rest. If the pain does not go away, tests may be done to find out what is causing the pain. Follow these instructions at home: If you have a knee sleeve or brace:  Wear the knee sleeve or brace as told by your doctor. Take it off only as told by your doctor. Loosen it if your toes: Tingle. Become numb. Turn cold and blue. Keep it clean. If the knee sleeve or brace is not waterproof: Do not let it get wet. Cover it with a watertight covering when you take a bath or shower. Activity Rest your knee. Do not do things that cause pain or make pain worse. Avoid activities where both feet leave the ground at the same time (high-impact activities).  Examples are running, jumping rope, and doing jumping jacks. Work with a physical therapist to make a safe exercise program, as told by your doctor. Managing pain, stiffness, and swelling  If told, put ice on the knee. To do this: If you have a removable knee sleeve or brace, take it off as told by your doctor. Put ice in a plastic bag. Place a towel between your skin and the bag. Leave the ice on for 20 minutes, 2-3 times a day. Take off the ice if your skin turns bright red.  This is very important. If you cannot feel pain, heat, or cold, you have a greater risk of damage to the area. If told, use an elastic bandage to put pressure (compression) on your injured knee. Raise your knee above the level of your heart while you are sitting or lying down. Sleep with a pillow under your knee. General instructions Take over-the-counter and prescription medicines only as told by your doctor. Do not smoke or use any products that contain nicotine or tobacco. If you need help quitting, ask your doctor. If you are overweight, work with your doctor and a food expert (dietitian) to set goals to lose weight. Being overweight can make your knee hurt more. Watch for any changes in your symptoms. Keep all follow-up visits. Contact a doctor if: The knee pain does not stop. The knee pain changes or gets worse. You have a fever along with knee pain. Your knee is red or feels warm when you touch it. Your knee gives out or locks up. Get help right away if: Your knee swells, and the swelling gets worse. You cannot move your knee. You have very bad knee pain that does not get better with pain medicine. Summary Many things can cause knee pain. The pain often goes away on its own with time and rest. Your doctor may do tests to find out the cause of the pain. Watch for any changes in your symptoms. Relieve your pain with rest, medicines, light activity, and use of ice. Get help right away if you cannot move your knee or your knee pain is very bad. This information is not intended to replace advice given to you by your health care provider. Make sure you discuss any questions you have with your health care provider. Document Revised: 04/10/2020 Document Reviewed: 04/10/2020 Elsevier Patient Education  2023 [employer].

## 2022-04-13 NOTE — Assessment & Plan Note (Signed)
Right shoulder pain symptoms not well controlled.  This is chronic in the past 2 years after a fall accident on concrete steps.  I started patient on Robaxin for muscle spasm and pain, ibuprofen 600 mg tablet by mouth.  Completed referral to orthopedic.  Follow-up with worsening unresolved symptoms.

## 2023-03-19 ENCOUNTER — Ambulatory Visit: Payer: Medicaid Other | Admitting: Family Medicine

## 2023-04-01 ENCOUNTER — Ambulatory Visit: Payer: Medicaid Other | Admitting: Family Medicine

## 2023-04-01 ENCOUNTER — Encounter: Payer: Self-pay | Admitting: Family Medicine

## 2023-04-01 ENCOUNTER — Ambulatory Visit (INDEPENDENT_AMBULATORY_CARE_PROVIDER_SITE_OTHER): Payer: Medicaid Other

## 2023-04-01 VITALS — BP 119/80 | HR 104 | Temp 97.5°F | Ht 68.0 in | Wt 115.2 lb

## 2023-04-01 DIAGNOSIS — R000 Tachycardia, unspecified: Secondary | ICD-10-CM

## 2023-04-01 DIAGNOSIS — Z720 Tobacco use: Secondary | ICD-10-CM | POA: Diagnosis not present

## 2023-04-01 DIAGNOSIS — R053 Chronic cough: Secondary | ICD-10-CM | POA: Diagnosis not present

## 2023-04-01 DIAGNOSIS — Z136 Encounter for screening for cardiovascular disorders: Secondary | ICD-10-CM

## 2023-04-01 DIAGNOSIS — Z23 Encounter for immunization: Secondary | ICD-10-CM

## 2023-04-01 DIAGNOSIS — R0989 Other specified symptoms and signs involving the circulatory and respiratory systems: Secondary | ICD-10-CM

## 2023-04-01 DIAGNOSIS — Z1211 Encounter for screening for malignant neoplasm of colon: Secondary | ICD-10-CM

## 2023-04-01 DIAGNOSIS — Z113 Encounter for screening for infections with a predominantly sexual mode of transmission: Secondary | ICD-10-CM

## 2023-04-01 DIAGNOSIS — G8929 Other chronic pain: Secondary | ICD-10-CM

## 2023-04-01 DIAGNOSIS — Z1331 Encounter for screening for depression: Secondary | ICD-10-CM

## 2023-04-01 DIAGNOSIS — Z122 Encounter for screening for malignant neoplasm of respiratory organs: Secondary | ICD-10-CM

## 2023-04-01 DIAGNOSIS — M25561 Pain in right knee: Secondary | ICD-10-CM

## 2023-04-01 DIAGNOSIS — E559 Vitamin D deficiency, unspecified: Secondary | ICD-10-CM

## 2023-04-01 NOTE — Progress Notes (Signed)
New Patient Office Visit  Subjective   Patient ID: Alan [name], male    DOB: [birth year]  Age: 57 y.o. MRN: [id number]  CC:  Chief Complaint  Patient presents with   Establish Care   HPI Alan [name] presents to establish care Main concern today is continued knee pain. Reports that he has had knee problems for several years. States his knee cap is "split in two" States that his right knee cap buckled under him yesterday and he fell. Reports that he hyperextended his left knee and continues to have pain. Has seen Dr. Romeo Apple in [place] for ortho in 2022, who recommended surgery for his right knee. Patient states that he did not complete surgery because he was scared to.   Additionally reports that he has had difficulty breathing the last 3 months. Mainly with activity. Lives upstairs and has to stop often on the way up to catch his breath. Endorses cough, congestion. States that he wakes up in the middle of the night coughing and short of breath. He sleeps in the recliner.   Endorses tobacco use. Has some interest in quitting tobacco. 1 PPD.  Endorses alcohol use. Over 6 pack per day.   Outpatient Encounter Medications as of 04/01/2023  Medication Sig   acetaminophen (TYLENOL) 500 MG tablet Take 250-500 mg by mouth every 6 (six) hours as needed for moderate pain or headache.   aspirin-acetaminophen-caffeine (EXCEDRIN MIGRAINE) 250-250-65 MG tablet Take 1 tablet by mouth every 6 (six) hours as needed for headache.   famotidine-calcium carbonate-magnesium hydroxide (PEPCID COMPLETE) 10-800-165 MG chewable tablet Chew 1 tablet by mouth daily as needed (acid reflux).   ibuprofen (ADVIL) 600 MG tablet Take 1 tablet (600 mg total) by mouth every 8 (eight) hours as needed.   Menthol, Topical Analgesic, (BIOFREEZE EX) Apply 1 application topically daily as needed (pain).   Tetrahydrozoline HCl (VISINE OP) Place 1 drop into both eyes daily as needed (dry eyes).   [DISCONTINUED]  methocarbamol (ROBAXIN) 500 MG tablet Take 1 tablet (500 mg total) by mouth 4 (four) times daily.   No facility-administered encounter medications on file as of 04/01/2023.    History reviewed. No pertinent past medical history.  Past Surgical History:  Procedure Laterality Date   TONSILLECTOMY  57 yo    Family History  Problem Relation Age of Onset   Heart disease Mother    Diabetes Mother    Diabetes Father    Asthma Brother     Social History   Socioeconomic History   Marital status: Divorced    Spouse name: Not on file   Number of children: Not on file   Years of education: Not on file   Highest education level: Not on file  Occupational History   Not on file  Tobacco Use   Smoking status: Every Day    Packs/day: 0.50    Years: 30.00    Additional pack years: 0.00    Total pack years: 15.00    Types: Cigarettes   Smokeless tobacco: Never   Tobacco comments:    1 pack/week   Vaping Use   Vaping Use: Never used  Substance and Sexual Activity   Alcohol use: Yes    Comment: 12 pack/week   Drug use: Yes    Types: Marijuana, Cocaine    Comment: MJ daily .  no cocaine since 2021   Sexual activity: Yes  Other Topics Concern   Not on file  Social History Narrative   Not  on file   Social Determinants of Health   Financial Resource Strain: Not on file  Food Insecurity: Not on file  Transportation Needs: Not on file  Physical Activity: Not on file  Stress: Not on file  Social Connections: Not on file  Intimate Partner Violence: Not on file   ROS As per HPI  Objective   BP 119/80   Pulse (!) 104   Temp (!) 97.5 F (36.4 C)   Ht 5\' 8"  (1.727 m)   Wt 115 lb 3.2 oz (52.3 kg)   SpO2 92%   BMI 17.52 kg/m   Physical Exam Constitutional:      General: He is awake. He is not in acute distress.    Appearance: He is well-developed, well-groomed and underweight. He is ill-appearing. He is not toxic-appearing or diaphoretic.     Comments: Chronically ill  appearing   Neck:     Thyroid: No thyromegaly.     Trachea: Trachea and phonation normal.  Cardiovascular:     Rate and Rhythm: Normal rate and regular rhythm.     Pulses:          Radial pulses are 2+ on the right side and 2+ on the left side.       Posterior tibial pulses are 2+ on the right side and 2+ on the left side.     Heart sounds: Heart sounds are distant.  Pulmonary:     Effort: Tachypnea present. No bradypnea, accessory muscle usage, prolonged expiration or respiratory distress.     Breath sounds: Decreased air movement present. Examination of the right-upper field reveals decreased breath sounds. Examination of the left-upper field reveals decreased breath sounds. Examination of the right-middle field reveals decreased breath sounds and rales. Examination of the left-middle field reveals decreased breath sounds and rales. Examination of the right-lower field reveals decreased breath sounds and rales. Examination of the left-lower field reveals decreased breath sounds and rales. Decreased breath sounds and rales present. No wheezing or rhonchi.     Comments: DOE  Musculoskeletal:     Cervical back: Full passive range of motion without pain and neck supple.     Right knee: Deformity, erythema and ecchymosis present. Decreased range of motion. Abnormal alignment.     Left knee: Decreased range of motion.     Right lower leg: 1+ Pitting Edema present.     Left lower leg: 1+ Pitting Edema present.     Comments: Walks with a cane   Lymphadenopathy:     Cervical: No cervical adenopathy.  Neurological:     General: No focal deficit present.     Mental Status: He is alert and easily aroused.  Psychiatric:        Attention and Perception: Attention and perception normal.        Mood and Affect: Mood and affect normal.        Speech: Speech normal.        Behavior: Behavior is cooperative.        Thought Content: Thought content normal.        Cognition and Memory: Cognition and  memory normal.        Judgment: Judgment normal.       04/01/2023    3:06 PM 04/01/2023    2:52 PM 04/13/2022    2:47 PM  Depression screen PHQ 2/9  Decreased Interest 1 0 3  Down, Depressed, Hopeless 2 0 2  PHQ - 2 Score 3 0 5  Altered sleeping  2  1  Tired, decreased energy 3    Change in appetite 1  1  Feeling bad or failure about yourself  1  0  Trouble concentrating 0  0  Moving slowly or fidgety/restless 1  0  Suicidal thoughts 0  0  PHQ-9 Score 11  7  Difficult doing work/chores Somewhat difficult  Very difficult      04/01/2023    3:06 PM 04/13/2022    2:48 PM  GAD 7 : Generalized Anxiety Score  Nervous, Anxious, on Edge 1 0  Control/stop worrying 1 1  Worry too much - different things 1 1  Trouble relaxing 1 1  Restless 1 1  Easily annoyed or irritable 1 1  Afraid - awful might happen 1 1  Total GAD 7 Score 7 6  Anxiety Difficulty Somewhat difficult    Assessment & Plan:  1. Chronic cough Labs as below. Will communicate results to patient once available.  Provided patient with Airsupra sample.  - CBC with Differential/Platelet; Future - CMP14+EGFR; Future  2. Bibasilar crackles Labs as below. Will communicate results to patient once available.  Provided patient with Airsupra sample. Differentials include infectious and fluid overload etiologies. Cannot rule out malignancy as patient is chronic smoker and has not completed screening.  - DG Chest 2 View; Future - Ambulatory referral to Cardiology  3. Tachycardia EKG reviewed by myself in clinic today. Concerning for chronic cardiac changes and pulmonary disease. Urgent referral to cardiology placed as below given symptomology  - EKG 12-Lead - Ambulatory referral to Cardiology  4. Tobacco abuse disorder Labs as below. Will communicate results to patient once available.  - TSH; Future - VITAMIN D 25 Hydroxy (Vit-D Deficiency, Fractures); Future  5. Encounter for screening for cardiovascular disorders Labs as  below. Will communicate results to patient once available.  Not fasting. Planned to collect at future visit. Given concern for disease processes not yet diagnosed, will collect labs today.  - Lipid panel; Future  6. Routine screening for STI (sexually transmitted infection) Labs as below. Will communicate results to patient once available.  - RPR; Future - HepB+HepC+HIV Panel; Future  7. Screening for colon cancer - Ambulatory referral to Gastroenterology  8. Screening for lung cancer - Ambulatory Referral Lung Cancer Screening [facility]  9. Chronic pain of right knee Established with ortho. Instructed patient to follow up with ortho.   10. Need for shingles vaccine - Zoster Recombinant (Shingrix )  11. Need for Tdap vaccination - Tdap vaccine greater than or equal to 7yo IM  12. Encounter for screening for depression Screened positively for depression and anxiety today. Denies SI. Will have close follow up for management.   The above assessment and management plan was discussed with the patient. The patient verbalized understanding of and has agreed to the management plan using shared-decision making. Patient is aware to call the clinic if they develop any new symptoms or if symptoms fail to improve or worsen. Patient is aware when to return to the clinic for a follow-up visit. Patient educated on when it is appropriate to go to the emergency department.   Return in about 4 weeks (around 04/29/2023) for Chronic Condition Follow up.   Neale Burly, DNP-FNP [facility] [address] [place], [place] [address] [phone]

## 2023-04-02 LAB — LIPID PANEL
Chol/HDL Ratio: 2.1 ratio (ref 0.0–5.0)
Cholesterol, Total: 187 mg/dL (ref 100–199)
HDL: 87 mg/dL (ref >39)
LDL Chol Calc (NIH): 78 mg/dL (ref 0–99)
Triglycerides: 131 mg/dL (ref 0–149)
VLDL Cholesterol Cal: 22 mg/dL (ref 5–40)

## 2023-04-02 LAB — RPR: RPR Ser Ql: NONREACTIVE

## 2023-04-02 LAB — CBC WITH DIFFERENTIAL/PLATELET
Basophils Absolute: 0.1 10*3/uL (ref 0.0–0.2)
Basos: 1 %
EOS (ABSOLUTE): 0.1 10*3/uL (ref 0.0–0.4)
Eos: 1 %
Hematocrit: 48.2 % (ref 37.5–51.0)
Hemoglobin: 15.6 g/dL (ref 13.0–17.7)
Immature Grans (Abs): 0 10*3/uL (ref 0.0–0.1)
Immature Granulocytes: 0 %
Lymphocytes Absolute: 1.3 10*3/uL (ref 0.7–3.1)
Lymphs: 16 %
MCH: 31.3 pg (ref 26.6–33.0)
MCHC: 32.4 g/dL (ref 31.5–35.7)
MCV: 97 fL (ref 79–97)
Monocytes Absolute: 0.7 10*3/uL (ref 0.1–0.9)
Monocytes: 9 %
Neutrophils Absolute: 6.2 10*3/uL (ref 1.4–7.0)
Neutrophils: 73 %
Platelets: 412 10*3/uL (ref 150–450)
RBC: 4.99 x10E6/uL (ref 4.14–5.80)
RDW: 13.3 % (ref 11.6–15.4)
WBC: 8.4 10*3/uL (ref 3.4–10.8)

## 2023-04-02 LAB — VITAMIN D 25 HYDROXY (VIT D DEFICIENCY, FRACTURES): Vit D, 25-Hydroxy: 8 ng/mL — ABNORMAL LOW (ref 30.0–100.0)

## 2023-04-02 LAB — CMP14+EGFR
ALT: 14 IU/L (ref 0–44)
AST: 15 IU/L (ref 0–40)
Albumin/Globulin Ratio: 1.7 (ref 1.2–2.2)
Albumin: 4.3 g/dL (ref 3.8–4.9)
Alkaline Phosphatase: 109 IU/L (ref 44–121)
BUN/Creatinine Ratio: 13 (ref 9–20)
BUN: 13 mg/dL (ref 6–24)
Bilirubin Total: 0.4 mg/dL (ref 0.0–1.2)
CO2: 27 mmol/L (ref 20–29)
Calcium: 9.5 mg/dL (ref 8.7–10.2)
Chloride: 101 mmol/L (ref 96–106)
Creatinine, Ser: 0.99 mg/dL (ref 0.76–1.27)
Globulin, Total: 2.6 g/dL (ref 1.5–4.5)
Glucose: 90 mg/dL (ref 70–99)
Potassium: 4.9 mmol/L (ref 3.5–5.2)
Sodium: 142 mmol/L (ref 134–144)
Total Protein: 6.9 g/dL (ref 6.0–8.5)
eGFR: 89 mL/min/{1.73_m2} (ref >59)

## 2023-04-02 LAB — HEPB+HEPC+HIV PANEL
HIV Screen 4th Generation wRfx: NONREACTIVE
Hep B C IgM: NEGATIVE
Hep B Core Total Ab: POSITIVE — AB
Hep B E Ab: NONREACTIVE
Hep B E Ag: NEGATIVE
Hep B Surface Ab, Qual: REACTIVE
Hep C Virus Ab: NONREACTIVE
Hepatitis B Surface Ag: NEGATIVE

## 2023-04-02 LAB — TSH: TSH: 2.82 u[IU]/mL (ref 0.450–4.500)

## 2023-04-02 MED ORDER — VITAMIN D (ERGOCALCIFEROL) 1.25 MG (50000 UNIT) PO CAPS: 50000.0000 [IU] | ORAL_CAPSULE | ORAL | 0 refills | Status: DC

## 2023-04-02 NOTE — Addendum Note (Signed)
Addended by: Neale Burly on: 04/02/2023 10:27 AM   Modules accepted: Orders

## 2023-04-06 ENCOUNTER — Encounter (INDEPENDENT_AMBULATORY_CARE_PROVIDER_SITE_OTHER): Payer: Self-pay | Admitting: *Deleted

## 2023-04-07 MED ORDER — DOXYCYCLINE HYCLATE 100 MG PO TABS: 100.0000 mg | ORAL_TABLET | Freq: Two times a day (BID) | ORAL | 0 refills | Status: AC

## 2023-04-07 MED ORDER — PREDNISONE 20 MG PO TABS
40.0000 mg | ORAL_TABLET | Freq: Every day | ORAL | 0 refills | Status: AC
Start: 2023-04-07 — End: 2023-04-12

## 2023-04-07 NOTE — Progress Notes (Signed)
Doxycycline and Prednisone sent in for patient for possible COPD exacerbation with pneumonia. Please have patient schedule follow up after he completes antibiotic. Thanks!

## 2023-04-12 ENCOUNTER — Other Ambulatory Visit (HOSPITAL_COMMUNITY): Payer: Self-pay

## 2023-04-12 ENCOUNTER — Telehealth: Payer: Self-pay | Admitting: Pharmacy Technician

## 2023-04-12 NOTE — Telephone Encounter (Signed)
RCID Patient Product/process development scientist completed.    The patient is insured through [employer] ( [place]) and has a $0 copay for Viread and $4 copay for [facility].

## 2023-04-14 ENCOUNTER — Ambulatory Visit: Payer: Medicaid Other | Admitting: Internal Medicine

## 2023-04-15 ENCOUNTER — Ambulatory Visit: Payer: Medicaid Other | Admitting: Family Medicine

## 2023-04-16 ENCOUNTER — Telehealth: Payer: Self-pay | Admitting: Family Medicine

## 2023-04-16 DIAGNOSIS — R053 Chronic cough: Secondary | ICD-10-CM

## 2023-04-16 MED ORDER — BREZTRI AEROSPHERE 160-9-4.8 MCG/ACT IN AERO: 2.0000 | INHALATION_SPRAY | Freq: Two times a day (BID) | RESPIRATORY_TRACT | 2 refills | Status: DC

## 2023-04-19 NOTE — Telephone Encounter (Signed)
Contacted patient and gave instructions. Patient verbalized understanding. Left sample of Breztri up front or patient pick up. Also patient is not currently seeing pulmonology. Referral placed.

## 2023-04-21 ENCOUNTER — Encounter: Payer: Self-pay | Admitting: Family Medicine

## 2023-04-21 ENCOUNTER — Ambulatory Visit: Payer: Medicaid Other | Admitting: Family Medicine

## 2023-04-23 ENCOUNTER — Telehealth: Payer: Self-pay | Admitting: Internal Medicine

## 2023-04-23 NOTE — Telephone Encounter (Signed)
LMOVM  trying to offer an earlier appointment with Dr. Jenene Slicker .

## 2023-04-26 ENCOUNTER — Telehealth: Payer: Self-pay

## 2023-04-26 ENCOUNTER — Other Ambulatory Visit (HOSPITAL_COMMUNITY): Payer: Self-pay

## 2023-04-26 DIAGNOSIS — J439 Emphysema, unspecified: Secondary | ICD-10-CM

## 2023-04-26 DIAGNOSIS — R053 Chronic cough: Secondary | ICD-10-CM

## 2023-04-26 DIAGNOSIS — Z720 Tobacco use: Secondary | ICD-10-CM

## 2023-04-26 NOTE — Telephone Encounter (Signed)
Patient Advocate Encounter   Received notification from [facility] [place] that prior authorization is required for [employer] 160-9-4.8MCG/ACT aerosol   Submitted: n/a Key [id number]  PA not submitted at this time. Awaiting response from office.    Question on prior auth form:    Medicaid formulary preferred:

## 2023-04-26 NOTE — Telephone Encounter (Signed)
Alan [name] is not covered by [employer].  Covered alternatives are:  Advair Diskus Advair HFA Inhaler [facility] Inhaler Symbicort Inhaler

## 2023-04-27 MED ORDER — MOMETASONE FURO-FORMOTEROL FUM 100-5 MCG/ACT IN AERO
2.0000 | INHALATION_SPRAY | Freq: Two times a day (BID) | RESPIRATORY_TRACT | 2 refills | Status: DC
Start: 2023-04-27 — End: 2023-07-29

## 2023-05-04 ENCOUNTER — Ambulatory Visit: Payer: Medicaid Other | Admitting: Family Medicine

## 2023-05-05 NOTE — Telephone Encounter (Signed)
Patient picked up his dulera

## 2023-05-05 NOTE — Addendum Note (Signed)
Addended by: Neale Burly on: 05/05/2023 09:00 AM   Modules accepted: Orders

## 2023-05-10 NOTE — Progress Notes (Signed)
Alan [name], male    DOB: [birth year]    MRN: [id number]   Brief patient profile:  57  yobm  active smoker in [employer] x 4 y referred to pulmonary clinic in [place]  05/12/2023 by Ellamae Sia NP at [facility] for copd eval with onset of doe around 2019 but already slowed down by severe R knee problems    Had aecopd vs pna in may 2024 rx abx / prednisone and no worse since quit     History of Present Illness  05/12/2023  Pulmonary/ 1st office eval/ Sherene Sires / [facility] dulera 100 Chief Complaint  Patient presents with   Consult  Dyspnea:  struggle to get to car - stops half a flight of steps  Walks to park maybe 50 ft and has stop  Cough:congested/rattlng/ min productive was green now mostly white  Sleep: in a recliner 45 degrees or couch with 2 pillows  SABA use: just dulera  02: none  Lung cancer screen: rec  No obvious day to day or daytime pattern/variability or assoc excess/ purulent sputum or mucus plugs or hemoptysis or cp or chest tightness, subjective wheeze or overt sinus or hb symptoms.    Also denies any obvious fluctuation of symptoms with weather or environmental changes or other aggravating or alleviating factors except as outlined above   No unusual exposure hx or h/o childhood pna/ asthma or knowledge of premature birth.  Current Allergies, Complete Past Medical History, Past Surgical History, Family History, and Social History were reviewed in [employer] record.  ROS  The following are not active complaints unless bolded Hoarseness, sore throat, dysphagia, dental problems, itching, sneezing,  nasal congestion or discharge of excess mucus or purulent secretions, ear ache,   fever, chills, sweats, unintended wt loss or wt gain, classically pleuritic or exertional cp,  orthopnea pnd or arm/hand swelling  or leg swelling, presyncope, palpitations, abdominal pain, anorexia, nausea, vomiting, diarrhea  or change in bowel habits or change in  bladder habits, change in stools or change in urine, dysuria, hematuria,  rash, arthralgias, visual complaints, headache, numbness, weakness or ataxia or problems with walking or coordination,  change in mood or  memory.            Outpatient Medications Prior to Visit  Medication Sig Dispense Refill   acetaminophen (TYLENOL) 500 MG tablet Take 250-500 mg by mouth every 6 (six) hours as needed for moderate pain or headache.     aspirin-acetaminophen-caffeine (EXCEDRIN MIGRAINE) 250-250-65 MG tablet Take 1 tablet by mouth every 6 (six) hours as needed for headache.     famotidine-calcium carbonate-magnesium hydroxide (PEPCID COMPLETE) 10-800-165 MG chewable tablet Chew 1 tablet by mouth daily as needed (acid reflux).     ibuprofen (ADVIL) 600 MG tablet Take 1 tablet (600 mg total) by mouth every 8 (eight) hours as needed. 30 tablet 0   Menthol, Topical Analgesic, (BIOFREEZE EX) Apply 1 application topically daily as needed (pain).     mometasone-formoterol (DULERA) 100-5 MCG/ACT AERO Inhale 2 puffs into the lungs 2 (two) times daily. 13 g 2   Tetrahydrozoline HCl (VISINE OP) Place 1 drop into both eyes daily as needed (dry eyes).     Vitamin D, Ergocalciferol, (DRISDOL) 1.25 MG (50000 UNIT) CAPS capsule Take 1 capsule (50,000 Units total) by mouth every 7 (seven) days for 12 doses. 12 capsule 0   No facility-administered medications prior to visit.    No past medical history on file.  Objective:     BP 129/78   Pulse 88   Ht 5\' 9"  (1.753 m)   Wt 116 lb (52.6 kg)   SpO2 93% Comment: RA  BMI 17.13 kg/m   SpO2: 93 % (RA) congested sounding cough   HEENT : Oropharynx  clear/ edentulous      NECK :  without  apparent JVD/ palpable Nodes/TM    LUNGS: no acc muscle use,  Mild barrel  contour chest wall with bilateral  Distant bs s audible wheeze and  without cough on insp or exp maneuvers  and mild  Hyperresonant  to  percussion bilaterally     CV:  RRR  no s3 or murmur or  increase in P2, and no edema   ABD:  soft and nontender with pos end  insp Hoover's  in the supine position.  No bruits or organomegaly appreciated   MS:  Nl gait/ ext warm without deformities Or obvious joint restrictions  calf tenderness, cyanosis or clubbing     SKIN: warm and dry without lesions    NEURO:  alert, approp, nl sensorium with  no motor or cerebellar deficits apparent.        I personally reviewed images and agree with radiology impression as follows:  CXR:   pa and lateral 03/02/23  Emphysema with bilateral reticulonodular/nodular opacities which may reflect developing infection. Followup PA and lateral chest X-ray is recommended in 3-4 weeks following therapy to assure resolution.  Cxr 05/12/2023 ordered, did not go for cxr as rec Assessment   COPD gold ? / active smoker Active smoker - 57/01/2023   Walked on RA  x  3  lap(s) =  approx 450  ft  @ mod pace, stopped due to end of study with lowest 02 sats 94% s sob/chest discomfort  - 05/12/2023  After extensive coaching inhaler device,  effectiveness =    75% with smi try adding spiriva 2.5 to dulera 100    Group D (now reclassified as E) in terms of symptom/risk and laba/lama/ICS  therefore appropriate rx at this point >>>  dulera/spiriva approp for now pending return for pfts     Cigarette smoker 4-5 min discussion re active cigarette smoking in addition to office E&M  Ask about tobacco use:   ongoing Advise quitting   I took an extended  opportunity with this patient to outline the consequences of continued cigarette use  in airway disorders based on all the data we have from the multiple national lung health studies (perfomed over decades at millions of dollars in cost)  indicating that smoking cessation, not choice of inhalers or pulmonary physicians, is the most important aspect of his  care.   Assess willingness:  Not committed at this point Assist in quit attempt:  Per PCP when ready Arrange follow up:   Follow  up per Primary Care planned   Low-dose CT lung cancer screening is recommended for patients who are 57-63 years of age with a 20+ pack-year history of smoking and who are currently smoking or quit <=15 years ago. No coughing up blood  No unintentional weight loss of > 15 pounds in the last 6 months - pt is eligible for scanning yearly until 38 y p quits > referred today      Pulmonary infiltrates on CXR See cxr  04/01/23  more c/w inflammatory than neoplastic dz but difficult to exclude, certainly on a single cxr   Requested f/u cxr today and will schedule LDSCT  when available vs prioritiy CT with contrast if lesions are still present of plain cxr p rx with abx.   Discussed in detail all the  indications, usual  risks and alternatives  relative to the benefits with patient who agrees to proceed with w/u as outlined.        Each maintenance medication was reviewed in detail including emphasizing most importantly the difference between maintenance and prns and under what circumstances the prns are to be triggered using an action plan format where appropriate.  Total time for H and P, chart review, counseling, reviewing hfa/smi device(s) and generating customized AVS unique to this office visit / same day charting = 45 min new pt eval       Sandrea Hughs, MD 05/12/2023

## 2023-05-12 ENCOUNTER — Encounter: Payer: Self-pay | Admitting: Internal Medicine

## 2023-05-12 ENCOUNTER — Ambulatory Visit: Payer: Medicaid Other | Admitting: Internal Medicine

## 2023-05-12 VITALS — BP 129/78 | HR 88 | Ht 69.0 in | Wt 116.0 lb

## 2023-05-12 DIAGNOSIS — F17210 Nicotine dependence, cigarettes, uncomplicated: Secondary | ICD-10-CM

## 2023-05-12 DIAGNOSIS — R918 Other nonspecific abnormal finding of lung field: Secondary | ICD-10-CM | POA: Diagnosis not present

## 2023-05-12 DIAGNOSIS — J449 Chronic obstructive pulmonary disease, unspecified: Secondary | ICD-10-CM

## 2023-05-12 MED ORDER — SPIRIVA RESPIMAT 2.5 MCG/ACT IN AERS
INHALATION_SPRAY | RESPIRATORY_TRACT | 11 refills | Status: DC
Start: 2023-05-12 — End: 2024-05-29

## 2023-05-12 NOTE — Patient Instructions (Addendum)
Plan A = Automatic = Always=    Dulera 100 x 2 puffs first thing each am/ spiriva 2.5 x 2 puffs then 12 hours later just 2 more puffs of the dulera   Work on inhaler technique:  relax and gently blow all the way out then take a nice smooth full deep breath back in, triggering the inhaler at same time you start breathing in.  Hold breath in for at least  5 seconds if you can. Blow out [employer] and spiriva  thru nose. Rinse and gargle with water when done.  If mouth or throat bother you at all,  try brushing teeth/gums/tongue with arm and hammer toothpaste/ make a slurry and gargle and spit out.     The key is to stop smoking completely before smoking completely stops you!  Please remember to go to the  x-ray department  @  [facility] for your tests - we will call you with the results when they are available     Please schedule a follow up visit in 3 months but call sooner if needed with PFTs

## 2023-05-12 NOTE — Assessment & Plan Note (Signed)
4-5 min discussion re active cigarette smoking in addition to office E&M  Ask about tobacco use:   ongoing Advise quitting   I took an extended  opportunity with this patient to outline the consequences of continued cigarette use  in airway disorders based on all the data we have from the multiple national lung health studies (perfomed over decades at millions of dollars in cost)  indicating that smoking cessation, not choice of inhalers or pulmonary physicians, is the most important aspect of his  care.   Assess willingness:  Not committed at this point Assist in quit attempt:  Per PCP when ready Arrange follow up:   Follow up per Primary Care planned   Low-dose CT lung cancer screening is recommended for patients who are 42-46 years of age with a 20+ pack-year history of smoking and who are currently smoking or quit <=15 years ago. No coughing up blood  No unintentional weight loss of > 15 pounds in the last 6 months - pt is eligible for scanning yearly until 27 y p quits > referred today

## 2023-05-12 NOTE — Assessment & Plan Note (Addendum)
Active smoker - 05/12/2023  After extensive coaching inhaler device,  effectiveness =    75% with smi try adding spiriva 2.5 to dulera 100    Group D (now reclassified as E) in terms of symptom/risk and laba/lama/ICS  therefore appropriate rx at this point >>>  dulera/spiriva approp for now pending return for pfts

## 2023-05-13 DIAGNOSIS — R918 Other nonspecific abnormal finding of lung field: Secondary | ICD-10-CM | POA: Insufficient documentation

## 2023-05-13 NOTE — Assessment & Plan Note (Addendum)
See cxr  04/01/23  more c/w inflammatory than neoplastic dz but difficult to exclude, certainly on a single cxr   Requested f/u cxr today and will schedule LDSCT when available vs prioritiy CT with contrast if lesions are still present of plain cxr p rx with abx.   Discussed in detail all the  indications, usual  risks and alternatives  relative to the benefits with patient who agrees to proceed with w/u as outlined.            Each maintenance medication was reviewed in detail including emphasizing most importantly the difference between maintenance and prns and under what circumstances the prns are to be triggered using an action plan format where appropriate.  Total time for H and P, chart review, counseling, reviewing hfa/smi device(s) and generating customized AVS unique to this office visit / same day charting = 45 min new pt eval

## 2023-05-17 ENCOUNTER — Encounter: Payer: Self-pay | Admitting: Internal Medicine

## 2023-05-17 ENCOUNTER — Ambulatory Visit: Payer: Medicaid Other | Attending: Internal Medicine | Admitting: Internal Medicine

## 2023-05-17 VITALS — BP 118/82 | HR 85 | Ht 69.0 in | Wt 119.2 lb

## 2023-05-17 DIAGNOSIS — M79661 Pain in right lower leg: Secondary | ICD-10-CM | POA: Diagnosis not present

## 2023-05-17 DIAGNOSIS — M79662 Pain in left lower leg: Secondary | ICD-10-CM

## 2023-05-17 DIAGNOSIS — I739 Peripheral vascular disease, unspecified: Secondary | ICD-10-CM | POA: Diagnosis not present

## 2023-05-17 DIAGNOSIS — R0609 Other forms of dyspnea: Secondary | ICD-10-CM | POA: Insufficient documentation

## 2023-05-17 NOTE — Progress Notes (Signed)
Cardiology Office Note  Date: 05/17/2023   ID: Alan [name], DOB 1966-04-17, MRN [id number]  PCP:  Arrie Senate, FNP  Cardiologist:  Marjo Bicker, MD Electrophysiologist:  None   Reason for Office Visit: Evaluation of tachycardia and bibasilar crackles   History of Present Illness: Alan [name] is a 57 y.o. male known to have COPD was referred to cardiology clinic for evaluation of tachycardia and bibasilar crackles.  Patient was having DOE (sometimes has orthopnea symptoms sleeps in a recliner) for some time. No leg swelling.  No angina.  No dizziness, palpitations or syncope.  Smokes 1 and half pack of cigarettes per day.   Past Medical History:  Diagnosis Date   Right knee pain    Right shoulder pain     Past Surgical History:  Procedure Laterality Date   TONSILLECTOMY  57 yo    Current Outpatient Medications  Medication Sig Dispense Refill   acetaminophen (TYLENOL) 500 MG tablet Take 250-500 mg by mouth every 6 (six) hours as needed for moderate pain or headache.     aspirin-acetaminophen-caffeine (EXCEDRIN MIGRAINE) 250-250-65 MG tablet Take 1 tablet by mouth every 6 (six) hours as needed for headache.     famotidine-calcium carbonate-magnesium hydroxide (PEPCID COMPLETE) 10-800-165 MG chewable tablet Chew 1 tablet by mouth daily as needed (acid reflux).     ibuprofen (ADVIL) 600 MG tablet Take 1 tablet (600 mg total) by mouth every 8 (eight) hours as needed. 30 tablet 0   Menthol, Topical Analgesic, (BIOFREEZE EX) Apply 1 application topically daily as needed (pain).     mometasone-formoterol (DULERA) 100-5 MCG/ACT AERO Inhale 2 puffs into the lungs 2 (two) times daily. 13 g 2   Tetrahydrozoline HCl (VISINE OP) Place 1 drop into both eyes daily as needed (dry eyes).     Tiotropium Bromide Monohydrate (SPIRIVA RESPIMAT) 2.5 MCG/ACT AERS 2 puffs each am 4 g 11   No current facility-administered medications for this visit.   Allergies:   Patient has no known allergies.   Social History: The patient  reports that he has been smoking cigarettes. He started smoking about 41 years ago. He has a 61.50 pack-year smoking history. He has never used smokeless tobacco. He reports current alcohol use. He reports current drug use. Drugs: Marijuana and Cocaine.   Family History: The patient's family history includes Asthma in his brother; Diabetes in his father and mother; Heart disease in his mother.   ROS:  Please see the history of present illness. Otherwise, complete review of systems is positive for none  All other systems are reviewed and negative.   Physical Exam: VS:  BP 118/82   Pulse 85   Ht 5\' 9"  (1.753 m)   Wt 119 lb 3.2 oz (54.1 kg)   SpO2 96%   BMI 17.60 kg/m , BMI Body mass index is 17.6 kg/m.  Wt Readings from Last 3 Encounters:  05/17/23 119 lb 3.2 oz (54.1 kg)  05/12/23 116 lb (52.6 kg)  04/01/23 115 lb 3.2 oz (52.3 kg)    General: Patient appears comfortable at rest. HEENT: Conjunctiva and lids normal, oropharynx clear with moist mucosa. Neck: Supple, no elevated JVP or carotid bruits, no thyromegaly. Lungs: Clear to auscultation, nonlabored breathing at rest. Cardiac: Regular rate and rhythm, no S3 or significant systolic murmur, no pericardial rub. Abdomen: Soft, nontender, no hepatomegaly, bowel sounds present, no guarding or rebound. Extremities: No pitting edema, distal pulses 2+. Skin: Warm and dry. Musculoskeletal: No kyphosis.  Neuropsychiatric: Alert and oriented x3, affect grossly appropriate.  Recent Labwork: 04/01/2023: ALT 14; AST 15; BUN 13; Creatinine, Ser 0.99; Hemoglobin 15.6; Platelets 412; Potassium 4.9; Sodium 142; TSH 2.820     Component Value Date/Time   CHOL 187 04/01/2023 1554   TRIG 131 04/01/2023 1554   HDL 87 04/01/2023 1554   CHOLHDL 2.1 04/01/2023 1554   LDLCALC 78 04/01/2023 1554    Assessment and Plan:  # DOE likely from COPD -Chest x-ray from 5/24 showed emphysema.  Chronic current smoker, 1 and half pack per day. Physical exam consistent with bilateral basal rales.  He is yet to undergo PFTs. All the above findings are suggestive of lung pathology. Obtain 2D echocardiogram to rule out cardiomyopathy, diastolic dysfunction and any severe valvular heart disease.  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  Smoking cessation counseling provided. Smoking cessation instruction/counseling given:  counseled patient on the dangers of tobacco use, advised patient to stop smoking, and reviewed strategies to maximize success    I have spent a total of 45 minutes with patient reviewing chart, EKGs, labs and examining patient as well as establishing an assessment and plan that was discussed with the patient.  > 50% of time was spent in direct patient care.    Medication Adjustments/Labs and Tests Ordered: Current medicines are reviewed at length with the patient today.  Concerns regarding medicines are outlined above.   Tests Ordered: Orders Placed This Encounter  Procedures   US ARTERIAL ABI (SCREENING LOWER EXTREMITY)   ECHOCARDIOGRAM COMPLETE    Medication Changes: No orders of the defined types were placed in this encounter.   Disposition:  Follow up  pending results  Signed Mikaele Stecher Verne Spurr, MD, 05/17/2023 2:03 PM    [facility] at [facility] [address] [place], [place], [place] [address]

## 2023-05-17 NOTE — Patient Instructions (Addendum)
Medication Instructions:  Your physician recommends that you continue on your current medications as directed. Please refer to the Current Medication list given to you today.   Labwork: None  Testing/Procedures: Your physician has requested that you have an echocardiogram. Echocardiography is a painless test that uses sound waves to create images of your heart. It provides your doctor with information about the size and shape of your heart and how well your heart's chambers and valves are working. This procedure takes approximately one hour. There are no restrictions for this procedure. Please do NOT wear cologne, perfume, aftershave, or lotions (deodorant is allowed). Please arrive 15 minutes prior to your appointment time.  Your physician has requested that you have an ankle brachial index (ABI). During this test an ultrasound and blood pressure cuff are used to evaluate the arteries that supply the arms and legs with blood. Allow thirty minutes for this exam. There are no restrictions or special instructions.   Follow-Up: Your physician recommends that you schedule a follow-up appointment in: Pending Follow up  Any Other Special Instructions Will Be Listed Below (If Applicable).  If you need a refill on your cardiac medications before your next appointment, please call your pharmacy.

## 2023-05-21 ENCOUNTER — Ambulatory Visit (HOSPITAL_COMMUNITY)
Admission: RE | Admit: 2023-05-21 | Discharge: 2023-05-21 | Disposition: A | Discharge status: Home / Self Care | Payer: Medicaid Other | Source: Ambulatory Visit | Attending: Internal Medicine | Admitting: Internal Medicine

## 2023-05-21 DIAGNOSIS — R0609 Other forms of dyspnea: Secondary | ICD-10-CM | POA: Diagnosis present

## 2023-05-21 LAB — ECHOCARDIOGRAM COMPLETE
AR max vel: 2.82 cm2
AV Area VTI: 2.98 cm2
AV Area mean vel: 3.27 cm2
AV Mean grad: 2.2 mmHg
AV Peak grad: 5.5 mmHg
Ao pk vel: 1.17 m/s
Area-P 1/2: 3.85 cm2
S' Lateral: 2.5 cm

## 2023-05-21 NOTE — Progress Notes (Signed)
*  PRELIMINARY RESULTS* Echocardiogram 2D Echocardiogram has been performed.  Alan [name] 05/21/2023, 2:11 PM

## 2023-05-24 ENCOUNTER — Ambulatory Visit: Payer: Medicaid Other

## 2023-05-27 ENCOUNTER — Other Ambulatory Visit: Payer: Self-pay | Admitting: Emergency Medicine

## 2023-05-27 DIAGNOSIS — F17210 Nicotine dependence, cigarettes, uncomplicated: Secondary | ICD-10-CM

## 2023-05-27 DIAGNOSIS — Z87891 Personal history of nicotine dependence: Secondary | ICD-10-CM

## 2023-05-27 DIAGNOSIS — Z122 Encounter for screening for malignant neoplasm of respiratory organs: Secondary | ICD-10-CM

## 2023-05-31 ENCOUNTER — Ambulatory Visit (HOSPITAL_COMMUNITY): Payer: Medicaid Other

## 2023-05-31 ENCOUNTER — Telehealth: Payer: Self-pay | Admitting: Internal Medicine

## 2023-05-31 NOTE — Telephone Encounter (Signed)
Checking percert on the following patient for testing scheduled at [facility].    US ARTERIAL SEG SINGLE

## 2023-06-02 ENCOUNTER — Ambulatory Visit: Payer: Medicaid Other | Admitting: Family Medicine

## 2023-06-02 ENCOUNTER — Encounter: Payer: Self-pay | Admitting: Family Medicine

## 2023-06-02 VITALS — BP 103/71 | HR 100 | Temp 98.5°F | Ht 69.0 in | Wt 112.0 lb

## 2023-06-02 DIAGNOSIS — Z1331 Encounter for screening for depression: Secondary | ICD-10-CM

## 2023-06-02 DIAGNOSIS — G8929 Other chronic pain: Secondary | ICD-10-CM

## 2023-06-02 DIAGNOSIS — R931 Abnormal findings on diagnostic imaging of heart and coronary circulation: Secondary | ICD-10-CM

## 2023-06-02 DIAGNOSIS — J449 Chronic obstructive pulmonary disease, unspecified: Secondary | ICD-10-CM

## 2023-06-02 DIAGNOSIS — Z23 Encounter for immunization: Secondary | ICD-10-CM

## 2023-06-02 DIAGNOSIS — M25561 Pain in right knee: Secondary | ICD-10-CM

## 2023-06-02 DIAGNOSIS — R000 Tachycardia, unspecified: Secondary | ICD-10-CM | POA: Diagnosis not present

## 2023-06-02 DIAGNOSIS — M25562 Pain in left knee: Secondary | ICD-10-CM

## 2023-06-02 MED ORDER — FUROSEMIDE 20 MG PO TABS
20.0000 mg | ORAL_TABLET | Freq: Every day | ORAL | 2 refills | Status: DC
Start: 2023-06-02 — End: 2023-07-16

## 2023-06-02 NOTE — Patient Instructions (Addendum)
Need to complete Pulmonary Function Tests with Pulmonology - Dr. Sherene Sires  Need to complete CT chest to rule out PE with Cardiology  Follow up with Dr. Romeo Apple for knee.   Start taking Lasix 20 mg daily Come back in one week for lab work.    Pulmonology  Charlaine Dalton. Sherene Sires, MD Specialties and/or Subspecialties Pulmonary/Critical Care Medicine [phone]   [facility] at [facility] [address] [place], [place] [address] [phone] Locate on [facility] at [facility] [address] [place], [place] [address] [phone] Locate on Map

## 2023-06-02 NOTE — Progress Notes (Signed)
Acute Office Visit  Subjective:  Patient ID: Alan [name], male    DOB: Mar 09, 1966, 57 y.o.   MRN: [id number]  Chief Complaint  Patient presents with   Medical Management of Chronic Issues    Need 2nd shingrix injection    HPI Patient is in today for follow up of chronic conditions.  States that he broke his phone, which is why he missed some of his follow up appointments. His new contact information for right now is his partner's number [phone].   COPD  Followed up with pulmonology, but has yet to complete PFTs. Started Spiriva. Is taking both Dulera and Spiriva currently. States that breathing has improved since starting.  Denies peripheral edema. Reports that he is limited by his knees rather than his breathing when walking.   Tachycardia  States that he believes it is from the pain in his legs. Denies palpitations and chest pain.   Knee Pain  Continues to have bilateral knee pain. States that his right knee "gave out" about a month ago and cause him to fall and pull something in his left knee. States that left knee "burns" all the time.  Reports pain as 9/10. States that he is ready to go back to Dr. Romeo Apple to pursue surgical repair. Soaking it in hot water helps the pain.   Depression  States that he is overwhelmed with all of his specialty appointments, but that he is managing on his own and does not wish to start medication at this time or counseling   Of note, reports that his mom passed at 41 with a history of CABG x 3 and multiple CVAs.   ROS As per HPI  Objective:  BP 103/71   Pulse 100   Temp 98.5 F (36.9 C)   Ht 5\' 9"  (1.753 m)   Wt 112 lb (50.8 kg)   SpO2 93%   BMI 16.54 kg/m   Physical Exam Constitutional:      General: He is awake. He is not in acute distress.    Appearance: Normal appearance. He is well-developed and well-groomed. He is not ill-appearing, toxic-appearing or diaphoretic.  Cardiovascular:     Rate and Rhythm: Normal rate  and regular rhythm.     Pulses: Normal pulses.          Radial pulses are 2+ on the right side and 2+ on the left side.       Posterior tibial pulses are 2+ on the right side and 2+ on the left side.     Heart sounds: Normal heart sounds. No murmur heard.    No gallop.  Pulmonary:     Effort: Pulmonary effort is normal. No respiratory distress.     Breath sounds: Decreased air movement present. No stridor. Examination of the right-upper field reveals decreased breath sounds. Examination of the left-upper field reveals decreased breath sounds. Examination of the right-middle field reveals decreased breath sounds. Examination of the left-middle field reveals decreased breath sounds. Examination of the right-lower field reveals decreased breath sounds. Examination of the left-lower field reveals decreased breath sounds. Decreased breath sounds present. No wheezing, rhonchi or rales.  Musculoskeletal:     Cervical back: Full passive range of motion without pain and neck supple.     Right lower leg: No edema.     Left lower leg: No edema.  Skin:    General: Skin is warm.     Capillary Refill: Capillary refill takes less than 2 seconds.  Neurological:  General: No focal deficit present.     Mental Status: He is alert, oriented to person, place, and time and easily aroused. Mental status is at baseline.     GCS: GCS eye subscore is 4. GCS verbal subscore is 5. GCS motor subscore is 6.     Motor: No weakness.  Psychiatric:        Attention and Perception: Attention and perception normal.        Mood and Affect: Mood and affect normal.        Speech: Speech normal.        Behavior: Behavior normal. Behavior is cooperative.        Thought Content: Thought content normal. Thought content does not include homicidal or suicidal ideation. Thought content does not include homicidal or suicidal plan.        Cognition and Memory: Cognition and memory normal.        Judgment: Judgment normal.        06/02/2023    3:29 PM 04/01/2023    3:06 PM 04/01/2023    2:52 PM  Depression screen PHQ 2/9  Decreased Interest 3 1 0  Down, Depressed, Hopeless 1 2 0  PHQ - 2 Score 4 3 0  Altered sleeping 2 2   Tired, decreased energy 3 3   Change in appetite 1 1   Feeling bad or failure about yourself  1 1   Trouble concentrating 1 0   Moving slowly or fidgety/restless 0 1   Suicidal thoughts 0 0   PHQ-9 Score 12 11   Difficult doing work/chores  Somewhat difficult       06/02/2023    3:30 PM 04/01/2023    3:06 PM 04/13/2022    2:48 PM  GAD 7 : Generalized Anxiety Score  Nervous, Anxious, on Edge 1 1 0  Control/stop worrying 1 1 1   Worry too much - different things 1 1 1   Trouble relaxing 1 1 1   Restless 0 1 1  Easily annoyed or irritable 1 1 1   Afraid - awful might happen 1 1 1   Total GAD 7 Score 6 7 6   Anxiety Difficulty Somewhat difficult Somewhat difficult    Assessment & Plan:  1. COPD gold ? / active smoker Patient to follow up with Pulmonology, Dr. Sherene Sires. Per Cardiology note, 05/17/23 would like patient to complete PFTs. Provided patient with contact information to schedule appt with pulm.   2. Tachycardia Controlled on exam today. EKG collected 04/01/23. NSR. Patient to follow up with Cardiology.   3. Abnormal echocardiogram Reviewed echo report with patient. Per cardiology note of echo on 05/21/23 from [facility], patient to start Lasix daily and repeat BMP 5 days after. Medication as below. Lab order placed as below. In addition, wanted patient to complete CTA to rule out PE. Reviewed Wells Score, did not order CTA at this time, will defer to Cardiology.  - furosemide (LASIX) 20 MG tablet; Take 1 tablet (20 mg total) by mouth daily.  Dispense: 30 tablet; Refill: 2 - BMP8+EGFR; Future  4. Chronic pain of both knees Referral placed as below for patient to re-establish with Dr. Romeo Apple. Reviewed note from [facility] 09/01/21, recommended reconstruction of quadriceps tendon.   -  Ambulatory referral to Orthopedic Surgery  5. Encounter for screening for depression Pt screened positive for depression today. Pt offered nonpharmacologic and pharmacologic therapy. Pt declined initiating treatment at this time. Safety contract established today with patient in clinic. Denies intent to harm herself or  others. Pt to notify provider if they would like to initiate treatment.   6. Need for shingles vaccine - Zoster Recombinant (Shingrix )   The above assessment and management plan was discussed with the patient. The patient verbalized understanding of and has agreed to the management plan using shared-decision making. Patient is aware to call the clinic if they develop any new symptoms or if symptoms fail to improve or worsen. Patient is aware when to return to the clinic for a follow-up visit. Patient educated on when it is appropriate to go to the emergency department.   Return in about 3 months (around 09/02/2023) for Chronic Condition Follow up.  Neale Burly, DNP-FNP [facility] [address] [place], [place] [address] [phone]

## 2023-06-09 ENCOUNTER — Other Ambulatory Visit: Payer: Medicaid Other

## 2023-06-10 ENCOUNTER — Ambulatory Visit (HOSPITAL_COMMUNITY)
Admission: RE | Admit: 2023-06-10 | Discharge: 2023-06-10 | Disposition: A | Discharge status: Home / Self Care | Payer: Medicaid Other | Source: Ambulatory Visit | Attending: Internal Medicine | Admitting: Internal Medicine

## 2023-06-10 DIAGNOSIS — M79662 Pain in left lower leg: Secondary | ICD-10-CM | POA: Insufficient documentation

## 2023-06-10 DIAGNOSIS — M79661 Pain in right lower leg: Secondary | ICD-10-CM | POA: Insufficient documentation

## 2023-06-14 ENCOUNTER — Other Ambulatory Visit: Payer: Medicaid Other

## 2023-06-14 ENCOUNTER — Telehealth: Payer: Self-pay | Admitting: Internal Medicine

## 2023-06-14 DIAGNOSIS — I2699 Other pulmonary embolism without acute cor pulmonale: Secondary | ICD-10-CM

## 2023-06-14 DIAGNOSIS — R931 Abnormal findings on diagnostic imaging of heart and coronary circulation: Secondary | ICD-10-CM

## 2023-06-14 NOTE — Telephone Encounter (Signed)
Follow Up:     Patient is returning a call from today, concerning his test results. If he does not answer, please call [phone].

## 2023-06-14 NOTE — Telephone Encounter (Signed)
-----   Message from Vishnu P Mallipeddi sent at 05/26/2023  8:49 AM EDT ----- The right side of the heart is moderate to severely enlarged (RA moderately dilated, RV moderate to severely enlarged and RV systolic function is mildly reduced) with moderate elevation of PASP. Obtain CT angio chest to rule out PE. He is yet to undergo PFTs.  Start p.o. Lasix 20 mg once daily and obtain BMP in 5 days.

## 2023-06-14 NOTE — Telephone Encounter (Signed)
Called patient with results verbalized understanding. Patient reported already on Lasix 20 mg for about 2 weeks now and is having labs drawn today that were collected to check on how he is tolerating the medication. Sent copy to PCP as well.

## 2023-06-15 NOTE — Progress Notes (Signed)
BUN/Crt slightly decreased, but otherwise normal BMP. Can continue to take Lasix daily. Needs to follow up with Cardiology and Pulmonology.

## 2023-06-16 ENCOUNTER — Telehealth: Payer: Self-pay | Admitting: Internal Medicine

## 2023-06-16 NOTE — Telephone Encounter (Signed)
Checking percert on the following patient for testing scheduled at [facility].   CT ANGIO CHEST  07/15/2023

## 2023-06-22 ENCOUNTER — Other Ambulatory Visit: Payer: Self-pay | Admitting: Family Medicine

## 2023-06-28 ENCOUNTER — Ambulatory Visit: Payer: Medicaid Other | Admitting: Orthopedic Surgery

## 2023-06-28 ENCOUNTER — Encounter: Payer: Self-pay | Admitting: Orthopedic Surgery

## 2023-06-28 ENCOUNTER — Other Ambulatory Visit (INDEPENDENT_AMBULATORY_CARE_PROVIDER_SITE_OTHER): Payer: Medicaid Other

## 2023-06-28 VITALS — BP 125/85 | HR 123 | Ht 68.0 in | Wt 115.0 lb

## 2023-06-28 DIAGNOSIS — M25562 Pain in left knee: Secondary | ICD-10-CM | POA: Diagnosis not present

## 2023-06-28 DIAGNOSIS — G8929 Other chronic pain: Secondary | ICD-10-CM

## 2023-06-28 DIAGNOSIS — M1712 Unilateral primary osteoarthritis, left knee: Secondary | ICD-10-CM

## 2023-06-28 DIAGNOSIS — S76111D Strain of right quadriceps muscle, fascia and tendon, subsequent encounter: Secondary | ICD-10-CM | POA: Diagnosis not present

## 2023-06-28 MED ORDER — METHYLPREDNISOLONE ACETATE 40 MG/ML IJ SUSP
40.0000 mg | Freq: Once | INTRAMUSCULAR | Status: AC
Start: 2023-06-28 — End: 2023-06-28
  Administered 2023-06-28: 40 mg via INTRA_ARTICULAR

## 2023-06-28 NOTE — Progress Notes (Signed)
  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  There was a point where I was going to try to reconstruct it with an allograft but he lost lost to follow-up  He comes in with left knee pain and chronic symptoms from the right knee chronic quadriceps tendon rupture  In any event he did have some trauma where his knee hyperflexed on the left side he would like that evaluated  He is undergoing a cardiac workup for what sounds like congestive heart failure  He walks with a chronic limp from the right quadriceps tendon injury.  His left knee is tender over the medial joint line with no effusion he still has a very good range of motion.  He has no instability his quadriceps tendon is intact  His x-ray shows patellofemoral arthritis  Injection was given  Patient will come back in 6 months hopefully by that time his heart condition will be resolved  At that time we can discuss treatments for the right and left knee.  I do not think her quadriceps tendon allograft right now will make him more functional on the right he may be a candidate in the future for knee replacement but that can be definitely postponed as he is 57 and his tibiofemoral joint today on x-ray looks good   Encounter Diagnoses  Name Primary?   Chronic pain of left knee Yes   Primary osteoarthritis of left knee    Rupture of right quadriceps tendon, subsequent encounter     Procedure note left knee injection   verbal consent was obtained to inject left knee joint  Timeout was completed to confirm the site of injection  The medications used were depomedrol 40 mg and 1% lidocaine 3 cc Anesthesia was provided by ethyl chloride and the skin was prepped with alcohol.  After cleaning the skin  with alcohol a 20-gauge needle was used to inject the left knee joint. There were no complications. A sterile bandage was applied.

## 2023-07-07 ENCOUNTER — Ambulatory Visit: Payer: Medicaid Other | Admitting: Acute Care

## 2023-07-07 ENCOUNTER — Encounter: Payer: Self-pay | Admitting: Acute Care

## 2023-07-07 DIAGNOSIS — F17210 Nicotine dependence, cigarettes, uncomplicated: Secondary | ICD-10-CM | POA: Diagnosis not present

## 2023-07-07 NOTE — Progress Notes (Signed)
Virtual Visit via Telephone Note  I connected with Alan [name] on 07/07/23 at 11:00 AM EDT by telephone and verified that I am speaking with the correct person using two identifiers.  Location: Patient:  At home Provider: [address] [place], [place], [address]    I discussed the limitations, risks, security and privacy concerns of performing an evaluation and management service by telephone and the availability of in person appointments. I also discussed with the patient that there may be a patient responsible charge related to this service. The patient expressed understanding and agreed to proceed.   Shared Decision Making Visit Lung Cancer Screening Program [phone])   Eligibility: Age 57 y.o. Pack Years Smoking History Calculation 48.72 (# packs/per year x # years smoked) Recent History of coughing up blood  no Unexplained weight loss? no ( >Than 15 pounds within the last 6 months ) Prior History Lung / other cancer no (Diagnosis within the last 5 years already requiring surveillance chest CT Scans). Smoking Status Current Smoker Former Smokers: Years since quit:  NA  Quit Date:  NA  Visit Components: Discussion included one or more decision making aids. yes Discussion included risk/benefits of screening. yes Discussion included potential follow up diagnostic testing for abnormal scans. yes Discussion included meaning and risk of over diagnosis. yes Discussion included meaning and risk of False Positives. yes Discussion included meaning of total radiation exposure. yes  Counseling Included: Importance of adherence to annual lung cancer LDCT screening. yes Impact of comorbidities on ability to participate in the program. yes Ability and willingness to under diagnostic treatment. yes  Smoking Cessation Counseling: Current Smokers:  Discussed importance of smoking cessation. yes Information about tobacco cessation classes and interventions provided to  patient. yes Patient provided with "ticket" for LDCT Scan. yes Symptomatic Patient. no  Counseling NA Diagnosis Code: Tobacco Use Z72.0 Asymptomatic Patient yes  Counseling (Intermediate counseling: > three minutes counseling) Z3086 Former Smokers:  Discussed the importance of maintaining cigarette abstinence. yes Diagnosis Code: Personal History of Nicotine Dependence. V78.469 Information about tobacco cessation classes and interventions provided to patient. Yes Patient provided with "ticket" for LDCT Scan. yes Written Order for Lung Cancer Screening with LDCT placed in Epic. Yes (CT Chest Lung Cancer Screening Low Dose W/O CM) [id number] Z12.2-Screening of respiratory organs Z87.891-Personal history of nicotine dependence  I have spent 25 minutes of face to face/ virtual visit   time with Alan [name] discussing the risks and benefits of lung cancer screening. We viewed / discussed a power point together that explained in detail the above noted topics. We paused at intervals to allow for questions to be asked and answered to ensure understanding.We discussed that the single most powerful action that he can take to decrease his risk of developing lung cancer is to quit smoking. We discussed whether or not he is ready to commit to setting a quit date. We discussed options for tools to aid in quitting smoking including nicotine replacement therapy, non-nicotine medications, support groups, Quit Smart classes, and behavior modification. We discussed that often times setting smaller, more achievable goals, such as eliminating 1 cigarette a day for a week and then 2 cigarettes a day for a week can be helpful in slowly decreasing the number of cigarettes smoked. This allows for a sense of accomplishment as well as providing a clinical benefit. I provided  him  with smoking cessation  information  with contact information for community resources, classes, free nicotine replacement therapy, and access  to  mobile apps, text messaging, and on-line smoking cessation help. I have also provided  him  the office contact information in the event he needs to contact me, or the screening staff. We discussed the time and location of the scan, and that either Abigail Miyamoto RN, Karlton Lemon, RN  or I will call / send a letter with the results within 24-72 hours of receiving them. The patient verbalized understanding of all of  the above and had no further questions upon leaving the office. They have my contact information in the event they have any further questions.  I spent 3-4 minutes counseling on smoking cessation and the health risks of continued tobacco abuse.  I explained to the patient that there has been a high incidence of coronary artery disease noted on these exams. I explained that this is a non-gated exam therefore degree or severity cannot be determined. This patient is not on statin therapy. I have asked the patient to follow-up with their PCP regarding any incidental finding of coronary artery disease and management with diet or medication as their PCP  feels is clinically indicated. The patient verbalized understanding of the above and had no further questions upon completion of the visit.   Last 6 month weight loss >> 04/2022>> 118 lbs >> 03/2023 115 lbs >>05/2023 116 >> 05/2023 119   Bevelyn Ngo, NP 07/07/2023

## 2023-07-07 NOTE — Patient Instructions (Signed)

## 2023-07-15 ENCOUNTER — Ambulatory Visit (HOSPITAL_COMMUNITY)
Admission: RE | Admit: 2023-07-15 | Discharge: 2023-07-15 | Disposition: A | Discharge status: Home / Self Care | Payer: Medicaid Other | Source: Ambulatory Visit | Attending: Internal Medicine | Admitting: Internal Medicine

## 2023-07-15 ENCOUNTER — Ambulatory Visit (HOSPITAL_COMMUNITY)
Admission: RE | Admit: 2023-07-15 | Discharge: 2023-07-15 | Disposition: A | Discharge status: Home / Self Care | Payer: Medicaid Other | Source: Ambulatory Visit | Attending: Acute Care | Admitting: Acute Care

## 2023-07-15 DIAGNOSIS — I2699 Other pulmonary embolism without acute cor pulmonale: Secondary | ICD-10-CM

## 2023-07-15 DIAGNOSIS — F17210 Nicotine dependence, cigarettes, uncomplicated: Secondary | ICD-10-CM | POA: Insufficient documentation

## 2023-07-15 DIAGNOSIS — Z87891 Personal history of nicotine dependence: Secondary | ICD-10-CM | POA: Diagnosis present

## 2023-07-15 DIAGNOSIS — Z122 Encounter for screening for malignant neoplasm of respiratory organs: Secondary | ICD-10-CM | POA: Insufficient documentation

## 2023-07-15 MED ORDER — IOHEXOL 350 MG/ML SOLN
100.0000 mL | Freq: Once | INTRAVENOUS | Status: AC | PRN
  Administered 2023-07-15: 75 mL via INTRAVENOUS

## 2023-07-16 ENCOUNTER — Telehealth: Payer: Self-pay

## 2023-07-16 DIAGNOSIS — Z79899 Other long term (current) drug therapy: Secondary | ICD-10-CM

## 2023-07-16 DIAGNOSIS — I2699 Other pulmonary embolism without acute cor pulmonale: Secondary | ICD-10-CM

## 2023-07-16 MED ORDER — TORSEMIDE 20 MG PO TABS: 10.0000 mg | ORAL_TABLET | Freq: Every day | ORAL | 1 refills | Status: DC

## 2023-07-16 NOTE — Telephone Encounter (Signed)
Patient informed and verbalized understanding of plan. 

## 2023-07-16 NOTE — Telephone Encounter (Signed)
-----   Message from Vishnu P Mallipeddi sent at 07/16/2023 12:58 PM EDT ----- No evidence of PE. Switch lasix to torsemide 10 mg once daily with repeat BMP in 5 days. Re-schedule appointment from 12/2203 to Sep or Oct 2024 to re-evaluate symptoms.

## 2023-07-22 ENCOUNTER — Telehealth: Payer: Self-pay

## 2023-07-22 NOTE — Telephone Encounter (Signed)
Called patient to remind him to have labs completed before appointment. Patient reported that he has not stopped Lasix and hasn't picked up Torsemide. Stated that he will call [facility] and see if he has a script for them. Advise was sent in on 09/06. Will stop lasix and have lab work completed once he starts the torsemide for a week. Patient verbalized understanding and will come to appointment on tomorrow.

## 2023-07-23 ENCOUNTER — Encounter: Payer: Self-pay | Admitting: Internal Medicine

## 2023-07-23 ENCOUNTER — Ambulatory Visit: Payer: Medicaid Other | Attending: Internal Medicine | Admitting: Internal Medicine

## 2023-07-23 VITALS — BP 118/78 | HR 91 | Ht 69.0 in | Wt 113.4 lb

## 2023-07-23 DIAGNOSIS — I2699 Other pulmonary embolism without acute cor pulmonale: Secondary | ICD-10-CM

## 2023-07-23 DIAGNOSIS — I272 Pulmonary hypertension, unspecified: Secondary | ICD-10-CM | POA: Diagnosis not present

## 2023-07-23 DIAGNOSIS — R0609 Other forms of dyspnea: Secondary | ICD-10-CM | POA: Diagnosis not present

## 2023-07-23 DIAGNOSIS — I5032 Chronic diastolic (congestive) heart failure: Secondary | ICD-10-CM | POA: Insufficient documentation

## 2023-07-23 NOTE — Progress Notes (Signed)
Cardiology Office Note  Date: 07/23/2023   ID: Alan [name], DOB Jun 01, 1966, MRN [id number]  PCP:  Arrie Senate, FNP  Cardiologist:  Marjo Bicker, MD Electrophysiologist:  None    History of Present Illness: Alan [name] is a 57 y.o. male known to have COPD is here for follow-up visit.  DOE improved with inhalers but not with Lasix.  Lasix 20 mg dose was switched to torsemide 10 mg once daily by pulmonology yesterday. Echo from 7/24 showed normal LVEF, G1 DD, interventricular septum is flattened in systole consistent with RV pressure overload, RV systolic function is mildly reduced, RV size was moderate to severely enlarged and there was moderate elevation of PASP.  CT angio chest was performed that showed no evidence of pulmonary embolism.  ABI within normal limits.  No other symptoms of chest pain/angina, dizziness, presyncope, syncope or leg swelling.  Continues to smoke.   Past Medical History:  Diagnosis Date   Right knee pain    Right shoulder pain     Past Surgical History:  Procedure Laterality Date   TONSILLECTOMY  57 yo    Current Outpatient Medications  Medication Sig Dispense Refill   acetaminophen (TYLENOL) 500 MG tablet Take 250-500 mg by mouth every 6 (six) hours as needed for moderate pain or headache.     aspirin-acetaminophen-caffeine (EXCEDRIN MIGRAINE) 250-250-65 MG tablet Take 1 tablet by mouth every 6 (six) hours as needed for headache.     famotidine-calcium carbonate-magnesium hydroxide (PEPCID COMPLETE) 10-800-165 MG chewable tablet Chew 1 tablet by mouth daily as needed (acid reflux).     ibuprofen (ADVIL) 600 MG tablet Take 1 tablet (600 mg total) by mouth every 8 (eight) hours as needed. 30 tablet 0   Menthol, Topical Analgesic, (BIOFREEZE EX) Apply 1 application topically daily as needed (pain).     mometasone-formoterol (DULERA) 100-5 MCG/ACT AERO Inhale 2 puffs into the lungs 2 (two) times daily. 13 g 2    Tetrahydrozoline HCl (VISINE OP) Place 1 drop into both eyes daily as needed (dry eyes).     Tiotropium Bromide Monohydrate (SPIRIVA RESPIMAT) 2.5 MCG/ACT AERS 2 puffs each am 4 g 11   torsemide (DEMADEX) 20 MG tablet Take 0.5 tablets (10 mg total) by mouth daily. 90 tablet 1   No current facility-administered medications for this visit.   Allergies:  Patient has no known allergies.   Social History: The patient  reports that he has been smoking cigarettes. He started smoking about 42 years ago. He has a 63.2 pack-year smoking history. He has never used smokeless tobacco. He reports current alcohol use. He reports current drug use. Drugs: Marijuana and Cocaine.   Family History: The patient's family history includes Asthma in his brother; Diabetes in his father and mother; Heart disease in his mother.   ROS:  Please see the history of present illness. Otherwise, complete review of systems is positive for none  All other systems are reviewed and negative.   Physical Exam: VS:  BP 118/78   Pulse 91   Ht 5\' 9"  (1.753 m)   Wt 113 lb 6.4 oz (51.4 kg)   SpO2 95%   BMI 16.75 kg/m , BMI Body mass index is 16.75 kg/m.  Wt Readings from Last 3 Encounters:  07/23/23 113 lb 6.4 oz (51.4 kg)  06/28/23 115 lb (52.2 kg)  06/02/23 112 lb (50.8 kg)    General: Patient appears comfortable at rest. HEENT: Conjunctiva and lids normal, oropharynx clear with  moist mucosa. Neck: Supple, no elevated JVP or carotid bruits, no thyromegaly. Lungs: Clear to auscultation, nonlabored breathing at rest. Cardiac: Regular rate and rhythm, no S3 or significant systolic murmur, no pericardial rub. Abdomen: Soft, nontender, no hepatomegaly, bowel sounds present, no guarding or rebound. Extremities: No pitting edema, distal pulses 2+. Skin: Warm and dry. Musculoskeletal: No kyphosis. Neuropsychiatric: Alert and oriented x3, affect grossly appropriate.  Recent Labwork: 04/01/2023: ALT 14; AST 15; Hemoglobin 15.6;  Platelets 412; TSH 2.820 06/14/2023: BUN 6; Creatinine, Ser 0.80; Potassium 4.4; Sodium 142     Component Value Date/Time   CHOL 187 04/01/2023 1554   TRIG 131 04/01/2023 1554   HDL 87 04/01/2023 1554   CHOLHDL 2.1 04/01/2023 1554   LDLCALC 78 04/01/2023 1554    Assessment and Plan:  Chronic diastolic heart failure, moderate pulmonary hypertension: Echo from 7/24 showed normal LVEF, mildly reduced RV systolic dysfunction, moderate to severe enlargement of right ventricle, severe elevation of right atrial pressures due to bowing of interatrial septum to the left and moderate elevation of PASP. CT angio PE was negative for pulm embolism.  DOE improved with inhalers and not with Lasix. Lasix 20 mg dose was switched to torsemide 10 mg once daily by pulmonology yesterday. Obtain PFTs, VQ scan and switch torsemide 10 mg once daily to every other day from next week due to RV being preload dependent. If he develops worsening SOB, can start to take daily torsemide.  COPD: On inhalers, follows up with PCP/pulmonology.  Nicotine abuse: Current smoker, smokes 1 and half pack per day, smoking cessation counseling provided.    Disposition:  Follow up  5 months  Signed Myrene Bougher Verne Spurr, MD, 07/23/2023 1:19 PM    [facility] at [facility] [address] [place], [place], [place] [address]

## 2023-07-23 NOTE — Patient Instructions (Addendum)
Medication Instructions:  Your physician has recommended you make the following change in your medication:  Take Torsemide .05 tablet once daily for a week then take 0.5 tablet every other day. If you have swelling, Shortness of breath with weight gain take additional tablet.. Continue taking all other medications as prescribed  Labwork: None  Testing/Procedures: Your physician has recommended that you have a pulmonary function test. Pulmonary Function Tests are a group of tests that measure how well air moves in and out of your lungs.   Follow-Up: Your physician recommends that you schedule a follow-up appointment in: 5 months  Any Other Special Instructions Will Be Listed Below (If Applicable). HEART FAILURE INSTRUCTION SHEET  Follow a low-salt diet-you are allowed no more than 2,000 mg of sodium per day. Watch your fluid intake. In general, you should not be taking more than 64 ounces a day (no more than 8 glasses per day). Sometimes we refer to this as "2 liters per day." This includes sources of water in food like soup, coffee, tea, milk etc. Weigh yourself on the same scale at the same time of the day preferably immediately after your first void. Keep a log of your weights. Call your doctor: (Anytime you feel any of the following symptoms)  3 lbs weight gain overnight or 5 lbs within a week Shortness of breath, with or without a day hacking cough Swelling in hands, feet or stomach If you have to sleep on extra pillows at night in order to breathe   IT IS IMPORTANT TO LET YOUR DOCTOR KNOW EARLY ON IF YOU ARE HAVING SYMPTOMS SO WE CAN HELP YOU!   If you need a refill on your cardiac medications before your next appointment, please call your pharmacy.

## 2023-07-26 ENCOUNTER — Other Ambulatory Visit: Payer: Self-pay | Admitting: Acute Care

## 2023-07-26 DIAGNOSIS — Z87891 Personal history of nicotine dependence: Secondary | ICD-10-CM

## 2023-07-26 DIAGNOSIS — F17210 Nicotine dependence, cigarettes, uncomplicated: Secondary | ICD-10-CM

## 2023-07-26 DIAGNOSIS — Z122 Encounter for screening for malignant neoplasm of respiratory organs: Secondary | ICD-10-CM

## 2023-07-29 ENCOUNTER — Other Ambulatory Visit: Payer: Self-pay | Admitting: Family Medicine

## 2023-07-29 DIAGNOSIS — R053 Chronic cough: Secondary | ICD-10-CM

## 2023-07-29 DIAGNOSIS — Z720 Tobacco use: Secondary | ICD-10-CM

## 2023-08-05 ENCOUNTER — Other Ambulatory Visit (HOSPITAL_COMMUNITY)
Admission: RE | Admit: 2023-08-05 | Discharge: 2023-08-05 | Disposition: A | Discharge status: Home / Self Care | Payer: Medicaid Other | Source: Ambulatory Visit | Attending: Internal Medicine | Admitting: Internal Medicine

## 2023-08-05 ENCOUNTER — Ambulatory Visit: Payer: Medicaid Other | Admitting: Internal Medicine

## 2023-08-05 ENCOUNTER — Encounter: Payer: Self-pay | Admitting: Internal Medicine

## 2023-08-05 DIAGNOSIS — Z79899 Other long term (current) drug therapy: Secondary | ICD-10-CM | POA: Insufficient documentation

## 2023-08-05 DIAGNOSIS — I2699 Other pulmonary embolism without acute cor pulmonale: Secondary | ICD-10-CM | POA: Insufficient documentation

## 2023-08-05 LAB — BASIC METABOLIC PANEL
Anion gap: 8 (ref 5–15)
BUN: 10 mg/dL (ref 6–20)
CO2: 32 mmol/L (ref 22–32)
Calcium: 9.3 mg/dL (ref 8.9–10.3)
Chloride: 100 mmol/L (ref 98–111)
Creatinine, Ser: 0.73 mg/dL (ref 0.61–1.24)
GFR, Estimated: >60 mL/min (ref >60)
Glucose, Bld: 121 mg/dL — ABNORMAL HIGH (ref 70–99)
Potassium: 4.1 mmol/L (ref 3.5–5.1)
Sodium: 140 mmol/L (ref 135–145)

## 2023-08-05 NOTE — Progress Notes (Deleted)
Alan [name], male    DOB: 1966/03/26    MRN: [id number]   Brief patient profile:  19 yobm  active smoker in [employer] x 4 y [place] clinic [place]  05/12/2023 by Alan Sia NP at [facility] for copd eval with onset of doe around 2019 but already slowed down by severe R knee problems    Had aecopd vs pna in may 2024 rx abx / prednisone and no worse since quit     History of Present Illness  05/12/2023  Pulmonary/ 1st office eval/ Alan [name] / [place] [facility] dulera 100 Chief Complaint  Patient presents with   Consult  Dyspnea:  struggle to get to car - stops half a flight of steps  Walks to park maybe 50 ft and has stop  Cough:congested/rattlng/ min productive was green now mostly white  Sleep: in a recliner 45 degrees or couch with 2 pillows  SABA use: just dulera  02: none  Rec Plan A = Automatic = Always=    Dulera 100 x 2 puffs first thing each am/ spiriva 2.5 x 2 puffs then 12 hours later just 2 more puffs of the dulera  Work on inhaler technique: The key is to stop smoking completely before smoking completely stops you!   Please schedule a follow up visit in 3 months but call sooner if needed with PFTs > not done as of 08/05/2023    08/05/2023  f/u ov/[place] office/Alan [name] re: COPD GOLD  ?   maint on ***  No chief complaint on file.   Dyspnea:  *** Cough: *** Sleeping: ***   resp cc  SABA use: *** 02: ***  Lung cancer screening: ***   No obvious day to day or daytime variability or assoc excess/ purulent sputum or mucus plugs or hemoptysis or cp or chest tightness, subjective wheeze or overt sinus or hb symptoms.    Also denies any obvious fluctuation of symptoms with weather or environmental changes or other aggravating or alleviating factors except as outlined above   No unusual exposure hx or h/o childhood pna/ asthma or knowledge of premature birth.  Current Allergies, Complete Past Medical History, Past Surgical History, Family History, and  Social History were reviewed in [employer] record.  ROS  The following are not active complaints unless bolded Hoarseness, sore throat, dysphagia, dental problems, itching, sneezing,  nasal congestion or discharge of excess mucus or purulent secretions, ear ache,   fever, chills, sweats, unintended wt loss or wt gain, classically pleuritic or exertional cp,  orthopnea pnd or arm/hand swelling  or leg swelling, presyncope, palpitations, abdominal pain, anorexia, nausea, vomiting, diarrhea  or change in bowel habits or change in bladder habits, change in stools or change in urine, dysuria, hematuria,  rash, arthralgias, visual complaints, headache, numbness, weakness or ataxia or problems with walking or coordination,  change in mood or  memory.        No outpatient medications have been marked as taking for the 08/05/23 encounter (Appointment) with Nyoka Cowden, MD.               Objective:     Wt Readings from Last 3 Encounters:  07/23/23 113 lb 6.4 oz (51.4 kg)  06/28/23 115 lb (52.2 kg)  06/02/23 112 lb (50.8 kg)      Vital signs reviewed  08/05/2023  - Note at rest 02 sats  ***% on ***   General appearance:    ***  edentulous   Mild barr***        I personally reviewed images and agree with radiology impression as follows:   Chest CTa    07/14/23 1. No evidence of pulmonary embolism or other acute cardiopulmonary pathology. 2. Borderline enlarged main pulmonary artery which can be seen with pulmonary hypertension. 3. Mild centrilobular emphysema.   Assessment

## 2023-08-09 ENCOUNTER — Telehealth: Payer: Self-pay

## 2023-08-09 NOTE — Telephone Encounter (Signed)
-----   Message from Vishnu P Mallipeddi sent at 08/08/2023  2:32 PM EDT ----- Normal serum creatinine, continue current medications.

## 2023-08-09 NOTE — Telephone Encounter (Signed)
Patient informed and verbalized understanding of plan. 

## 2023-08-17 ENCOUNTER — Other Ambulatory Visit: Payer: Self-pay

## 2023-08-17 ENCOUNTER — Telehealth: Payer: Self-pay | Admitting: Internal Medicine

## 2023-08-17 DIAGNOSIS — I272 Pulmonary hypertension, unspecified: Secondary | ICD-10-CM

## 2023-08-17 NOTE — Telephone Encounter (Signed)
Checking percert on the following patient for testing scheduled at [facility].     Nm Pulmonary Vent and Perf  08/19/2023

## 2023-08-18 ENCOUNTER — Ambulatory Visit (HOSPITAL_COMMUNITY)
Admission: RE | Admit: 2023-08-18 | Discharge: 2023-08-18 | Disposition: A | Discharge status: Home / Self Care | Payer: Medicaid Other | Source: Ambulatory Visit | Attending: Internal Medicine | Admitting: Internal Medicine

## 2023-08-18 DIAGNOSIS — I272 Pulmonary hypertension, unspecified: Secondary | ICD-10-CM | POA: Diagnosis present

## 2023-08-19 ENCOUNTER — Telehealth: Payer: Self-pay | Admitting: Internal Medicine

## 2023-08-19 ENCOUNTER — Encounter (HOSPITAL_COMMUNITY): Payer: Self-pay

## 2023-08-19 ENCOUNTER — Encounter (HOSPITAL_COMMUNITY)
Admission: RE | Admit: 2023-08-19 | Discharge: 2023-08-19 | Disposition: A | Discharge status: Home / Self Care | Payer: Medicaid Other | Source: Ambulatory Visit | Attending: Internal Medicine | Admitting: Internal Medicine

## 2023-08-19 ENCOUNTER — Other Ambulatory Visit: Payer: Self-pay | Admitting: Internal Medicine

## 2023-08-19 DIAGNOSIS — I5032 Chronic diastolic (congestive) heart failure: Secondary | ICD-10-CM

## 2023-08-19 DIAGNOSIS — I2699 Other pulmonary embolism without acute cor pulmonale: Secondary | ICD-10-CM

## 2023-08-19 DIAGNOSIS — I272 Pulmonary hypertension, unspecified: Secondary | ICD-10-CM | POA: Insufficient documentation

## 2023-08-19 DIAGNOSIS — R0609 Other forms of dyspnea: Secondary | ICD-10-CM

## 2023-08-19 MED ORDER — TECHNETIUM TO 99M ALBUMIN AGGREGATED
4.0000 | Freq: Once | INTRAVENOUS | Status: AC | PRN
  Administered 2023-08-19: 4 via INTRAVENOUS

## 2023-08-19 NOTE — Telephone Encounter (Signed)
[facility] is calling requesting orders for Chest X-Ray 2 view. Patient has appt at 11 A.M. today 10/10.

## 2023-08-19 NOTE — Telephone Encounter (Signed)
Order placed

## 2023-08-27 ENCOUNTER — Telehealth: Payer: Self-pay | Admitting: Internal Medicine

## 2023-08-27 NOTE — Telephone Encounter (Signed)
Patient is calling to get his echo results. Said that no one has contacted him about going over results

## 2023-08-30 NOTE — Telephone Encounter (Signed)
Called patient regarding results for Echo. Advised him that due to his results for his Echo is why he had to do the CT Angio Chest. Advised him of appointment for PFT in Nov. Patient verbalized understanding.

## 2023-09-12 ENCOUNTER — Telehealth: Payer: Self-pay | Admitting: Cardiology

## 2023-09-12 NOTE — Telephone Encounter (Signed)
Outpatient service line: CC: Abnormal V/Q high probability PE  Received report by radiology that patient had high probability for PE.  Upon chart review patient has had previous negative CTA in September.  Underwent VQ scan 08/19/2023 however now just receiving results 09/12/2023.  Assuming studies were done to assess for possible chronic PE given RV dysfunction on echocardiogram.  Patient was called and denies any significant symptoms of chest pain or shortness of breath.  Has chronic COPD and is a smoker.  Given possibly questionable results and chronic findings will defer to primary cardiologist whether or not to repeat CTA and further recommendations.  Patient has been given ED precautions if he becomes more short of breath or has acute chest pain.

## 2023-09-13 ENCOUNTER — Other Ambulatory Visit: Payer: Self-pay

## 2023-09-13 ENCOUNTER — Encounter (HOSPITAL_COMMUNITY): Payer: Self-pay

## 2023-09-13 ENCOUNTER — Emergency Department (HOSPITAL_COMMUNITY): Payer: Medicaid Other

## 2023-09-13 ENCOUNTER — Telehealth: Payer: Self-pay | Admitting: Internal Medicine

## 2023-09-13 ENCOUNTER — Emergency Department (HOSPITAL_COMMUNITY)
Admission: EM | Admit: 2023-09-13 | Discharge: 2023-09-13 | Disposition: A | Discharge status: Home / Self Care | Payer: Medicaid Other | Attending: Emergency Medicine | Admitting: Emergency Medicine

## 2023-09-13 DIAGNOSIS — Z7901 Long term (current) use of anticoagulants: Secondary | ICD-10-CM | POA: Diagnosis not present

## 2023-09-13 DIAGNOSIS — Z79899 Other long term (current) drug therapy: Secondary | ICD-10-CM | POA: Insufficient documentation

## 2023-09-13 DIAGNOSIS — Z7982 Long term (current) use of aspirin: Secondary | ICD-10-CM | POA: Diagnosis not present

## 2023-09-13 DIAGNOSIS — Z9289 Personal history of other medical treatment: Secondary | ICD-10-CM

## 2023-09-13 DIAGNOSIS — Z8619 Personal history of other infectious and parasitic diseases: Secondary | ICD-10-CM | POA: Insufficient documentation

## 2023-09-13 DIAGNOSIS — R0602 Shortness of breath: Secondary | ICD-10-CM | POA: Insufficient documentation

## 2023-09-13 DIAGNOSIS — Z7951 Long term (current) use of inhaled steroids: Secondary | ICD-10-CM | POA: Insufficient documentation

## 2023-09-13 DIAGNOSIS — J449 Chronic obstructive pulmonary disease, unspecified: Secondary | ICD-10-CM | POA: Insufficient documentation

## 2023-09-13 LAB — RAPID URINE DRUG SCREEN, HOSP PERFORMED
Amphetamines: NOT DETECTED
Barbiturates: NOT DETECTED
Benzodiazepines: NOT DETECTED
Cocaine: POSITIVE — AB
Opiates: NOT DETECTED
Tetrahydrocannabinol: NOT DETECTED

## 2023-09-13 LAB — BASIC METABOLIC PANEL
Anion gap: 9 (ref 5–15)
BUN: 7 mg/dL (ref 6–20)
CO2: 28 mmol/L (ref 22–32)
Calcium: 8.7 mg/dL — ABNORMAL LOW (ref 8.9–10.3)
Chloride: 92 mmol/L — ABNORMAL LOW (ref 98–111)
Creatinine, Ser: 1.01 mg/dL (ref 0.61–1.24)
GFR, Estimated: >60 mL/min (ref >60)
Glucose, Bld: 85 mg/dL (ref 70–99)
Potassium: 3.6 mmol/L (ref 3.5–5.1)
Sodium: 129 mmol/L — ABNORMAL LOW (ref 135–145)

## 2023-09-13 LAB — CBC WITH DIFFERENTIAL/PLATELET
Abs Immature Granulocytes: 0.02 10*3/uL (ref 0.00–0.07)
Basophils Absolute: 0.1 10*3/uL (ref 0.0–0.1)
Basophils Relative: 1 %
Eosinophils Absolute: 0.1 10*3/uL (ref 0.0–0.5)
Eosinophils Relative: 1 %
HCT: 41.9 % (ref 39.0–52.0)
Hemoglobin: 14 g/dL (ref 13.0–17.0)
Immature Granulocytes: 0 %
Lymphocytes Relative: 25 %
Lymphs Abs: 1.8 10*3/uL (ref 0.7–4.0)
MCH: 31.9 pg (ref 26.0–34.0)
MCHC: 33.4 g/dL (ref 30.0–36.0)
MCV: 95.4 fL (ref 80.0–100.0)
Monocytes Absolute: 0.6 10*3/uL (ref 0.1–1.0)
Monocytes Relative: 9 %
Neutro Abs: 4.5 10*3/uL (ref 1.7–7.7)
Neutrophils Relative %: 64 %
Platelets: 355 10*3/uL (ref 150–400)
RBC: 4.39 MIL/uL (ref 4.22–5.81)
RDW: 13.5 % (ref 11.5–15.5)
WBC: 7.1 10*3/uL (ref 4.0–10.5)
nRBC: 0 % (ref 0.0–0.2)

## 2023-09-13 MED ORDER — APIXABAN 5 MG PO TABS: ORAL_TABLET | ORAL | 11 refills | Status: DC

## 2023-09-13 NOTE — ED Provider Notes (Signed)
[place] EMERGENCY DEPARTMENT AT [facility] Provider Note   CSN: [id number] Arrival date & time: 09/13/23  1123     History {Add pertinent medical, surgical, social history, OB history to HPI:1} Chief Complaint  Patient presents with   Shortness of Breath    Alan [name] is a 57 y.o. male.  Sent to the ER today for evaluation due to high probability acute scan.  Dr. Ancil Boozer sent him in for bilateral lower extremity ultrasounds and to make sure he was not hypoxic or having any shortness of breath symptoms.  He states his shortness of breath is at his baseline he continues to smoke with his COPD not on any home oxygen.  Not having increased cough, no chest pain.   Shortness of Breath      Home Medications Prior to Admission medications   Medication Sig Start Date End Date Taking? Authorizing Provider  acetaminophen (TYLENOL) 500 MG tablet Take 250-500 mg by mouth every 6 (six) hours as needed for moderate pain or headache.    [provider]  apixaban (ELIQUIS) 5 MG TABS tablet Take 2 tablets (10 mg total) by mouth 2 (two) times daily for 7 days, THEN 1 tablet (5 mg total) 2 (two) times daily. 09/13/23 09/19/24  Mallipeddi, Orion Modest, MD  aspirin-acetaminophen-caffeine (EXCEDRIN MIGRAINE) [id number] MG tablet Take 1 tablet by mouth every 6 (six) hours as needed for headache.    [provider]  DULERA 100-5 MCG/ACT AERO INHALE 2 PUFFS INTO THE LUNGS TWICE DAILY 07/29/23   Milian, Aleen Campi, FNP  famotidine-calcium carbonate-magnesium hydroxide (PEPCID COMPLETE) 10-800-165 MG chewable tablet Chew 1 tablet by mouth daily as needed (acid reflux).    [provider]  ibuprofen (ADVIL) 600 MG tablet Take 1 tablet (600 mg total) by mouth every 8 (eight) hours as needed. 04/13/22   Daryll Drown, NP  Menthol, Topical Analgesic, (BIOFREEZE EX) Apply 1 application topically daily as needed (pain).    [provider]  Tetrahydrozoline  HCl (VISINE OP) Place 1 drop into both eyes daily as needed (dry eyes).    [provider]  Tiotropium Bromide Monohydrate (SPIRIVA RESPIMAT) 2.5 MCG/ACT AERS 2 puffs each am 05/12/23   Nyoka Cowden, MD  torsemide (DEMADEX) 20 MG tablet Take 0.5 tablets (10 mg total) by mouth daily. 07/16/23   Mallipeddi, Orion Modest, MD      Allergies    Patient has no known allergies.    Review of Systems   Review of Systems  Respiratory:  Positive for shortness of breath.     Physical Exam Updated Vital Signs BP 120/86 (BP Location: Right Arm)   Pulse 90   Temp 98.7 F (37.1 C) (Oral)   Resp 18   Ht 5\' 9"  (1.753 m)   Wt 59 kg   SpO2 98%   BMI 19.20 kg/m  Physical Exam  ED Results / Procedures / Treatments   Labs (all labs ordered are listed, but only abnormal results are displayed) Labs Reviewed - No data to display  EKG None  Radiology No results found.  Procedures Procedures  {Document cardiac monitor, telemetry assessment procedure when appropriate:1}  Medications Ordered in ED Medications - No data to display  ED Course/ Medical Decision Making/ A&P   {   Click here for ABCD2, HEART and other calculatorsREFRESH Note before signing :1}  Medical Decision Making Amount and/or Complexity of Data Reviewed Labs: ordered.   ***  {Document critical care time when appropriate:1} {Document review of labs and clinical decision tools ie heart score, Chads2Vasc2 etc:1}  {Document your independent review of radiology images, and any outside records:1} {Document your discussion with family members, caretakers, and with consultants:1} {Document social determinants of health affecting pt's care:1} {Document your decision making why or why not admission, treatments were needed:1} Final Clinical Impression(s) / ED Diagnoses Final diagnoses:  None    Rx / DC Orders ED Discharge Orders     None

## 2023-09-13 NOTE — Addendum Note (Signed)
Addended by: Carmelina Paddock on: 09/13/2023 09:52 AM   Modules accepted: Orders

## 2023-09-13 NOTE — Telephone Encounter (Signed)
Patient informed and verbalized understanding of plan. Patient will go to [facility] ED

## 2023-09-13 NOTE — ED Triage Notes (Signed)
Pt to er, pt states that [facility] called him a prescription for a blood thinner, states that he was dx with a blood clot in his lung.  States that they also called him and told him to come to [facility] to make sure he didn't have any "blockages".  Pt talking in full sentences.

## 2023-09-13 NOTE — Discharge Instructions (Signed)
You are seen in the emergency department today for evaluation after having a positive VQ scan.  This is a test that can tell if there is low moderate or high probability of having a blood clot in your lungs.  Your results showed high probability so your cardiologist ordered you blood thinners which she would like you to start taking.  She requested that we obtain ultrasound of your legs.  These were negative for any blood clot.  She will see you in the office in 2 weeks.  You can discuss with her then if they plan to continue the blood thinners or want to repeat your CT scan.  You develop shortness of breath, chest pain or any other new or worsening symptoms come back to the ER.

## 2023-09-13 NOTE — Telephone Encounter (Signed)
Patient called stating that he had his test at [facility] today. He states he was told no blockages. Does he need to start taking the Eliquis?

## 2023-09-14 NOTE — Telephone Encounter (Signed)
Left patient detailed message per DPR  

## 2023-09-20 ENCOUNTER — Ambulatory Visit: Payer: Medicaid Other | Attending: Internal Medicine | Admitting: Internal Medicine

## 2023-09-20 ENCOUNTER — Telehealth: Payer: Self-pay | Admitting: Internal Medicine

## 2023-09-20 VITALS — BP 100/68 | HR 98 | Ht 68.0 in | Wt 116.6 lb

## 2023-09-20 DIAGNOSIS — I517 Cardiomegaly: Secondary | ICD-10-CM | POA: Insufficient documentation

## 2023-09-20 DIAGNOSIS — R000 Tachycardia, unspecified: Secondary | ICD-10-CM

## 2023-09-20 DIAGNOSIS — I272 Pulmonary hypertension, unspecified: Secondary | ICD-10-CM | POA: Insufficient documentation

## 2023-09-20 DIAGNOSIS — Z01810 Encounter for preprocedural cardiovascular examination: Secondary | ICD-10-CM

## 2023-09-20 DIAGNOSIS — I2724 Chronic thromboembolic pulmonary hypertension: Secondary | ICD-10-CM | POA: Diagnosis not present

## 2023-09-20 NOTE — Progress Notes (Signed)
**Note Alan-Identified via Obfuscation** Cardiology Office Note  Date: 09/20/2023   ID: DEMORION [name], DOB 07-28-1966, MRN [id number]  PCP:  Alan Senate, FNP  Cardiologist:  Alan Bicker, MD Electrophysiologist:  None    History of Present Illness: Alan [name] is a 57 y.o. 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 VQ scan obtained recently on 08/19/2023 showed multiple bilateral wedge-shaped perfusion defects consistent with pulmonary embolism.  DOE improved with inhalers but not much with torsemide.  Currently taking torsemide 10 mg every other day.  Denies having any other symptoms of chest pain, dizziness, syncope, palpitations, leg swelling.  Has not been hypoxic.  He was sent to the ER after VQ scan results, ultrasound venous lower extremity showed no evidence of DVT.  ABI within normal limits.  He used to smoke 1 and half packs of cigarettes daily, currently cut down to 1 pack/day.   Past Medical History:  Diagnosis Date   Right knee pain    Right shoulder pain     Past Surgical History:  Procedure Laterality Date   TONSILLECTOMY  57 yo    Current Outpatient Medications  Medication Sig Dispense Refill   acetaminophen (TYLENOL) 500 MG tablet Take 250-500 mg by mouth every 6 (six) hours as needed for moderate pain or headache.     apixaban (ELIQUIS) 5 MG TABS tablet Take 2 tablets (10 mg total) by mouth 2 (two) times daily for 7 days, THEN 1 tablet (5 mg total) 2 (two) times daily. 60 tablet 11   aspirin-acetaminophen-caffeine (EXCEDRIN MIGRAINE) 250-250-65 MG tablet Take 1 tablet by mouth every 6 (six) hours as needed for headache.     DULERA 100-5 MCG/ACT AERO INHALE 2 PUFFS INTO THE LUNGS TWICE DAILY 13 g 2   famotidine-calcium carbonate-magnesium  hydroxide (PEPCID COMPLETE) 10-800-165 MG chewable tablet Chew 1 tablet by mouth daily as needed (acid reflux).     ibuprofen (ADVIL) 600 MG tablet Take 1 tablet (600 mg total) by mouth every 8 (eight) hours as needed. 30 tablet 0   Menthol, Topical Analgesic, (BIOFREEZE EX) Apply 1 application topically daily as needed (pain).     Tetrahydrozoline HCl (VISINE OP) Place 1 drop into both eyes daily as needed (dry eyes).     Tiotropium Bromide Monohydrate (SPIRIVA RESPIMAT) 2.5 MCG/ACT AERS 2 puffs each am 4 g 11   torsemide (DEMADEX) 20 MG tablet Take 10 mg by mouth every other day.     No current facility-administered medications for this visit.   Allergies:  Patient has no known allergies.   Social History: The patient  reports that he has been smoking cigarettes. He started smoking about 42 years ago. He has a 63.4 pack-year smoking history. He has never used smokeless tobacco. He reports current alcohol use. He reports current drug use. Drug: Marijuana.   Family History: The patient's family history includes Asthma in his brother; Diabetes in his father and mother; Heart disease in his mother.   ROS:  Please see the history of present illness. Otherwise, complete review of systems is positive for none  All other systems are reviewed and negative.   Physical Exam: VS:  BP 100/68   Pulse 98   Ht 5\' 8"  (1.727 m)   Wt 116 lb 9.6 oz (52.9 kg)  SpO2 93%   BMI 17.73 kg/m , BMI Body mass index is 17.73 kg/m.  Wt Readings from Last 3 Encounters:  09/20/23 116 lb 9.6 oz (52.9 kg)  09/13/23 130 lb (59 kg)  07/23/23 113 lb 6.4 oz (51.4 kg)    General: Patient appears comfortable at rest. HEENT: Conjunctiva and lids normal, oropharynx clear with moist mucosa. Neck: Supple, no elevated JVP or carotid bruits, no thyromegaly. Lungs: Clear to auscultation, nonlabored breathing at rest. Cardiac: Regular rate and rhythm, no S3 or significant systolic murmur, no pericardial rub. Abdomen: Soft,  nontender, no hepatomegaly, bowel sounds present, no guarding or rebound. Extremities: No pitting edema, distal pulses 2+. Skin: Warm and dry. Musculoskeletal: No kyphosis. Neuropsychiatric: Alert and oriented x3, affect grossly appropriate.  Recent Labwork: 04/01/2023: ALT 14; AST 15; TSH 2.820 09/13/2023: BUN 7; Creatinine, Ser 1.01; Hemoglobin 14.0; Platelets 355; Potassium 3.6; Sodium 129     Component Value Date/Time   CHOL 187 04/01/2023 1554   TRIG 131 04/01/2023 1554   HDL 87 04/01/2023 1554   CHOLHDL 2.1 04/01/2023 1554   LDLCALC 78 04/01/2023 1554    Assessment and Plan:  CTEPH (VQ scan showed multiple bilateral wedge-shaped perfusion defects consistent with PE, CTPA negative for PE prior to the study) Moderate to severe RV dilatation with moderate pulmonary hypertension COPD not on home O2 Chronic diastolic heart failure, compensated Nicotine abuse (Cut down from 1.5 to 1 pack/day)  -Patient was initially referred to cardiology clinic for DOE, echocardiogram from 7/24 showed normal LVEF, mild RV dysfunction, moderate to severe RV dilatation with moderate elevation of PASP. CT angio chest was negative for PE, later VQ scan was obtained that showed multiple bilateral wedge-shaped perfusion defects consistent with PE.  He has not been hypoxic, USG venous LE Doppler was negative for DVT. He was started on Eliquis 10 mg twice daily for 7 days followed by 5 mg twice daily after VQ scan was resulted.  He will benefit from RHC for evaluation of pulmonary hypertension. -Continues to have DOE but able to perform his ADLs and can function okay.  He reported this DOE was ongoing for the last 1 to 2 years.  Will continue torsemide 10 mg every other day for now and based on RHC findings, will need to make changes to his torsemide dose. -Risks and benefits of cardiac catheterization have been discussed with the patient.  These include bleeding, infection, kidney damage, stroke, heart attack,  death.  The patient understands these risks and is willing to proceed. -RHC scheduled on 10/13/2023.  No need to hold [facility] however per hospital protocol, will need to hold Eliquis for 1 day prior to RHC. -Discussed smoking cessation, and congratulated for cutting back on cigarette smoking from 1.5 pack to 1 pack/day. -Follows with pulmonology for COPD management.   I spent a total duration 30 minutes during the prior medications, labs, EKG, reports, face-to-face discussion/counseling of his medical condition, pathophysiology, evaluation, management, ordering test/procedures and documenting the findings in the note.   Disposition:  Follow up  3 months  Signed Asir Bingley Verne Spurr, MD, 09/20/2023 11:35 AM    [facility] [address] [place], [place], [place] [address]

## 2023-09-20 NOTE — H&P (View-Only) (Signed)
 Cardiology Office Note  Date: 09/20/2023   ID: DEMORION ONDO, DOB 07-28-1966, MRN [id number]  PCP:  Arrie Senate, FNP  Cardiologist:  Marjo Bicker, MD Electrophysiologist:  None    History of Present Illness: Alan [name] is a 57 y.o. 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 VQ scan obtained recently on 08/19/2023 showed multiple bilateral wedge-shaped perfusion defects consistent with pulmonary embolism.  DOE improved with inhalers but not much with torsemide.  Currently taking torsemide 10 mg every other day.  Denies having any other symptoms of chest pain, dizziness, syncope, palpitations, leg swelling.  Has not been hypoxic.  He was sent to the ER after VQ scan results, ultrasound venous lower extremity showed no evidence of DVT.  ABI within normal limits.  He used to smoke 1 and half packs of cigarettes daily, currently cut down to 1 pack/day.   Past Medical History:  Diagnosis Date   Right knee pain    Right shoulder pain     Past Surgical History:  Procedure Laterality Date   TONSILLECTOMY  57 yo    Current Outpatient Medications  Medication Sig Dispense Refill   acetaminophen (TYLENOL) 500 MG tablet Take 250-500 mg by mouth every 6 (six) hours as needed for moderate pain or headache.     apixaban (ELIQUIS) 5 MG TABS tablet Take 2 tablets (10 mg total) by mouth 2 (two) times daily for 7 days, THEN 1 tablet (5 mg total) 2 (two) times daily. 60 tablet 11   aspirin-acetaminophen-caffeine (EXCEDRIN MIGRAINE) 250-250-65 MG tablet Take 1 tablet by mouth every 6 (six) hours as needed for headache.     DULERA 100-5 MCG/ACT AERO INHALE 2 PUFFS INTO THE LUNGS TWICE DAILY 13 g 2   famotidine-calcium carbonate-magnesium  hydroxide (PEPCID COMPLETE) 10-800-165 MG chewable tablet Chew 1 tablet by mouth daily as needed (acid reflux).     ibuprofen (ADVIL) 600 MG tablet Take 1 tablet (600 mg total) by mouth every 8 (eight) hours as needed. 30 tablet 0   Menthol, Topical Analgesic, (BIOFREEZE EX) Apply 1 application topically daily as needed (pain).     Tetrahydrozoline HCl (VISINE OP) Place 1 drop into both eyes daily as needed (dry eyes).     Tiotropium Bromide Monohydrate (SPIRIVA RESPIMAT) 2.5 MCG/ACT AERS 2 puffs each am 4 g 11   torsemide (DEMADEX) 20 MG tablet Take 10 mg by mouth every other day.     No current facility-administered medications for this visit.   Allergies:  Patient has no known allergies.   Social History: The patient  reports that he has been smoking cigarettes. He started smoking about 42 years ago. He has a 63.4 pack-year smoking history. He has never used smokeless tobacco. He reports current alcohol use. He reports current drug use. Drug: Marijuana.   Family History: The patient's family history includes Asthma in his brother; Diabetes in his father and mother; Heart disease in his mother.   ROS:  Please see the history of present illness. Otherwise, complete review of systems is positive for none  All other systems are reviewed and negative.   Physical Exam: VS:  BP 100/68   Pulse 98   Ht 5\' 8"  (1.727 m)   Wt 116 lb 9.6 oz (52.9 kg)  SpO2 93%   BMI 17.73 kg/m , BMI Body mass index is 17.73 kg/m.  Wt Readings from Last 3 Encounters:  09/20/23 116 lb 9.6 oz (52.9 kg)  09/13/23 130 lb (59 kg)  07/23/23 113 lb 6.4 oz (51.4 kg)    General: Patient appears comfortable at rest. HEENT: Conjunctiva and lids normal, oropharynx clear with moist mucosa. Neck: Supple, no elevated JVP or carotid bruits, no thyromegaly. Lungs: Clear to auscultation, nonlabored breathing at rest. Cardiac: Regular rate and rhythm, no S3 or significant systolic murmur, no pericardial rub. Abdomen: Soft,  nontender, no hepatomegaly, bowel sounds present, no guarding or rebound. Extremities: No pitting edema, distal pulses 2+. Skin: Warm and dry. Musculoskeletal: No kyphosis. Neuropsychiatric: Alert and oriented x3, affect grossly appropriate.  Recent Labwork: 04/01/2023: ALT 14; AST 15; TSH 2.820 09/13/2023: BUN 7; Creatinine, Ser 1.01; Hemoglobin 14.0; Platelets 355; Potassium 3.6; Sodium 129     Component Value Date/Time   CHOL 187 04/01/2023 1554   TRIG 131 04/01/2023 1554   HDL 87 04/01/2023 1554   CHOLHDL 2.1 04/01/2023 1554   LDLCALC 78 04/01/2023 1554    Assessment and Plan:  CTEPH (VQ scan showed multiple bilateral wedge-shaped perfusion defects consistent with PE, CTPA negative for PE prior to the study) Moderate to severe RV dilatation with moderate pulmonary hypertension COPD not on home O2 Chronic diastolic heart failure, compensated Nicotine abuse (Cut down from 1.5 to 1 pack/day)  -Patient was initially referred to cardiology clinic for DOE, echocardiogram from 7/24 showed normal LVEF, mild RV dysfunction, moderate to severe RV dilatation with moderate elevation of PASP. CT angio chest was negative for PE, later VQ scan was obtained that showed multiple bilateral wedge-shaped perfusion defects consistent with PE.  He has not been hypoxic, USG venous LE Doppler was negative for DVT. He was started on Eliquis 10 mg twice daily for 7 days followed by 5 mg twice daily after VQ scan was resulted.  He will benefit from RHC for evaluation of pulmonary hypertension. -Continues to have DOE but able to perform his ADLs and can function okay.  He reported this DOE was ongoing for the last 1 to 2 years.  Will continue torsemide 10 mg every other day for now and based on RHC findings, will need to make changes to his torsemide dose. -Risks and benefits of cardiac catheterization have been discussed with the patient.  These include bleeding, infection, kidney damage, stroke, heart attack,  death.  The patient understands these risks and is willing to proceed. -RHC scheduled on 10/13/2023.  No need to hold [facility] however per hospital protocol, will need to hold Eliquis for 1 day prior to RHC. -Discussed smoking cessation, and congratulated for cutting back on cigarette smoking from 1.5 pack to 1 pack/day. -Follows with pulmonology for COPD management.   I spent a total duration 30 minutes during the prior medications, labs, EKG, reports, face-to-face discussion/counseling of his medical condition, pathophysiology, evaluation, management, ordering test/procedures and documenting the findings in the note.   Disposition:  Follow up  3 months  Signed Asir Bingley Verne Spurr, MD, 09/20/2023 11:35 AM    [facility] at [facility] [address] [place], [place], [place] [address]

## 2023-09-20 NOTE — Telephone Encounter (Signed)
Checking percert on the following   RHC scheduled on December 4 @10 :00 with Dr. Herbie Baltimore

## 2023-09-20 NOTE — Patient Instructions (Addendum)
Medication Instructions:  Your physician recommends that you continue on your current medications as directed. Please refer to the Current Medication list given to you today.   Labwork: CBC and BMET to be completed next week at [facility]  Testing/Procedures:  [place] [facility] DEPT OF [facility] [address] [place] [address] Dept: [phone] Loc: [phone]  Alan [name]  09/20/2023  You are scheduled for a Cardiac Catheterization on Wednesday, December 4 with Dr. Bryan Lemma.  1. Please arrive at the [facility] (Main Entrance A) at [facility]: [address] [place], [place] [address] at 8:00 AM (This time is 2 hour(s) before your procedure to ensure your preparation). Free valet parking service is available. You will check in at ADMITTING. The support person will be asked to wait in the waiting room.  It is OK to have someone drop you off and come back when you are ready to be discharged.    Special note: Every effort is made to have your procedure done on time. Please understand that emergencies sometimes delay scheduled procedures.  2. Diet: Do not eat solid foods after midnight.  The patient may have clear liquids until 5am upon the day of the procedure.  3. Labs: You will need to have blood drawn on , November 18   4. Medication instructions in preparation for your procedure:   Contrast Allergy: No    Current Outpatient Medications (Cardiovascular):    torsemide (DEMADEX) 20 MG tablet, Take 10 mg by mouth every other day.  Current Outpatient Medications (Respiratory):    DULERA 100-5 MCG/ACT AERO, INHALE 2 PUFFS INTO THE LUNGS TWICE DAILY   Tiotropium Bromide Monohydrate (SPIRIVA RESPIMAT) 2.5 MCG/ACT AERS, 2 puffs each am  Current Outpatient Medications (Analgesics):    acetaminophen (TYLENOL) 500 MG tablet, Take 250-500 mg by mouth every 6 (six) hours as needed for moderate pain  or headache.   aspirin-acetaminophen-caffeine (EXCEDRIN MIGRAINE) 250-250-65 MG tablet, Take 1 tablet by mouth every 6 (six) hours as needed for headache.   ibuprofen (ADVIL) 600 MG tablet, Take 1 tablet (600 mg total) by mouth every 8 (eight) hours as needed.  Current Outpatient Medications (Hematological):    apixaban (ELIQUIS) 5 MG TABS tablet, Take 2 tablets (10 mg total) by mouth 2 (two) times daily for 7 days, THEN 1 tablet (5 mg total) 2 (two) times daily.  Current Outpatient Medications (Other):    famotidine-calcium carbonate-magnesium hydroxide (PEPCID COMPLETE) 10-800-165 MG chewable tablet, Chew 1 tablet by mouth daily as needed (acid reflux).   Menthol, Topical Analgesic, (BIOFREEZE EX), Apply 1 application topically daily as needed (pain).   Tetrahydrozoline HCl (VISINE OP), Place 1 drop into both eyes daily as needed (dry eyes).   Stop taking, Torsemide (Demadex) Tuesday, December 3, Stop taking Eliquis on Tuesday, December 3rd and then continue taking after procedure  On the morning of your procedure, take your Aspirin 81 mg and any morning medicines NOT listed above.  You may use sips of water.  5. Plan to go home the same day, you will only stay overnight if medically necessary. 6. Bring a current list of your medications and current insurance cards. 7. You MUST have a responsible person to drive you home. 8. Someone MUST be with you the first 24 hours after you arrive home or your discharge will be delayed. 9. Please wear clothes that are easy to get on and off and  wear slip-on shoes.  Thank you for allowing [place] to care for you!   -- [facility]   Follow-Up: Your physician recommends that you schedule a follow-up appointment in: Keep current follow up in February  Any Other Special Instructions Will Be Listed Below (If Applicable).  Thank you for choosing [facility]!      If you need a refill on your cardiac medications  before your next appointment, please call your pharmacy.

## 2023-09-28 ENCOUNTER — Ambulatory Visit (HOSPITAL_COMMUNITY)
Admission: RE | Admit: 2023-09-28 | Discharge: 2023-09-28 | Disposition: A | Discharge status: Home / Self Care | Payer: Medicaid Other | Source: Ambulatory Visit | Attending: Internal Medicine | Admitting: Internal Medicine

## 2023-09-28 DIAGNOSIS — I272 Pulmonary hypertension, unspecified: Secondary | ICD-10-CM | POA: Diagnosis present

## 2023-09-28 DIAGNOSIS — R0609 Other forms of dyspnea: Secondary | ICD-10-CM | POA: Diagnosis present

## 2023-09-28 DIAGNOSIS — I2699 Other pulmonary embolism without acute cor pulmonale: Secondary | ICD-10-CM | POA: Diagnosis present

## 2023-09-28 LAB — PULMONARY FUNCTION TEST
DL/VA % pred: 60 %
DL/VA: 2.59 ml/min/mmHg/L
DLCO cor % pred: 49 %
DLCO cor: 13.1 ml/min/mmHg
DLCO unc % pred: 48 %
DLCO unc: 12.87 ml/min/mmHg
FEF 25-75 Post: 0.52 L/s
FEF 25-75 Pre: 0.47 L/s
FEF2575-%Change-Post: 10 %
FEF2575-%Pred-Post: 17 %
FEF2575-%Pred-Pre: 15 %
FEV1-%Change-Post: 7 %
FEV1-%Pred-Post: 45 %
FEV1-%Pred-Pre: 42 %
FEV1-Post: 1.62 L
FEV1-Pre: 1.51 L
FEV1FVC-%Change-Post: 1 %
FEV1FVC-%Pred-Pre: 58 %
FEV6-%Change-Post: 3 %
FEV6-%Pred-Post: 67 %
FEV6-%Pred-Pre: 65 %
FEV6-Post: 2.98 L
FEV6-Pre: 2.88 L
FEV6FVC-%Change-Post: -1 %
FEV6FVC-%Pred-Post: 87 %
FEV6FVC-%Pred-Pre: 89 %
FVC-%Change-Post: 5 %
FVC-%Pred-Post: 76 %
FVC-%Pred-Pre: 72 %
FVC-Post: 3.54 L
FVC-Pre: 3.37 L
Post FEV1/FVC ratio: 46 %
Post FEV6/FVC ratio: 84 %
Pre FEV1/FVC ratio: 45 %
Pre FEV6/FVC Ratio: 86 %
RV % pred: 168 %
RV: 3.54 L
TLC % pred: 102 %
TLC: 6.85 L

## 2023-09-28 MED ORDER — ALBUTEROL SULFATE (2.5 MG/3ML) 0.083% IN NEBU
2.5000 mg | INHALATION_SOLUTION | Freq: Once | RESPIRATORY_TRACT | Status: AC
  Administered 2023-09-28: 2.5 mg via RESPIRATORY_TRACT

## 2023-09-30 ENCOUNTER — Telehealth: Payer: Self-pay | Admitting: *Deleted

## 2023-09-30 DIAGNOSIS — J449 Chronic obstructive pulmonary disease, unspecified: Secondary | ICD-10-CM

## 2023-09-30 NOTE — Telephone Encounter (Signed)
-----   Message from Vishnu P Mallipeddi sent at 09/29/2023 12:45 PM EST ----- Severe COPD, ensure follow-ups with Pulm.

## 2023-09-30 NOTE — Telephone Encounter (Signed)
Patient informed and verbalized understanding of plan. Copy sent to PCP 

## 2023-10-10 ENCOUNTER — Other Ambulatory Visit: Payer: Self-pay | Admitting: Family Medicine

## 2023-10-10 DIAGNOSIS — Z720 Tobacco use: Secondary | ICD-10-CM

## 2023-10-10 DIAGNOSIS — R053 Chronic cough: Secondary | ICD-10-CM

## 2023-10-11 ENCOUNTER — Telehealth: Payer: Self-pay | Admitting: *Deleted

## 2023-10-11 ENCOUNTER — Encounter (INDEPENDENT_AMBULATORY_CARE_PROVIDER_SITE_OTHER): Payer: Self-pay | Admitting: *Deleted

## 2023-10-11 NOTE — Telephone Encounter (Signed)
Right Heart Cath scheduled at [facility] for: Wednesday October 13, 2023 10 AM Arrival time [facility] Main Entrance A at: 8 AM  Nothing to eat after midnight prior to procedure, clear liquids until 5 AM day of procedure.  Medication instructions: -Hold:  Eliquis-none 10/12/23 until post procedure-(1 day prior per Dr Jenene Slicker) Torsemide-AM of procedure -Other usual morning medications can be taken with sips of water.  Plan to go home the same day, you will only stay overnight if medically necessary.  You must have responsible adult to drive you home.  Someone must be with you the first 24 hours after you arrive home.  Reviewed procedure instructions with patient.

## 2023-10-11 NOTE — Telephone Encounter (Signed)
Patient tells me he will go to [facility] in the morning for pre-procedure BMP/CBC.

## 2023-10-12 ENCOUNTER — Other Ambulatory Visit (HOSPITAL_COMMUNITY)
Admission: RE | Admit: 2023-10-12 | Discharge: 2023-10-12 | Disposition: A | Discharge status: Home / Self Care | Payer: Medicaid Other | Source: Ambulatory Visit | Attending: Internal Medicine | Admitting: Internal Medicine

## 2023-10-12 DIAGNOSIS — Z01810 Encounter for preprocedural cardiovascular examination: Secondary | ICD-10-CM | POA: Insufficient documentation

## 2023-10-12 DIAGNOSIS — I272 Pulmonary hypertension, unspecified: Secondary | ICD-10-CM | POA: Insufficient documentation

## 2023-10-12 LAB — CBC
HCT: 42.7 % (ref 39.0–52.0)
Hemoglobin: 14 g/dL (ref 13.0–17.0)
MCH: 32.3 pg (ref 26.0–34.0)
MCHC: 32.8 g/dL (ref 30.0–36.0)
MCV: 98.6 fL (ref 80.0–100.0)
Platelets: 361 10*3/uL (ref 150–400)
RBC: 4.33 MIL/uL (ref 4.22–5.81)
RDW: 14.7 % (ref 11.5–15.5)
WBC: 6.6 10*3/uL (ref 4.0–10.5)
nRBC: 0 % (ref 0.0–0.2)

## 2023-10-12 LAB — BASIC METABOLIC PANEL
Anion gap: 10 (ref 5–15)
BUN: 10 mg/dL (ref 6–20)
CO2: 27 mmol/L (ref 22–32)
Calcium: 8.9 mg/dL (ref 8.9–10.3)
Chloride: 101 mmol/L (ref 98–111)
Creatinine, Ser: 0.91 mg/dL (ref 0.61–1.24)
GFR, Estimated: >60 mL/min (ref >60)
Glucose, Bld: 110 mg/dL — ABNORMAL HIGH (ref 70–99)
Potassium: 3.8 mmol/L (ref 3.5–5.1)
Sodium: 138 mmol/L (ref 135–145)

## 2023-10-13 ENCOUNTER — Encounter (HOSPITAL_COMMUNITY)
Admission: RE | Disposition: A | Discharge status: Home / Self Care | Payer: Self-pay | Source: Home / Self Care | Attending: Cardiology

## 2023-10-13 ENCOUNTER — Encounter (HOSPITAL_COMMUNITY): Payer: Self-pay | Admitting: Cardiology

## 2023-10-13 ENCOUNTER — Ambulatory Visit (HOSPITAL_COMMUNITY)
Admission: RE | Admit: 2023-10-13 | Discharge: 2023-10-13 | Disposition: A | Discharge status: Home / Self Care | Payer: Medicaid Other | Attending: Cardiology | Admitting: Cardiology

## 2023-10-13 DIAGNOSIS — I272 Pulmonary hypertension, unspecified: Secondary | ICD-10-CM | POA: Diagnosis present

## 2023-10-13 DIAGNOSIS — J449 Chronic obstructive pulmonary disease, unspecified: Secondary | ICD-10-CM | POA: Insufficient documentation

## 2023-10-13 DIAGNOSIS — I2724 Chronic thromboembolic pulmonary hypertension: Secondary | ICD-10-CM | POA: Insufficient documentation

## 2023-10-13 DIAGNOSIS — Z79899 Other long term (current) drug therapy: Secondary | ICD-10-CM | POA: Diagnosis not present

## 2023-10-13 DIAGNOSIS — R0602 Shortness of breath: Secondary | ICD-10-CM | POA: Diagnosis not present

## 2023-10-13 DIAGNOSIS — F17210 Nicotine dependence, cigarettes, uncomplicated: Secondary | ICD-10-CM | POA: Insufficient documentation

## 2023-10-13 DIAGNOSIS — F1290 Cannabis use, unspecified, uncomplicated: Secondary | ICD-10-CM | POA: Diagnosis not present

## 2023-10-13 DIAGNOSIS — I5032 Chronic diastolic (congestive) heart failure: Secondary | ICD-10-CM | POA: Insufficient documentation

## 2023-10-13 DIAGNOSIS — Z7901 Long term (current) use of anticoagulants: Secondary | ICD-10-CM | POA: Diagnosis not present

## 2023-10-13 HISTORY — PX: RIGHT HEART CATH: CATH118263

## 2023-10-13 LAB — POCT I-STAT EG7
Acid-Base Excess: 1 mmol/L (ref 0.0–2.0)
Acid-Base Excess: 1 mmol/L (ref 0.0–2.0)
Acid-Base Excess: 2 mmol/L (ref 0.0–2.0)
Acid-Base Excess: 2 mmol/L (ref 0.0–2.0)
Bicarbonate: 27.4 mmol/L (ref 20.0–28.0)
Bicarbonate: 27.9 mmol/L (ref 20.0–28.0)
Bicarbonate: 28.1 mmol/L — ABNORMAL HIGH (ref 20.0–28.0)
Bicarbonate: 28 mmol/L (ref 20.0–28.0)
Calcium, Ion: 1.22 mmol/L (ref 1.15–1.40)
Calcium, Ion: 1.22 mmol/L (ref 1.15–1.40)
Calcium, Ion: 1.26 mmol/L (ref 1.15–1.40)
Calcium, Ion: 1.22 mmol/L (ref 1.15–1.40)
HCT: 40 % (ref 39.0–52.0)
HCT: 40 % (ref 39.0–52.0)
HCT: 40 % (ref 39.0–52.0)
HCT: 40 % (ref 39.0–52.0)
Hemoglobin: 13.6 g/dL (ref 13.0–17.0)
Hemoglobin: 13.6 g/dL (ref 13.0–17.0)
Hemoglobin: 13.6 g/dL (ref 13.0–17.0)
Hemoglobin: 13.6 g/dL (ref 13.0–17.0)
O2 Saturation: 68 %
O2 Saturation: 70 %
O2 Saturation: 70 %
O2 Saturation: 69 %
Potassium: 4.1 mmol/L (ref 3.5–5.1)
Potassium: 4.1 mmol/L (ref 3.5–5.1)
Potassium: 4.2 mmol/L (ref 3.5–5.1)
Potassium: 4.1 mmol/L (ref 3.5–5.1)
Sodium: 138 mmol/L (ref 135–145)
Sodium: 139 mmol/L (ref 135–145)
Sodium: 139 mmol/L (ref 135–145)
Sodium: 139 mmol/L (ref 135–145)
TCO2: 29 mmol/L (ref 22–32)
TCO2: 29 mmol/L (ref 22–32)
TCO2: 30 mmol/L (ref 22–32)
TCO2: 30 mmol/L (ref 22–32)
pCO2, Ven: 49.5 mm[Hg] (ref 44–60)
pCO2, Ven: 50.1 mm[Hg] (ref 44–60)
pCO2, Ven: 50.5 mm[Hg] (ref 44–60)
pCO2, Ven: 50.4 mm[Hg] (ref 44–60)
pH, Ven: 7.351 (ref 7.25–7.43)
pH, Ven: 7.353 (ref 7.25–7.43)
pH, Ven: 7.354 (ref 7.25–7.43)
pH, Ven: 7.353 (ref 7.25–7.43)
pO2, Ven: 38 mm[Hg] (ref 32–45)
pO2, Ven: 39 mm[Hg] (ref 32–45)
pO2, Ven: 39 mm[Hg] (ref 32–45)
pO2, Ven: 38 mm[Hg] (ref 32–45)

## 2023-10-13 SURGERY — RIGHT HEART CATH

## 2023-10-13 MED ORDER — ACETAMINOPHEN 325 MG PO TABS: 650.0000 mg | ORAL_TABLET | ORAL | Status: DC | PRN

## 2023-10-13 MED ORDER — HEPARIN (PORCINE) IN NACL 1000-0.9 UT/500ML-% IV SOLN
INTRAVENOUS | Status: DC | PRN
  Administered 2023-10-13: 500 mL

## 2023-10-13 MED ORDER — LABETALOL HCL 5 MG/ML IV SOLN: 10.0000 mg | INTRAVENOUS | Status: DC | PRN

## 2023-10-13 MED ORDER — HYDRALAZINE HCL 20 MG/ML IJ SOLN: 10.0000 mg | INTRAMUSCULAR | Status: DC | PRN

## 2023-10-13 MED ORDER — SODIUM CHLORIDE 0.9% FLUSH
3.0000 mL | Freq: Two times a day (BID) | INTRAVENOUS | Status: DC
Start: 2023-10-13 — End: 2023-10-13

## 2023-10-13 MED ORDER — SODIUM CHLORIDE 0.9% FLUSH: 3.0000 mL | INTRAVENOUS | Status: DC | PRN

## 2023-10-13 MED ORDER — SODIUM CHLORIDE 0.9 % IV SOLN
250.0000 mL | INTRAVENOUS | Status: DC | PRN
Start: 2023-10-13 — End: 2023-10-13

## 2023-10-13 MED ORDER — LIDOCAINE HCL (PF) 1 % IJ SOLN
INTRAMUSCULAR | Status: DC | PRN
  Administered 2023-10-13: 2 mL

## 2023-10-13 MED ORDER — LIDOCAINE HCL (PF) 1 % IJ SOLN
INTRAMUSCULAR | Status: AC
  Filled 2023-10-13: qty 30

## 2023-10-13 MED ORDER — ONDANSETRON HCL 4 MG/2ML IJ SOLN: 4.0000 mg | Freq: Four times a day (QID) | INTRAMUSCULAR | Status: DC | PRN

## 2023-10-13 SURGICAL SUPPLY — 5 items
CATH SWAN GANZ 7F STRAIGHT (CATHETERS) IMPLANT
GLIDESHEATH SLENDER 7FR .021G (SHEATH) IMPLANT
PACK CARDIAC CATHETERIZATION (CUSTOM PROCEDURE TRAY) IMPLANT
TRANSDUCER W/STOPCOCK (MISCELLANEOUS) IMPLANT
TUBING ART PRESS 72 MALE/FEM (TUBING) IMPLANT

## 2023-10-13 NOTE — Discharge Instructions (Signed)

## 2023-10-13 NOTE — Interval H&P Note (Signed)
History and Physical Interval Note:  10/13/2023 9:44 AM  Alan [name]  has presented today for surgery, with the diagnosis of Pulm HTN with ? CTEPH.  The various methods of treatment have been discussed with the patient and family. After consideration of risks, benefits and other options for treatment, the patient has consented to  Procedure(s): RIGHT HEART CATH (N/A) as a surgical intervention.  The patient's history has been reviewed, patient examined, no change in status, stable for surgery.  I have reviewed the patient's chart and labs.  Questions were answered to the patient's satisfaction.     Bryan Lemma

## 2023-10-27 ENCOUNTER — Ambulatory Visit: Payer: Medicaid Other | Admitting: Internal Medicine

## 2023-10-27 ENCOUNTER — Encounter: Payer: Self-pay | Admitting: Internal Medicine

## 2023-10-27 NOTE — Progress Notes (Deleted)
Alan [name], male    DOB: [birth year]    MRN: [id number]   Brief patient profile:  69  yobm  active smoker in [employer] x 4 y referred to pulmonary clinic in [place]  05/12/2023 by Ellamae Sia NP at [facility] for copd eval with onset of doe around 2019 but already slowed down by severe R knee problems    Had aecopd vs pna in may 2024 rx abx / prednisone and no worse since quit     History of Present Illness  05/12/2023  Pulmonary/ 1st office eval/ Sherene Sires / [place] [facility] dulera 100 Chief Complaint  Patient presents with   Consult  Dyspnea:  struggle to get to car - stops half a flight of steps  Walks to park maybe 50 ft and has stop  Cough:congested/rattlng/ min productive was green now mostly white  Sleep: in a recliner 45 degrees or couch with 2 pillows  SABA use: just dulera  02: none  Lung cancer screen: rec Rec Plan A = Automatic = Always=    Dulera 100 x 2 puffs first thing each am/ spiriva 2.5 x 2 puffs then 12 hours later just 2 more puffs of the dulera  Work on inhaler technique:  The key is to stop smoking completely before smoking completely stops you!  Please schedule a follow up visit in 3 months but call sooner if needed with PFTs    10/27/2023  f/u ov/[place] office/Keanu Lesniak re: *** maint on ***  No chief complaint on file.   Dyspnea:  *** Cough: *** Sleeping: ***   resp cc  SABA use: *** 02: ***  Lung cancer screening: ***   No obvious day to day or daytime variability or assoc excess/ purulent sputum or mucus plugs or hemoptysis or cp or chest tightness, subjective wheeze or overt sinus or hb symptoms.    Also denies any obvious fluctuation of symptoms with weather or environmental changes or other aggravating or alleviating factors except as outlined above   No unusual exposure hx or h/o childhood pna/ asthma or knowledge of premature birth.  Current Allergies, Complete Past Medical History, Past Surgical History, Family History, and Social  History were reviewed in [employer] record.  ROS  The following are not active complaints unless bolded Hoarseness, sore throat, dysphagia, dental problems, itching, sneezing,  nasal congestion or discharge of excess mucus or purulent secretions, ear ache,   fever, chills, sweats, unintended wt loss or wt gain, classically pleuritic or exertional cp,  orthopnea pnd or arm/hand swelling  or leg swelling, presyncope, palpitations, abdominal pain, anorexia, nausea, vomiting, diarrhea  or change in bowel habits or change in bladder habits, change in stools or change in urine, dysuria, hematuria,  rash, arthralgias, visual complaints, headache, numbness, weakness or ataxia or problems with walking or coordination,  change in mood or  memory.        No outpatient medications have been marked as taking for the 10/27/23 encounter (Appointment) with Nyoka Cowden, MD.            Objective:    Wt Readings from Last 3 Encounters:  10/13/23 125 lb (56.7 kg)  09/20/23 116 lb 9.6 oz (52.9 kg)  09/13/23 130 lb (59 kg)      Vital signs reviewed  10/27/2023  - Note at rest 02 sats  ***% on ***   General appearance:    ***    edentulous    Mild bar ***  .  I personally reviewed images and agree with radiology impression as follows:   Chest LDSCT        07/15/23 Rads 2/ mod emphysema     Assessment

## 2023-11-01 ENCOUNTER — Encounter: Payer: Self-pay | Admitting: *Deleted

## 2023-11-07 NOTE — Progress Notes (Unsigned)
Alan [name], male    DOB: 17-Jan-1966    MRN: [id number]   Brief patient profile:  70  yobm  active smoker in [employer] x 4 y referred to pulmonary clinic in [place]  05/12/2023 by Ellamae Sia NP at [facility] for copd eval with onset of doe around 2019 but already slowed down by severe R knee problems    Had aecopd vs pna in may 2024 rx abx / prednisone and no worse since quit  PFT's  09/28/23  FEV1 1.62 (45 % ) ratio 0.46  p 0 % improvement from saba p ?dulera/spiriva  prior to study with DLCO  12.87 (48%)   and FV curve classically concave       History of Present Illness  05/12/2023  Pulmonary/ 1st office eval/ Sherene Sires / [place] [facility] dulera 100 Chief Complaint  Patient presents with   Consult  Dyspnea:  struggle to get to car - stops half a flight of steps one flight  Walks to park maybe 50 ft and has stop  Cough:congested/rattlng/ min productive was green now mostly white  Sleep: in a recliner 45 degrees or couch with 2 pillows  SABA use: just dulera  02: none  Lung cancer screen: rec Rec Plan A = Automatic = Always=    Dulera 100 x 2 puffs first thing each am/ spiriva 2.5 x 2 puffs then 12 hours later just 2 more puffs of the dulera  Work on inhaler technique:  The key is to stop smoking completely before smoking completely stops you!    11/08/2023  f/u ov/ office/Mykelti Goldenstein re: GOLD 3 copd  maint on  dulera /spiriva Chief Complaint  Patient presents with   Follow-up  Dyspnea:  no change doe Cough: minimal white worse in am  Sleeping: 45 degrees recliner ok/worse cough when flatter SABA use: doesn't have one  02: none  Lung cancer screening: 07/15/23  rads 2 with emphysema / moderate    No obvious day to day or daytime variability or assoc excess/ purulent sputum or mucus plugs or hemoptysis or cp or chest tightness, subjective wheeze or overt sinus or hb symptoms.    Also denies any obvious fluctuation of symptoms with weather or environmental changes or  other aggravating or alleviating factors except as outlined above   No unusual exposure hx or h/o childhood pna/ asthma or knowledge of premature birth.  Current Allergies, Complete Past Medical History, Past Surgical History, Family History, and Social History were reviewed in [employer] record.  ROS  The following are not active complaints unless bolded Hoarseness, sore throat, dysphagia, dental problems, itching, sneezing,  nasal congestion or discharge of excess mucus or purulent secretions, ear ache,   fever, chills, sweats, unintended wt loss or wt gain, classically pleuritic or exertional cp,  orthopnea pnd or arm/hand swelling  or leg swelling, presyncope, palpitations, abdominal pain, anorexia, nausea, vomiting, diarrhea  or change in bowel habits or change in bladder habits, change in stools or change in urine, dysuria, hematuria,  rash, arthralgias, visual complaints, headache, numbness, weakness or ataxia or problems with walking or coordination,  change in mood or  memory.        Current Meds  Medication Sig   acetaminophen (TYLENOL) 500 MG tablet Take 250-500 mg by mouth every 6 (six) hours as needed for moderate pain or headache.   apixaban (ELIQUIS) 5 MG TABS tablet Take 2 tablets (10 mg total) by mouth 2 (two) times daily for 7 days,  THEN 1 tablet (5 mg total) 2 (two) times daily. (Patient taking differently: 1 tablet (5 mg total) 2 (two) times daily.)   aspirin-acetaminophen-caffeine (EXCEDRIN MIGRAINE) 250-250-65 MG tablet Take 1 tablet by mouth every 6 (six) hours as needed for headache.   famotidine-calcium carbonate-magnesium hydroxide (PEPCID COMPLETE) 10-800-165 MG chewable tablet Chew 1 tablet by mouth daily as needed (acid reflux).   Menthol, Topical Analgesic, (BIOFREEZE EX) Apply 1 application topically daily as needed (pain).   mometasone-formoterol (DULERA) 100-5 MCG/ACT AERO INHALE 2 PUFFS INTO THE LUNGS TWICE DAILY   Tetrahydrozoline HCl  (VISINE OP) Place 1 drop into both eyes daily as needed (dry eyes).   Tiotropium Bromide Monohydrate (SPIRIVA RESPIMAT) 2.5 MCG/ACT AERS 2 puffs each am (Patient taking differently: Inhale 1 puff into the lungs in the morning and at bedtime.)   torsemide (DEMADEX) 20 MG tablet Take 10 mg by mouth every other day.            Objective:    Wts   11/08/2023     117   10/13/23 125 lb (56.7 kg)  09/20/23 116 lb 9.6 oz (52.9 kg)  09/13/23 130 lb (59 kg)      Vital signs reviewed  11/08/2023  - Note at rest 02 sats  90% on RA   General appearance:    chronically ill > stated aged amb wm nad        HEENT : Oropharynx  clear/ edentulous       NECK :  without  apparent JVD/ palpable Nodes/TM    LUNGS: no acc muscle use,  Mild barrel  contour chest wall with bilateral  Distant bs s audible wheeze and  without cough on insp or exp maneuvers  and mild  Hyperresonant  to  percussion bilaterally     CV:  RRR  no s3 or murmur or increase in P2, and no edema   ABD:  soft and nontender with pos end  insp Hoover's  in the supine position.  No bruits or organomegaly appreciated   MS:  Nl gait/ ext warm without deformities Or obvious joint restrictions  calf tenderness, cyanosis or clubbing     SKIN: warm and dry without lesions    NEURO:  alert, approp, nl sensorium with  no motor or cerebellar deficits apparent.    .         I personally reviewed images and agree with radiology impression as follows:   Chest LDSCT        07/15/23 Rads 2/ mod emphysema     Assessment

## 2023-11-08 ENCOUNTER — Ambulatory Visit: Payer: Medicaid Other | Admitting: Internal Medicine

## 2023-11-08 ENCOUNTER — Encounter: Payer: Self-pay | Admitting: Internal Medicine

## 2023-11-08 VITALS — BP 102/68 | HR 113 | Ht 68.0 in | Wt 117.0 lb

## 2023-11-08 DIAGNOSIS — J449 Chronic obstructive pulmonary disease, unspecified: Secondary | ICD-10-CM

## 2023-11-08 DIAGNOSIS — F17210 Nicotine dependence, cigarettes, uncomplicated: Secondary | ICD-10-CM

## 2023-11-08 MED ORDER — ALBUTEROL SULFATE HFA 108 (90 BASE) MCG/ACT IN AERS: INHALATION_SPRAY | RESPIRATORY_TRACT | 11 refills | Status: DC

## 2023-11-08 NOTE — Assessment & Plan Note (Signed)
.  4-5 min discussion re active cigarette smoking in addition to office E&M  Ask about tobacco use:   ongoing Advise quitting   I took an extended  opportunity with this patient to outline the consequences of continued cigarette use  in airway disorders based on all the data we have from the multiple national lung health studies (perfomed over decades at millions of dollars in cost)  indicating that smoking cessation, not choice of inhalers or pulmonary physicians, is the most important aspect of his care and that medications are designed to improve symptoms, no "cure emphysema" or even halt its progression Assess willingness:  Not committed at this point Assist in quit attempt:  Per PCP when ready Arrange follow up:   Follow up per Primary Care planned  For smoking cessation classes call [phone]

## 2023-11-08 NOTE — Assessment & Plan Note (Signed)
Active smoker - 05/12/2023   Walked on RA  x  3  lap(s) =  approx 450  ft  @ mod pace, stopped due to end of study with lowest 02 sats 94% s sob/chest discomfort  - 05/12/2023  After extensive coaching inhaler device,  effectiveness =    75% with smi try adding spiriva 2.5 to dulera 100  -  Chest LDSCT   07/15/23  mod emphysema  PFT's  09/28/23  FEV1 1.62 (45 % ) ratio 0.46  p 0 % improvement from saba p ?dulera/spiriva  prior to study with DLCO  12.87 (48%)   and FV curve classically concave     Group D (now reclassified as E) in terms of symptom/risk and laba/lama/ICS  therefore appropriate rx at this point >>>  dulera/ spiriva and approp saba:  Re SABA :  I spent extra time with pt today reviewing appropriate use of albuterol for prn use on exertion with the following points: 1) saba is for relief of sob that does not improve by walking a slower pace or resting but rather if the pt does not improve after trying this first. 2) If the pt is convinced, as many are, that saba helps recover from activity faster then it's easy to tell if this is the case by re-challenging : ie stop, take the inhaler, then p 5 minutes try the exact same activity (intensity of workload) that just caused the symptoms and see if they are substantially diminished or not after saba 3) if there is an activity that reproducibly causes the symptoms, try the saba 15 min before the activity on alternate days   If in fact the saba really does help, then fine to continue to use it prn but advised may need to look closer at the maintenance regimen being used to achieve better control of airways disease with exertion.

## 2023-11-08 NOTE — Patient Instructions (Addendum)
The key is to stop smoking completely before smoking completely stops you!   Plan A = Automatic = Always=    dulera 100 mg 2 each and spiriva x 2 pffs, then 12hours later repeat just the dulera   Plan B = Backup (to supplement plan A, not to replace it) Only use your albuterol inhaler as a rescue medication to be used if you can't catch your breath by resting or doing a relaxed purse lip breathing pattern.  - The less you use it, the better it will work when you need it. - Ok to use the inhaler up to 2 puffs  every 4 hours if you must but call for appointment if use goes up over your usual need - Don't leave home without it !!  (think of it like starter fluid)   Also  Ok to try albuterol 15 min before an activity (on alternating days)  that you know would usually make you short of breath and see if it makes any difference and if makes none then don't take albuterol after activity unless you can't catch your breath as this means it's the resting that helps, not the albuterol.   Please schedule a follow up visit in 6  months but call sooner if needed

## 2023-11-15 ENCOUNTER — Ambulatory Visit: Payer: Medicaid Other | Admitting: Nurse Practitioner

## 2023-12-27 ENCOUNTER — Encounter: Payer: Self-pay | Admitting: Internal Medicine

## 2023-12-27 ENCOUNTER — Ambulatory Visit: Payer: Medicaid Other | Attending: Internal Medicine | Admitting: Internal Medicine

## 2023-12-27 VITALS — BP 117/60 | HR 91 | Ht 68.5 in | Wt 117.2 lb

## 2023-12-27 DIAGNOSIS — I2724 Chronic thromboembolic pulmonary hypertension: Secondary | ICD-10-CM | POA: Diagnosis not present

## 2023-12-27 DIAGNOSIS — I371 Nonrheumatic pulmonary valve insufficiency: Secondary | ICD-10-CM | POA: Diagnosis not present

## 2023-12-27 DIAGNOSIS — I517 Cardiomegaly: Secondary | ICD-10-CM

## 2023-12-27 DIAGNOSIS — J449 Chronic obstructive pulmonary disease, unspecified: Secondary | ICD-10-CM | POA: Diagnosis not present

## 2023-12-27 DIAGNOSIS — R000 Tachycardia, unspecified: Secondary | ICD-10-CM

## 2023-12-27 NOTE — Progress Notes (Signed)
 Cardiology Office Note  Date: 12/27/2023   ID: AERIK [name], DOB 03-23-1966, MRN [id number]  PCP:  Arrie Senate, FNP  Cardiologist:  Marjo Bicker, MD Electrophysiologist:  None    History of Present Illness: Alan [name] is a 58 y.o. 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  Doing great, DOE significantly improved after cutting back on smoking from 1 pack/day to 0.5 pack/day.  No other symptoms of chest pain, dizziness, presyncope, syncope, palpitations or leg swelling.  He does have knee arthritis which is bothering him.  Walks with a cane.  He is currently not on home oxygen for severe COPD.   Past Medical History:  Diagnosis Date   Right knee pain    Right shoulder pain     Past Surgical History:  Procedure Laterality Date   RIGHT HEART CATH N/A 10/13/2023   Procedure: RIGHT HEART CATH;  Surgeon: Marykay Lex, MD;  Location: [facility] INVASIVE CV LAB;  Service: Cardiovascular;  Laterality: N/A;   TONSILLECTOMY  58 yo    Current Outpatient Medications  Medication Sig Dispense Refill   acetaminophen (TYLENOL) 500 MG tablet Take 250-500 mg by mouth every 6 (six) hours as needed for moderate pain or headache.     albuterol (PROAIR HFA) 108 (90 Base) MCG/ACT inhaler 2 puffs every 4 hours as needed only  if your can't catch your breath 18 g 11   apixaban (ELIQUIS) 5 MG TABS tablet Take 2 tablets (10 mg total) by mouth 2 (two) times daily for 7 days, THEN 1 tablet (5 mg total) 2 (two) times daily. (Patient taking differently: 1 tablet (5 mg total) 2 (two) times daily.) 60 tablet 11   aspirin-acetaminophen-caffeine (EXCEDRIN MIGRAINE) 250-250-65 MG tablet Take 1 tablet by mouth every 6 (six) hours as needed for headache.     famotidine-calcium carbonate-magnesium hydroxide (PEPCID COMPLETE) 10-800-165 MG chewable tablet Chew 1 tablet  by mouth daily as needed (acid reflux).     Menthol, Topical Analgesic, (BIOFREEZE EX) Apply 1 application topically daily as needed (pain).     mometasone-formoterol (DULERA) 100-5 MCG/ACT AERO INHALE 2 PUFFS INTO THE LUNGS TWICE DAILY 13 g 0   Tetrahydrozoline HCl (VISINE OP) Place 1 drop into both eyes daily as needed (dry eyes).     Tiotropium Bromide Monohydrate (SPIRIVA RESPIMAT) 2.5 MCG/ACT AERS 2 puffs each am (Patient taking differently: Inhale 1 puff into the lungs in the morning and at bedtime.) 4 g 11   torsemide (DEMADEX) 20 MG tablet Take 10 mg by mouth every other day.     No current facility-administered medications for this visit.   Allergies:  Patient has no known allergies.   Social History: The patient  reports that he has been smoking cigarettes. He started smoking about 42 years ago. He has a 63.8 pack-year smoking history. He has never used smokeless tobacco. He reports current alcohol use. He reports current drug use. Drug: Marijuana.   Family History: The patient's family history includes Asthma in his brother; Diabetes in his father and mother; Heart disease in his mother.   ROS:  Please see the history of present illness. Otherwise, complete review of systems is positive for none  All other systems are reviewed and negative.   Physical Exam: VS:  There were no vitals taken for this visit., BMI There is no height or weight on file  to calculate BMI.  Wt Readings from Last 3 Encounters:  11/08/23 117 lb (53.1 kg)  10/13/23 125 lb (56.7 kg)  09/20/23 116 lb 9.6 oz (52.9 kg)    General: Patient appears comfortable at rest. HEENT: Conjunctiva and lids normal, oropharynx clear with moist mucosa. Neck: Supple, no elevated JVP or carotid bruits, no thyromegaly. Lungs: Clear to auscultation, nonlabored breathing at rest. Cardiac: Regular rate and rhythm, no S3 or significant systolic murmur, no pericardial rub. Abdomen: Soft, nontender, no hepatomegaly, bowel sounds  present, no guarding or rebound. Extremities: No pitting edema, distal pulses 2+. Skin: Warm and dry. Musculoskeletal: No kyphosis. Neuropsychiatric: Alert and oriented x3, affect grossly appropriate.  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 CT angio chest was negative for PE, VQ scan showed multiple bilateral wedge-shaped perfusion defects consistent with PE.  Ultrasound venous LE showed no evidence of DVT. Eventually RHC was performed on 10/2023 that showed moderate pulmonary hypertension.  DOE improved after cutting back on smoking.  He also has severe COPD, not on home O2.  Continue Eliquis 5 mg twice daily for the management of CTEPH.  Will obtain limited echocardiogram for evaluation of RV function and dilatation.  Will place referral to advanced heart failure for management of CTEPH (unclear if he needs pulmonary angiogram versus medical therapy/Riociguat or stay on the current therapy).  Will continue torsemide 10 mg every other day. -Cut back on smoking from 1 pack/day to half a pack per day.   Congratulated and encouraged to quit smoking altogether.  He verbalized understanding. -Follows with pulmonology for COPD management.   Disposition:  Follow up  6 months  Signed Morine Kohlman Verne Spurr, MD, 12/27/2023 8:56 AM    [facility] [address] [place], [place], [place] [address]

## 2023-12-27 NOTE — Patient Instructions (Addendum)
 Medication Instructions:  Your physician recommends that you continue on your current medications as directed. Please refer to the Current Medication list given to you today.   Labwork: None  Testing/Procedures: Your physician has requested that you have an echocardiogram. Echocardiography is a painless test that uses sound waves to create images of your heart. It provides your doctor with information about the size and shape of your heart and how well your heart's chambers and valves are working. This procedure takes approximately one hour. There are no restrictions for this procedure. Please do NOT wear cologne, perfume, aftershave, or lotions (deodorant is allowed). Please arrive 15 minutes prior to your appointment time.  Please note: We ask at that you not bring children with you during ultrasound (echo/ vascular) testing. Due to room size and safety concerns, children are not allowed in the ultrasound rooms during exams. Our front office staff cannot provide observation of children in our lobby area while testing is being conducted. An adult accompanying a patient to their appointment will only be allowed in the ultrasound room at the discretion of the ultrasound technician under special circumstances. We apologize for any inconvenience.   Follow-Up: Your physician recommends that you schedule a follow-up appointment in: 6 months  Any Other Special Instructions Will Be Listed Below (If Applicable). Referral to Advanced Heart Failure Clinic  Thank you for choosing [facility]!      If you need a refill on your cardiac medications before your next appointment, please call your pharmacy.

## 2023-12-30 ENCOUNTER — Ambulatory Visit: Payer: Medicaid Other | Admitting: Orthopedic Surgery

## 2024-01-12 ENCOUNTER — Ambulatory Visit: Payer: Medicaid Other | Attending: Internal Medicine

## 2024-01-12 DIAGNOSIS — I371 Nonrheumatic pulmonary valve insufficiency: Secondary | ICD-10-CM

## 2024-01-12 DIAGNOSIS — I517 Cardiomegaly: Secondary | ICD-10-CM | POA: Diagnosis not present

## 2024-01-12 LAB — ECHOCARDIOGRAM LIMITED
Area-P 1/2: 3.24 cm2
Calc EF: 57.2 %
S' Lateral: 3 cm
Single Plane A2C EF: 51.8 %
Single Plane A4C EF: 62.7 %

## 2024-01-17 ENCOUNTER — Other Ambulatory Visit: Payer: Self-pay | Admitting: *Deleted

## 2024-01-17 DIAGNOSIS — Z720 Tobacco use: Secondary | ICD-10-CM

## 2024-01-17 DIAGNOSIS — R053 Chronic cough: Secondary | ICD-10-CM

## 2024-01-17 MED ORDER — DULERA 100-5 MCG/ACT IN AERO: 2.0000 | INHALATION_SPRAY | Freq: Two times a day (BID) | RESPIRATORY_TRACT | 0 refills | Status: DC

## 2024-01-21 ENCOUNTER — Telehealth: Payer: Self-pay

## 2024-01-21 NOTE — Telephone Encounter (Signed)
 The patient has been notified of the result and verbalized understanding.  All questions (if any) were answered. Also advised patient that someone should reach out to him from [facility] clinic to schedule an appointment, but if not to call us back.  Carmelina Paddock, [place] 01/21/2024 4:51 PM

## 2024-01-21 NOTE — Telephone Encounter (Signed)
-----   Message from Vishnu P Mallipeddi sent at 01/13/2024  3:33 PM EST ----- LV function is normal but RV function is moderately reduced (improved compared to prior study).  RA and RV continue to be moderately dilated.  Pulmonary hypertension is severe now.  He will need to be followed by advanced heart failure for the management of severe pulmonary hypertension likely from CTEPH.  We placed a referral in February to [facility], he is not scheduled with anyone yet.  Can you follow-up on this referral please.  Otherwise, no change in the medications, continue current medications.

## 2024-02-15 ENCOUNTER — Encounter (HOSPITAL_COMMUNITY): Admitting: Cardiology

## 2024-03-07 ENCOUNTER — Telehealth: Payer: Self-pay

## 2024-03-07 NOTE — Telephone Encounter (Signed)
 Copied from CRM [id number]. Topic: Clinical - Medication Question >> Mar 07, 2024 12:06 PM Margarette Shawl wrote: Reason for CRM:   Pt is calling in regards to his Breztri . He is requiring refills, noting the first inhaler was a sample. The medication is not currently on med list and was discontinued last year in June. Advised it was prescribed by his PCP. Requested call back.  CB# [phone]  Per chart Breztri  was discontinued due to not being covered by [employer].  Spoke with patient and he confirms he is still on [employer]. Advised him Breztri  is not covered and he will need to stay on Dulera /Spiriva  regimen. Reviewed regimen with patient. He voiced understanding. Nothing further needed at this time.

## 2024-03-22 ENCOUNTER — Encounter (HOSPITAL_COMMUNITY): Payer: Self-pay | Admitting: Cardiology

## 2024-03-22 ENCOUNTER — Ambulatory Visit (HOSPITAL_COMMUNITY)
Admission: RE | Admit: 2024-03-22 | Discharge: 2024-03-22 | Disposition: A | Discharge status: Home / Self Care | Source: Ambulatory Visit | Attending: Cardiology | Admitting: Cardiology

## 2024-03-22 ENCOUNTER — Other Ambulatory Visit (HOSPITAL_COMMUNITY): Payer: Self-pay

## 2024-03-22 VITALS — HR 86 | Ht 68.5 in | Wt 114.4 lb

## 2024-03-22 DIAGNOSIS — I2724 Chronic thromboembolic pulmonary hypertension: Secondary | ICD-10-CM | POA: Diagnosis not present

## 2024-03-22 DIAGNOSIS — I272 Pulmonary hypertension, unspecified: Secondary | ICD-10-CM

## 2024-03-22 DIAGNOSIS — I5032 Chronic diastolic (congestive) heart failure: Secondary | ICD-10-CM | POA: Insufficient documentation

## 2024-03-22 LAB — BASIC METABOLIC PANEL WITH GFR
Anion gap: 7 (ref 5–15)
BUN: 6 mg/dL (ref 6–20)
CO2: 29 mmol/L (ref 22–32)
Calcium: 9 mg/dL (ref 8.9–10.3)
Chloride: 105 mmol/L (ref 98–111)
Creatinine, Ser: 0.76 mg/dL (ref 0.61–1.24)
GFR, Estimated: >60 mL/min (ref >60)
Glucose, Bld: 139 mg/dL — ABNORMAL HIGH (ref 70–99)
Potassium: 4.9 mmol/L (ref 3.5–5.1)
Sodium: 141 mmol/L (ref 135–145)

## 2024-03-22 LAB — BRAIN NATRIURETIC PEPTIDE: B Natriuretic Peptide: 53.8 pg/mL (ref 0.0–100.0)

## 2024-03-22 NOTE — Progress Notes (Signed)
Height:     Weight: BMI:  Today's Date:  STOP BANG RISK ASSESSMENT S (snore) Have you been told that you snore?     YES   T (tired) Are you often tired, fatigued, or sleepy during the day?   YES  O (obstruction) Do you stop breathing, choke, or gasp during sleep? NO   P (pressure) Do you have or are you being treated for high blood pressure? NO   B (BMI) Is your body index greater than 35 kg/m? NO   A (age) Are you 58 years old or older? YES   N (neck) Do you have a neck circumference greater than 16 inches?   NO   G (gender) Are you a male? YES   TOTAL STOP/BANG "YES" ANSWERS 4                                                                       For Office Use Only              Procedure Order Form    YES to 3+ Stop Bang questions OR two clinical symptoms - patient qualifies for WatchPAT (CPT 95800)      Clinical Notes: Will consult Sleep Specialist and refer for management of therapy due to patient increased risk of Sleep Apnea. Ordering a sleep study due to the following two clinical symptoms: Excessive daytime sleepiness G47.10 /  Morning Headaches G44.221 / Difficulty concentrating R41.840 / Memory problems or poor judgment G31.84 / Personality changes or irritability R45.4 / Loud snoring R06.83 / History of high blood pressure R03.0 / Insomnia G47.00

## 2024-03-22 NOTE — Patient Instructions (Signed)
 There has been no changes to your medications.  Labs done today, your results will be available in MyChart, we will contact you for abnormal readings.  Your provider has recommended that you have a home sleep study (Itamar Test).  We have provided you with the equipment in our office today. Please go ahead and download the app. DO NOT OPEN OR TAMPER WITH THE BOX UNTIL WE ADVISE YOU TO DO SO. Once insurance has approved the test our office will call you with PIN number and approval to proceed with testing. Once you have completed the test you just dispose of the equipment, the information is automatically uploaded to us  via blue-tooth technology. If your test is positive for sleep apnea and you need a home CPAP machine you will be contacted by Dr Charl Concha office [facility]) to set this up.  You are scheduled for a Cardiac Catheterization on Thursday, May 29 with Dr. Alease Amend.  1. Please arrive at the [facility] (Main Entrance A) at [facility]: [address] [place], [place] [address] at 5:30 AM (This time is 2 hour(s) before your procedure to ensure your preparation).   Free valet parking service is available. You will check in at ADMITTING. The support person will be asked to wait in the waiting room.  It is OK to have someone drop you off and come back when you are ready to be discharged.    Special note: Every effort is made to have your procedure done on time. Please understand that emergencies sometimes delay scheduled procedures.  2. Diet: Do not eat solid foods after midnight.  The patient may have clear liquids until 5am upon the day of the procedure.  3. Medication instructions in preparation for your procedure:   Contrast Allergy: No  HOLD TORSEMIDE  THE MORNING OF YOUR PROCEDURE.   On the morning of your procedure, take any morning medicines NOT listed above.  You may use sips of water.  5. Plan to go home the same day, you will only stay overnight if medically  necessary. 6. Bring a current list of your medications and current insurance cards. 7. You MUST have a responsible person to drive you home. 8. Someone MUST be with you the first 24 hours after you arrive home or your discharge will be delayed. 9. Please wear clothes that are easy to get on and off and wear slip-on shoes.  Your physician recommends that you schedule a follow-up appointment in: 1 month.  If you have any questions or concerns before your next appointment please send us  a message through [place] or call our office at [phone].    TO LEAVE A MESSAGE FOR THE NURSE SELECT OPTION 2, PLEASE LEAVE A MESSAGE INCLUDING: YOUR NAME DATE OF BIRTH CALL BACK NUMBER REASON FOR CALL**this is important as we prioritize the call backs  YOU WILL RECEIVE A CALL BACK THE SAME DAY AS LONG AS YOU CALL BEFORE 4:00 PM  At the [facility], you and your health needs are our priority. As part of our continuing mission to provide you with exceptional heart care, we have created designated Provider Care Teams. These Care Teams include your primary Cardiologist (physician) and Advanced Practice Providers (APPs- Physician Assistants and Nurse Practitioners) who all work together to provide you with the care you need, when you need it.   You may see any of the following providers on your designated Care Team at your next follow up: Dr Jules Oar Dr Alen Husbands  [place] Dr. Alwin Baars Dr. Arta Lark Amy Marijane Shoulders, NP Ruddy Corral, [place] [facility] [place], [place] Dennise Fitz, NP [place] Lee, NP Shawnee Dellen, NP Luster Salters, PharmD Bevely Brush, PharmD   Please be sure to bring in all your medications bottles to every appointment.    Thank you for choosing [facility]     If you have any questions or concerns before your next appointment please send us  a message through [place] or call our office at [phone].     TO LEAVE A MESSAGE FOR THE NURSE SELECT OPTION 2, PLEASE LEAVE A MESSAGE INCLUDING: YOUR NAME DATE OF BIRTH CALL BACK NUMBER REASON FOR CALL**this is important as we prioritize the call backs  YOU WILL RECEIVE A CALL BACK THE SAME DAY AS LONG AS YOU CALL BEFORE 4:00 PM  At the [facility], you and your health needs are our priority. As part of our continuing mission to provide you with exceptional heart care, we have created designated Provider Care Teams. These Care Teams include your primary Cardiologist (physician) and Advanced Practice Providers (APPs- Physician Assistants and Nurse Practitioners) who all work together to provide you with the care you need, when you need it.   You may see any of the following providers on your designated Care Team at your next follow up: Dr Jules Oar Dr Peder Bourdon Dr. Alwin Baars Dr. Arta Lark Amy Marijane Shoulders, NP Ruddy Corral, [place] [facility] [place], [place] Dennise Fitz, NP [place] Lee, NP Shawnee Dellen, NP Luster Salters, PharmD Bevely Brush, PharmD   Please be sure to bring in all your medications bottles to every appointment.    Thank you for choosing [facility]

## 2024-03-22 NOTE — H&P (View-Only) (Signed)
Height:     Weight: BMI:  Today's Date:  STOP BANG RISK ASSESSMENT S (snore) Have you been told that you snore?     YES   T (tired) Are you often tired, fatigued, or sleepy during the day?   YES  O (obstruction) Do you stop breathing, choke, or gasp during sleep? NO   P (pressure) Do you have or are you being treated for high blood pressure? NO   B (BMI) Is your body index greater than 35 kg/m? NO   A (age) Are you 58 years old or older? YES   N (neck) Do you have a neck circumference greater than 16 inches?   NO   G (gender) Are you a male? YES   TOTAL STOP/BANG "YES" ANSWERS 4                                                                       For Office Use Only              Procedure Order Form    YES to 3+ Stop Bang questions OR two clinical symptoms - patient qualifies for WatchPAT (CPT 95800)      Clinical Notes: Will consult Sleep Specialist and refer for management of therapy due to patient increased risk of Sleep Apnea. Ordering a sleep study due to the following two clinical symptoms: Excessive daytime sleepiness G47.10 /  Morning Headaches G44.221 / Difficulty concentrating R41.840 / Memory problems or poor judgment G31.84 / Personality changes or irritability R45.4 / Loud snoring R06.83 / History of high blood pressure R03.0 / Insomnia G47.00

## 2024-03-23 ENCOUNTER — Ambulatory Visit: Payer: Self-pay | Admitting: Cardiology

## 2024-03-23 LAB — ANA W/REFLEX: Anti Nuclear Antibody (ANA): NEGATIVE

## 2024-03-23 LAB — RHEUMATOID FACTOR: Rheumatoid fact SerPl-aCnc: <10 [IU]/mL

## 2024-03-23 LAB — ANTI-SCLERODERMA ANTIBODY: Scleroderma (Scl-70) (ENA) Antibody, IgG: <0.2 AI (ref 0.0–0.9)

## 2024-03-31 ENCOUNTER — Ambulatory Visit: Admitting: Family Medicine

## 2024-04-06 ENCOUNTER — Encounter: Payer: Self-pay | Admitting: Family Medicine

## 2024-04-06 ENCOUNTER — Ambulatory Visit (HOSPITAL_COMMUNITY)
Admission: RE | Admit: 2024-04-06 | Discharge: 2024-04-06 | Disposition: A | Discharge status: Home / Self Care | Attending: Cardiology | Admitting: Cardiology

## 2024-04-06 ENCOUNTER — Other Ambulatory Visit: Payer: Self-pay

## 2024-04-06 ENCOUNTER — Encounter (HOSPITAL_COMMUNITY)
Admission: RE | Disposition: A | Discharge status: Home / Self Care | Payer: Self-pay | Source: Home / Self Care | Attending: Cardiology

## 2024-04-06 DIAGNOSIS — I272 Pulmonary hypertension, unspecified: Secondary | ICD-10-CM | POA: Diagnosis not present

## 2024-04-06 HISTORY — PX: RIGHT HEART CATH: CATH118263

## 2024-04-06 HISTORY — PX: PULMONARY ANGIOGRAPHY: CATH118325

## 2024-04-06 LAB — POCT I-STAT EG7
Acid-Base Excess: 1 mmol/L (ref 0.0–2.0)
Acid-Base Excess: 2 mmol/L (ref 0.0–2.0)
Bicarbonate: 27.5 mmol/L (ref 20.0–28.0)
Bicarbonate: 27.8 mmol/L (ref 20.0–28.0)
Calcium, Ion: 1.25 mmol/L (ref 1.15–1.40)
Calcium, Ion: 1.26 mmol/L (ref 1.15–1.40)
HCT: 33 % — ABNORMAL LOW (ref 39.0–52.0)
HCT: 33 % — ABNORMAL LOW (ref 39.0–52.0)
Hemoglobin: 11.2 g/dL — ABNORMAL LOW (ref 13.0–17.0)
Hemoglobin: 11.2 g/dL — ABNORMAL LOW (ref 13.0–17.0)
O2 Saturation: 64 %
O2 Saturation: 67 %
Potassium: 3.8 mmol/L (ref 3.5–5.1)
Potassium: 3.9 mmol/L (ref 3.5–5.1)
Sodium: 140 mmol/L (ref 135–145)
Sodium: 140 mmol/L (ref 135–145)
TCO2: 29 mmol/L (ref 22–32)
TCO2: 29 mmol/L (ref 22–32)
pCO2, Ven: 49.1 mmHg (ref 44–60)
pCO2, Ven: 49.6 mmHg (ref 44–60)
pH, Ven: 7.353 (ref 7.25–7.43)
pH, Ven: 7.361 (ref 7.25–7.43)
pO2, Ven: 35 mmHg (ref 32–45)
pO2, Ven: 36 mmHg (ref 32–45)

## 2024-04-06 LAB — CBC
HCT: 35.2 % — ABNORMAL LOW (ref 39.0–52.0)
Hemoglobin: 10.9 g/dL — ABNORMAL LOW (ref 13.0–17.0)
MCH: 27.5 pg (ref 26.0–34.0)
MCHC: 31 g/dL (ref 30.0–36.0)
MCV: 88.7 fL (ref 80.0–100.0)
Platelets: 405 10*3/uL — ABNORMAL HIGH (ref 150–400)
RBC: 3.97 MIL/uL — ABNORMAL LOW (ref 4.22–5.81)
RDW: 16.6 % — ABNORMAL HIGH (ref 11.5–15.5)
WBC: 5.1 10*3/uL (ref 4.0–10.5)
nRBC: 0 % (ref 0.0–0.2)

## 2024-04-06 SURGERY — RIGHT HEART CATH
Anesthesia: LOCAL

## 2024-04-06 MED ORDER — SODIUM CHLORIDE 0.9 % IV SOLN: INTRAVENOUS | Status: DC

## 2024-04-06 MED ORDER — IOHEXOL 350 MG/ML SOLN
INTRAVENOUS | Status: DC | PRN
  Administered 2024-04-06: 125 mL

## 2024-04-06 MED ORDER — LIDOCAINE HCL (PF) 1 % IJ SOLN
INTRAMUSCULAR | Status: AC
  Filled 2024-04-06: qty 30

## 2024-04-06 MED ORDER — HEPARIN (PORCINE) IN NACL 1000-0.9 UT/500ML-% IV SOLN
INTRAVENOUS | Status: DC | PRN
  Administered 2024-04-06: 500 mL

## 2024-04-06 MED ORDER — LIDOCAINE HCL (PF) 1 % IJ SOLN
INTRAMUSCULAR | Status: DC | PRN
  Administered 2024-04-06: 5 mL via INTRADERMAL

## 2024-04-06 SURGICAL SUPPLY — 11 items
CATH INFINITI 6F ANG MULTIPACK (CATHETERS) IMPLANT
CATH SWAN GANZ 7F STRAIGHT (CATHETERS) IMPLANT
GLIDESHEATH SLENDER 7FR .021G (SHEATH) IMPLANT
KIT HEART LEFT (KITS) IMPLANT
KIT MICROPUNCTURE NIT STIFF (SHEATH) IMPLANT
PACK CARDIAC CATHETERIZATION (CUSTOM PROCEDURE TRAY) ×1 IMPLANT
SHEATH PINNACLE 7F 10CM (SHEATH) IMPLANT
SHEATH PROBE COVER 6X72 (BAG) IMPLANT
TRANSDUCER W/STOPCOCK (MISCELLANEOUS) IMPLANT
TUBING ART PRESS 72 MALE/FEM (TUBING) IMPLANT
WIRE EMERALD 3MM-J .035X150CM (WIRE) IMPLANT

## 2024-04-06 NOTE — Interval H&P Note (Signed)
 History and Physical Interval Note:  04/06/2024 9:45 AM  Alan [name]  has presented today for surgery, with the diagnosis of hp.  The various methods of treatment have been discussed with the patient and family. After consideration of risks, benefits and other options for treatment, the patient has consented to  Procedure(s): RIGHT HEART CATH (N/A) PULMONARY ANGIOGRAPHY (N/A) as a surgical intervention.  The patient's history has been reviewed, patient examined, no change in status, stable for surgery.  I have reviewed the patient's chart and labs.  Questions were answered to the patient's satisfaction.     Lauralee Poll

## 2024-04-06 NOTE — Discharge Instructions (Signed)
 Call Dr Leldon Push office if any problems,questions, or concerns; remove dressing tomorrow afternoon and may shower, check site everyday for signs of infection redness, drainage, fever, pain or swelling or bleeding

## 2024-04-07 ENCOUNTER — Encounter (HOSPITAL_COMMUNITY): Admitting: Internal Medicine

## 2024-04-11 ENCOUNTER — Encounter (HOSPITAL_COMMUNITY): Payer: Self-pay | Admitting: Cardiology

## 2024-04-11 ENCOUNTER — Telehealth (HOSPITAL_COMMUNITY): Payer: Self-pay | Admitting: Pharmacist

## 2024-04-11 ENCOUNTER — Other Ambulatory Visit (HOSPITAL_COMMUNITY): Payer: Self-pay

## 2024-04-11 ENCOUNTER — Telehealth (HOSPITAL_COMMUNITY): Payer: Self-pay

## 2024-04-11 NOTE — Telephone Encounter (Signed)
 Advanced Heart Failure Patient Advocate Encounter  New start forms for [employer] prepared for pt and prescriber to sign off. Patient will stop by [facility] tomorrow to sign, as he will be in the area for another appointment.  Kennis Peacock, CPhT Rx Patient Advocate Phone: [phone]

## 2024-04-11 NOTE — Telephone Encounter (Signed)
 Advanced Heart Failure Patient Advocate Encounter  Attempted prior authorization for [employer] through [employer]MM (key [id number]). 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  Luster Salters, PharmD, BCPS, BCCP, CPP Heart Failure Clinic Pharmacist [phone]

## 2024-04-12 ENCOUNTER — Encounter (HOSPITAL_COMMUNITY): Admitting: Cardiology

## 2024-04-13 ENCOUNTER — Other Ambulatory Visit (HOSPITAL_COMMUNITY): Payer: Self-pay

## 2024-04-14 ENCOUNTER — Other Ambulatory Visit (HOSPITAL_COMMUNITY): Payer: Self-pay

## 2024-04-17 NOTE — Telephone Encounter (Signed)
 Patient rescheduled appointment, was unable to sign forms in office. Patient consented to having advocate sign on behalf, and a blank copy of [employer] forms have been mailed to patient for review. Enrollment forms faxed to [employer] on 04/17/2024.

## 2024-04-24 ENCOUNTER — Other Ambulatory Visit (HOSPITAL_COMMUNITY): Payer: Self-pay

## 2024-04-27 ENCOUNTER — Other Ambulatory Visit (HOSPITAL_COMMUNITY): Payer: Self-pay

## 2024-05-07 NOTE — Progress Notes (Unsigned)
 Alan [name], male    DOB: 01-Sep-1966    MRN: [id number]   Brief patient profile:  30  yobm  active smoker in [employer] x 4 y referred to pulmonary clinic in [place]  05/12/2023 by Cathlene NP at [facility] for copd eval with onset of doe around 2019 but already slowed down by severe R knee problems    Had aecopd vs pna in may 2024 rx abx / prednisone  and no worse since quit  PFT's  09/28/23  FEV1 1.62 (45 % ) ratio 0.46  p 0 % improvement from saba p ?dulera /spiriva   prior to study with DLCO  12.87 (48%)   and FV curve classically concave       History of Present Illness  05/12/2023  Pulmonary/ 1st office eval/ Alan [name] / [place] [facility] dulera  100 Chief Complaint  Patient presents with   Consult  Dyspnea:  struggle to get to car - stops half a flight of steps one flight  Walks to park maybe 50 ft and has stop  Cough:congested/rattlng/ min productive was green now mostly white  Sleep: in a recliner 45 degrees or couch with 2 pillows  SABA use: just dulera   02: none  Lung cancer screen: rec Rec Plan A = Automatic = Always=    Dulera  100 x 2 puffs first thing each am/ spiriva  2.5 x 2 puffs then 12 hours later just 2 more puffs of the dulera   Work on inhaler technique:  The key is to stop smoking completely before smoking completely stops you!    11/08/2023  f/u ov/[place] [facility]/Alan [name] re: GOLD 3 copd  maint on  dulera  /spiriva  Chief Complaint  Patient presents with   Follow-up  Dyspnea:  no change doe Cough: minimal white worse in am  Sleeping: 45 degrees recliner ok/worse cough when flatter SABA use: doesn't have one  02: none Lung cancer screening: 07/15/23  rads 2 with emphysema / moderate  Rec The key is to stop smoking completely before smoking completely stops you! Plan A = Automatic = Always=    dulera  100 mg 2 each and spiriva  x 2 pffs, then 12hours later repeat just the dulera   Plan B = Backup (to supplement plan A, not to replace it) Only use your albuterol   inhaler as a rescue medication Also  Ok to try albuterol  15 min before an activity (on alternating days)  that you know would usually make you short of breath   05/09/2024  f/u ov/[place] office/Alan [name] re: GOLD 3 copd/ still smoking  maint on dulera  /spiriva   Chief Complaint  Patient presents with   Follow-up   COPD  Dyspnea:  a little easier across the parking lot/ stops one half way up each flight of steps Cough: smoker's rattle. Worse in am clears out x an hour Sleeping: 45 degrees helps       SABA use: 3-4 x per day, seems better if uses pre-exertion but still using too much post ex and not really triggering it correctly using hfa  02: none   Lung cancer screening: due 07/2024    No obvious day to day or daytime variability or assoc   purulent sputum or mucus plugs or hemoptysis or cp or chest tightness, subjective wheeze or overt  hb symptoms.    Also denies any obvious fluctuation of symptoms with weather or environmental changes or other aggravating or alleviating factors except as outlined above   No unusual exposure hx or h/o childhood pna/ asthma or knowledge  of premature birth.  Current Allergies, Complete Past Medical History, Past Surgical History, Family History, and Social History were reviewed in [employer] record.  ROS  The following are not active complaints unless bolded Hoarseness, sore throat, dysphagia, dental problems, itching, sneezing,  nasal congestion or discharge of excess mucus or purulent secretions, ear ache,   fever, chills, sweats, unintended wt loss or wt gain, classically pleuritic or exertional cp,  orthopnea pnd or arm/hand swelling  or leg swelling, presyncope, palpitations, abdominal pain, anorexia, nausea, vomiting, diarrhea  or change in bowel habits or change in bladder habits, change in stools or change in urine, dysuria, hematuria,  rash, arthralgias, visual complaints, headache, numbness, weakness or ataxia or problems with  walking or coordination,  change in mood or  memory.        Current Meds  Medication Sig   albuterol  (PROAIR  HFA) 108 (90 Base) MCG/ACT inhaler 2 puffs every 4 hours as needed only  if your can't catch your breath   apixaban  (ELIQUIS ) 5 MG TABS tablet Take 5 mg by mouth 2 (two) times daily.   Aspirin -Caffeine (BAYER BACK & BODY PO) Take 1 tablet by mouth daily as needed (pain).   aspirin -sod bicarb-citric acid (ALKA-SELTZER) 325 MG TBEF tablet Take 650 mg by mouth every 6 (six) hours as needed (indigestion).   Dihydroxyaluminum Sod Carb (ROLAIDS PO) Take 1 tablet by mouth daily as needed (acid reflux).   famotidine-calcium carbonate-magnesium  hydroxide (PEPCID COMPLETE) 10-800-165 MG chewable tablet Chew 1 tablet by mouth daily as needed (acid reflux).   mometasone -formoterol  (DULERA ) 100-5 MCG/ACT AERO Inhale 2 puffs into the lungs 2 (two) times daily.   Tiotropium Bromide Monohydrate  (SPIRIVA  RESPIMAT) 2.5 MCG/ACT AERS 2 puffs each am   torsemide  (DEMADEX ) 20 MG tablet Take 10 mg by mouth every other day.             Objective:    Wts  05/09/2024         102   11/08/2023     117   10/13/23 125 lb (56.7 kg)  09/20/23 116 lb 9.6 oz (52.9 kg)  09/13/23 130 lb (59 kg)    Vital signs reviewed  05/09/2024  - Note at rest 02 sats  97% on RA   General appearance:    amb bm nad      HEENT : Oropharynx  clear/ edentulous   Nasal turbinates nl    NECK :  without  apparent JVD/ palpable Nodes/TM    LUNGS: no acc muscle use,  Mild barrel  contour chest wall with bilateral  Distant bs s audible wheeze and  without cough on insp or exp maneuvers  and mild  Hyperresonant  to  percussion bilaterally     CV:  RRR  no s3 or murmur or increase in P2, and no edema   ABD:  soft and nontender with pos end  insp Hoover's  in the supine position.  No bruits or organomegaly appreciated   MS:  Nl gait/ ext warm without deformities Or obvious joint restrictions  calf tenderness, cyanosis or  clubbing     SKIN: warm and dry without lesions    NEURO:  alert, approp, nl sensorium with  no motor or cerebellar deficits apparent.    .     Assessment

## 2024-05-09 ENCOUNTER — Ambulatory Visit: Admitting: Internal Medicine

## 2024-05-09 ENCOUNTER — Encounter: Payer: Self-pay | Admitting: Internal Medicine

## 2024-05-09 VITALS — BP 129/84 | HR 102 | Ht 68.0 in | Wt 115.8 lb

## 2024-05-09 DIAGNOSIS — F17210 Nicotine dependence, cigarettes, uncomplicated: Secondary | ICD-10-CM

## 2024-05-09 DIAGNOSIS — J449 Chronic obstructive pulmonary disease, unspecified: Secondary | ICD-10-CM

## 2024-05-09 NOTE — Assessment & Plan Note (Signed)
 Active smoker - 05/12/2023   Walked on RA  x  3  lap(s) =  approx 450  ft  @ mod pace, stopped due to end of study with lowest 02 sats 94% s sob/chest discomfort  - 05/12/2023  ry adding spiriva  2.5 to dulera  100  -  Chest LDSCT   07/15/23  mod emphysema  PFT's  09/28/23  FEV1 1.62 (45 % ) ratio 0.46  p 0 % improvement from saba p ?dulera /spiriva   prior to study with DLCO  12.87 (48%)   and FV curve classically concave    05/09/2024  After extensive coaching inhaler device,  effectiveness =    75% with hfa/ 90% with smi from baselines well less than 50% due to short Ti    Group D (now reclassified as E) in terms of symptom/risk and laba/lama/ICS  therefore appropriate rx at this point >>>  dulera  100/ spiriva  2.5 and more approp saba:  Re SABA :  I spent extra time with pt today reviewing appropriate use of albuterol  for prn use on exertion with the following points: 1) saba is for relief of sob that does not improve by walking a slower pace or resting but rather if the pt does not improve after trying this first. 2) If the pt is convinced, as many are, that saba helps recover from activity faster then it's easy to tell if this is the case by re-challenging : ie stop, take the inhaler, then p 5 minutes try the exact same activity (intensity of workload) that just caused the symptoms and see if they are substantially diminished or not after saba 3) if there is an activity that reproducibly causes the symptoms, try the saba 15 min before the activity on alternate days   If in fact the saba really does help, then fine to continue to use it prn but advised may need to look closer at the maintenance regimen being used to achieve better control of airways disease with exertion.

## 2024-05-09 NOTE — Assessment & Plan Note (Signed)
 Active smoker - Referred for LDSCT 05/12/2023 > scheduled for q September   Counseled re importance of smoking cessation but did not meet time criteria for separate billing    F/u q 6 m, sooner prn   Each maintenance medication was reviewed in detail including emphasizing most importantly the difference between maintenance and prns and under what circumstances the prns are to be triggered using an action plan format where appropriate.  Total time for H and P, chart review, counseling, reviewing hfa/ smi/ device(s) and generating customized AVS unique to this office visit / same day charting = 30 min

## 2024-05-09 NOTE — Patient Instructions (Signed)
 Work on inhaler technique:  relax and gently blow all the way out then take a nice smooth full deep breath back in, triggering the inhaler at same time you start breathing in.  Hold breath in for at least  5 seconds if you can. Blow out dulera  thru nose. Rinse and gargle with water when done.  If mouth or throat bother you at all,  try brushing teeth/gums/tongue with arm and hammer toothpaste/ make a slurry and gargle and spit out.   >>>  Remember how golfers warm up by taking practice swings - do this with an empty inhaler   Please schedule a follow up visit in 6  months but call sooner if needed  with all medications /inhalers/ solutions in hand so we can verify exactly what you are taking. This includes all medications from all doctors and over the counters

## 2024-05-10 ENCOUNTER — Telehealth (HOSPITAL_COMMUNITY): Payer: Self-pay | Admitting: Cardiology

## 2024-05-10 MED ORDER — TIOTROPIUM BROMIDE MONOHYDRATE 18 MCG IN CAPS: 18.0000 ug | ORAL_CAPSULE | Freq: Every day | RESPIRATORY_TRACT | Status: DC

## 2024-05-10 NOTE — Addendum Note (Signed)
 Addended by: RUFFUS LUKES T on: 05/10/2024 08:34 AM   Modules accepted: Orders

## 2024-05-10 NOTE — Telephone Encounter (Signed)
 Called to confirm/remind patient of their appointment at the [facility] on 05/10/2024.   Appointment:   [] Confirmed  [x] Left mess   [] No answer/No voice mail  [] VM Full/unable to leave message  [] Phone not in service  Patient reminded to bring all medications and/or complete list.  Confirmed patient has transportation. Gave directions, instructed to utilize valet parking.

## 2024-05-11 ENCOUNTER — Encounter (HOSPITAL_COMMUNITY): Admitting: Cardiology

## 2024-05-24 ENCOUNTER — Telehealth (HOSPITAL_COMMUNITY): Payer: Self-pay

## 2024-05-24 ENCOUNTER — Other Ambulatory Visit (HOSPITAL_COMMUNITY): Payer: Self-pay

## 2024-05-24 MED ORDER — ADEMPAS 0.5 MG PO TABS: 0.5000 | ORAL_TABLET | Freq: Three times a day (TID) | ORAL | Status: DC

## 2024-05-24 NOTE — Telephone Encounter (Signed)
 Patient received and started Adempas  approximately 1 week ago. Received from [facility]. Will update medication list.

## 2024-05-24 NOTE — Telephone Encounter (Signed)
 Advanced Heart Failure Patient Advocate Encounter  Contacted Adempas  to ensure that new start forms were received. Representative confirmed forms were processed and triaged to [employer] on 6/10.  Contacted patient, pt states he has been on the medication for apx 1 week at this time and will have follow up appointments with nursing staff to monitor.  Rachel DEL, CPhT Rx Patient Advocate Phone: [phone]

## 2024-05-27 ENCOUNTER — Other Ambulatory Visit: Payer: Self-pay | Admitting: Internal Medicine

## 2024-06-06 ENCOUNTER — Telehealth (HOSPITAL_COMMUNITY): Payer: Self-pay | Admitting: Cardiology

## 2024-06-06 NOTE — Telephone Encounter (Signed)
 Called to confirm/remind patient of their appointment at the [facility] on 06/06/2024.   Appointment:   [] Confirmed  [x] Left mess   [] No answer/No voice mail  [] VM Full/unable to leave message  [] Phone not in service  Patient reminded to bring all medications and/or complete list.  Confirmed patient has transportation. Gave directions, instructed to utilize valet parking.

## 2024-06-07 ENCOUNTER — Encounter (HOSPITAL_COMMUNITY): Admitting: Cardiology

## 2024-06-14 ENCOUNTER — Telehealth (HOSPITAL_COMMUNITY): Payer: Self-pay | Admitting: Cardiology

## 2024-06-14 NOTE — Telephone Encounter (Signed)
 Called to confirm/remind patient of their appointment at the [facility] on 06/14/2024.   Appointment:   [] Confirmed  [x] Left mess   [] No answer/No voice mail  [] VM Full/unable to leave message  [] Phone not in service  Patient reminded to bring all medications and/or complete list.  Confirmed patient has transportation. Gave directions, instructed to utilize valet parking.

## 2024-06-15 ENCOUNTER — Ambulatory Visit (HOSPITAL_COMMUNITY)
Admission: RE | Admit: 2024-06-15 | Discharge: 2024-06-15 | Disposition: A | Discharge status: Home / Self Care | Source: Ambulatory Visit | Attending: Cardiology | Admitting: Cardiology

## 2024-06-15 ENCOUNTER — Encounter (HOSPITAL_COMMUNITY): Payer: Self-pay | Admitting: Cardiology

## 2024-06-15 VITALS — BP 90/58 | HR 101 | Wt 117.0 lb

## 2024-06-15 DIAGNOSIS — I272 Pulmonary hypertension, unspecified: Secondary | ICD-10-CM | POA: Diagnosis not present

## 2024-06-15 DIAGNOSIS — I5032 Chronic diastolic (congestive) heart failure: Secondary | ICD-10-CM | POA: Diagnosis present

## 2024-06-15 DIAGNOSIS — Z7901 Long term (current) use of anticoagulants: Secondary | ICD-10-CM | POA: Diagnosis not present

## 2024-06-15 DIAGNOSIS — J449 Chronic obstructive pulmonary disease, unspecified: Secondary | ICD-10-CM | POA: Diagnosis not present

## 2024-06-15 NOTE — Progress Notes (Signed)
 6 Min Walk Test Completed  Pt ambulated 924ft (289.13m) O2 Sat ranged 91-92 on 0 L oxygen HR ranged 127-142

## 2024-06-15 NOTE — Patient Instructions (Signed)
  Follow-Up in: 3 MONTHS WITH AN ECHO PLEASE CALL OUR OFFICE AROUND SEPTEMBER TO GET SCHEDULED FOR YOUR APPOINTMENT. PHONE NUMBER IS [phone] OPTION 2   At the [facility], you and your health needs are our priority. We have a designated team specialized in the treatment of Heart Failure. This Care Team includes your primary Heart Failure Specialized Cardiologist (physician), Advanced Practice Providers (APPs- Physician Assistants and Nurse Practitioners), and Pharmacist who all work together to provide you with the care you need, when you need it.   You may see any of the following providers on your designated Care Team at your next follow up:  Dr. Toribio Fuel Dr. Ezra Shuck Dr. Ria Commander Dr. Odis Brownie Greig Mosses, NP Caffie Shed, [place] [facility] [place], [place] Beckey Coe, NP [place] Lee, NP Tinnie Redman, PharmD   Please be sure to bring in all your medications bottles to every appointment.   Need to Contact Us :  If you have any questions or concerns before your next appointment please send us  a message through [place] or call our office at [phone].    TO LEAVE A MESSAGE FOR THE NURSE SELECT OPTION 2, PLEASE LEAVE A MESSAGE INCLUDING: YOUR NAME DATE OF BIRTH CALL BACK NUMBER REASON FOR CALL**this is important as we prioritize the call backs  YOU WILL RECEIVE A CALL BACK THE SAME DAY AS LONG AS YOU CALL BEFORE 4:00 PM

## 2024-06-15 NOTE — Progress Notes (Signed)
   ADVANCED HEART FAILURE FOLLOW UP CLINIC NOTE  Referring Physician: Cathlene Marry Lenis*  Primary Care: Cathlene Marry Lenis, FNP (Inactive) Primary Cardiologist:  HPI: Alan [name] is a 58 y.o. male who presents for follow up of pulmonary hypertension.      Patient was referred to pulmonary hypertension clinic for a past medical history of pulmonary hypertension diagnosed by echocardiogram on 05/2023.  VQ scan was abnormal on 08/2023 showing multiple bilateral wedge-shaped perfusion defects.  They did not prove also with inhaler therapy, likely has a past medical history of COPD.  He was referred to pulmonary hypertension clinic for further evaluation.  Underwent right heart catheterization 04/2024 which showed moderately elevated PA pressures, improved from echocardiogram, as well as mild to moderate disease in pulmonary angiogram.  Patient was started on Adempas  and continued on anticoagulation.     SUBJECTIVE:  Patient continues to feel better on current therapy.  His Adempas  was recently increased to 1 mg 3 times daily.  He reports shortness of breath with more than moderate exertion, but is able to do much more than he was previously.  He denies any swelling, syncope, lightheadedness, dizziness.  PMH, current medications, allergies, social history, and family history reviewed in epic.  PHYSICAL EXAM: Vitals:   06/15/24 1136  BP: (!) 90/58  Pulse: (!) 101  SpO2: 96%   GENERAL: Well nourished and in no apparent distress at rest.  PULM:  Normal work of breathing, clear to auscultation bilaterally. Respirations are unlabored.  CARDIAC:  JVP: Flat         Normal rate with regular rhythm. No murmurs, rubs or gallops.  Trace edema. Warm and well perfused extremities. ABDOMEN: Soft, non-tender, non-distended. NEUROLOGIC: Patient is oriented x3 with no focal or lateralizing neurologic deficits.    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 Abnormal V/Q scan and PA angiogram. Given his improvement in PA pressures on angiogram, mild obstructive disease is currently on medical therapy with riociguat  as well as anticoagulation. - Repeat echocardiogram at next visit, consider repeat right heart catheterization depending on results - 6-minute walk test today - Last BNP normal, will need updated for REVEAL LITE at next visit - Continue riociguat  1 mg 3 times daily - Continue inhaler therapy per pulmonary - Autoimmune workup negative  COPD: Managed by pulmonary, stable symptoms. - PFTs reviewed  PE: Noted on prior V/Q scan, currently on apixaban  5mg  BID - Lifelong therapy    Follow up in 3 months with echo  Morene Brownie, MD Advanced Heart Failure Mechanical Circulatory Support 06/15/24

## 2024-06-29 ENCOUNTER — Other Ambulatory Visit: Payer: Self-pay | Admitting: *Deleted

## 2024-06-29 ENCOUNTER — Encounter: Payer: Self-pay | Admitting: Family Medicine

## 2024-06-29 DIAGNOSIS — Z720 Tobacco use: Secondary | ICD-10-CM

## 2024-06-29 DIAGNOSIS — R053 Chronic cough: Secondary | ICD-10-CM

## 2024-06-29 NOTE — Telephone Encounter (Signed)
 Marry pt NTBS by new provider Last OV 06/02/23 NO RF sent to pharmacy last OV greater than a year

## 2024-06-29 NOTE — Telephone Encounter (Signed)
 Letter sent

## 2024-07-14 ENCOUNTER — Telehealth (HOSPITAL_COMMUNITY): Payer: Self-pay

## 2024-07-14 NOTE — Telephone Encounter (Signed)
 Advanced Heart Failure Patient Advocate Encounter  Nursing staff confirmed pt is doing well on Adempas  and will be increasing to 2mg  dosing.  Rachel DEL, CPhT Rx Patient Advocate Phone: [phone]

## 2024-07-20 ENCOUNTER — Telehealth: Payer: Self-pay

## 2024-07-20 NOTE — Telephone Encounter (Signed)
 LVM to call office and schedule annual Lung CT.

## 2024-08-25 ENCOUNTER — Other Ambulatory Visit (HOSPITAL_COMMUNITY): Payer: Self-pay | Admitting: Cardiology

## 2024-08-25 MED ORDER — ADEMPAS 1 MG PO TABS: 1.0000 mg | ORAL_TABLET | Freq: Three times a day (TID) | ORAL | 3 refills | Status: DC

## 2024-08-29 ENCOUNTER — Other Ambulatory Visit (HOSPITAL_COMMUNITY): Payer: Self-pay | Admitting: Cardiology

## 2024-09-21 ENCOUNTER — Other Ambulatory Visit: Payer: Self-pay

## 2024-09-21 ENCOUNTER — Other Ambulatory Visit (HOSPITAL_COMMUNITY): Payer: Self-pay | Admitting: Cardiology

## 2024-09-21 DIAGNOSIS — I2782 Chronic pulmonary embolism: Secondary | ICD-10-CM

## 2024-09-21 MED ORDER — ADEMPAS 2.5 MG PO TABS: 1.0000 | ORAL_TABLET | Freq: Three times a day (TID) | ORAL | 3 refills | Status: AC

## 2024-09-22 ENCOUNTER — Other Ambulatory Visit: Payer: Self-pay | Admitting: *Deleted

## 2024-09-22 DIAGNOSIS — Z720 Tobacco use: Secondary | ICD-10-CM

## 2024-09-22 DIAGNOSIS — R053 Chronic cough: Secondary | ICD-10-CM

## 2024-09-26 ENCOUNTER — Encounter (HOSPITAL_COMMUNITY): Admitting: Cardiology

## 2024-09-26 ENCOUNTER — Ambulatory Visit (HOSPITAL_COMMUNITY): Admission: RE | Admit: 2024-09-26 | Source: Ambulatory Visit

## 2024-09-26 MED ORDER — APIXABAN 5 MG PO TABS: 5.0000 mg | ORAL_TABLET | Freq: Two times a day (BID) | ORAL | 5 refills | Status: AC

## 2024-09-26 NOTE — Telephone Encounter (Signed)
 Eliquis  5mg  refill request received. Patient is 58 years old, weight-53.1kg, Crea-0.76 on 03/22/24, Diagnosis-PE, and last seen by Dr. Zenaida on 06/15/24. Dose is appropriate based on dosing criteria. Will send in refill to requested pharmacy.    06/15/24 PE: Noted on prior V/Q scan, currently on apixaban  5mg  BID - Lifelong therapy

## 2024-10-10 ENCOUNTER — Other Ambulatory Visit: Payer: Self-pay

## 2024-10-10 ENCOUNTER — Encounter (HOSPITAL_COMMUNITY): Payer: Self-pay

## 2024-10-10 ENCOUNTER — Inpatient Hospital Stay (HOSPITAL_COMMUNITY)
Admission: EM | Admit: 2024-10-10 | Discharge: 2024-10-16 | DRG: 368 | Disposition: A | Attending: Internal Medicine | Admitting: Internal Medicine

## 2024-10-10 DIAGNOSIS — K226 Gastro-esophageal laceration-hemorrhage syndrome: Principal | ICD-10-CM

## 2024-10-10 DIAGNOSIS — E43 Unspecified severe protein-calorie malnutrition: Secondary | ICD-10-CM | POA: Insufficient documentation

## 2024-10-10 DIAGNOSIS — D62 Acute posthemorrhagic anemia: Secondary | ICD-10-CM | POA: Insufficient documentation

## 2024-10-10 DIAGNOSIS — D500 Iron deficiency anemia secondary to blood loss (chronic): Secondary | ICD-10-CM

## 2024-10-10 DIAGNOSIS — I2724 Chronic thromboembolic pulmonary hypertension: Secondary | ICD-10-CM | POA: Diagnosis present

## 2024-10-10 DIAGNOSIS — F17210 Nicotine dependence, cigarettes, uncomplicated: Secondary | ICD-10-CM

## 2024-10-10 DIAGNOSIS — K922 Gastrointestinal hemorrhage, unspecified: Principal | ICD-10-CM | POA: Diagnosis present

## 2024-10-10 HISTORY — DX: Other pulmonary embolism without acute cor pulmonale: I26.99

## 2024-10-10 HISTORY — DX: Alcohol abuse, in remission: F10.11

## 2024-10-10 HISTORY — DX: Anemia, unspecified: D64.9

## 2024-10-10 HISTORY — DX: Chronic thromboembolic pulmonary hypertension: I27.24

## 2024-10-10 HISTORY — DX: Chronic obstructive pulmonary disease, unspecified: J44.9

## 2024-10-10 HISTORY — DX: Pulmonary hypertension, unspecified: I27.20

## 2024-10-10 NOTE — ED Triage Notes (Signed)
 Pt to ED from home with c/o  emesis that started about an hour ago, pt endorses acid reflux started after eating flounder and shrimp at [place], and progressed to vomiting. Pt denies any new pain or diarrhea.

## 2024-10-11 ENCOUNTER — Emergency Department (HOSPITAL_COMMUNITY)

## 2024-10-11 ENCOUNTER — Observation Stay (HOSPITAL_COMMUNITY): Admitting: Certified Registered"

## 2024-10-11 ENCOUNTER — Encounter (HOSPITAL_COMMUNITY): Payer: Self-pay | Admitting: Internal Medicine

## 2024-10-11 ENCOUNTER — Inpatient Hospital Stay (HOSPITAL_COMMUNITY)

## 2024-10-11 ENCOUNTER — Encounter (HOSPITAL_COMMUNITY): Admission: EM | Disposition: A | Payer: Self-pay | Source: Home / Self Care | Attending: Internal Medicine

## 2024-10-11 DIAGNOSIS — D72829 Elevated white blood cell count, unspecified: Secondary | ICD-10-CM

## 2024-10-11 DIAGNOSIS — K922 Gastrointestinal hemorrhage, unspecified: Secondary | ICD-10-CM | POA: Diagnosis not present

## 2024-10-11 DIAGNOSIS — R7989 Other specified abnormal findings of blood chemistry: Secondary | ICD-10-CM

## 2024-10-11 DIAGNOSIS — D500 Iron deficiency anemia secondary to blood loss (chronic): Secondary | ICD-10-CM

## 2024-10-11 DIAGNOSIS — D509 Iron deficiency anemia, unspecified: Secondary | ICD-10-CM | POA: Diagnosis not present

## 2024-10-11 DIAGNOSIS — K920 Hematemesis: Secondary | ICD-10-CM

## 2024-10-11 DIAGNOSIS — D75839 Thrombocytosis, unspecified: Secondary | ICD-10-CM

## 2024-10-11 DIAGNOSIS — Z86711 Personal history of pulmonary embolism: Secondary | ICD-10-CM

## 2024-10-11 DIAGNOSIS — K449 Diaphragmatic hernia without obstruction or gangrene: Secondary | ICD-10-CM

## 2024-10-11 DIAGNOSIS — F1010 Alcohol abuse, uncomplicated: Secondary | ICD-10-CM

## 2024-10-11 DIAGNOSIS — D62 Acute posthemorrhagic anemia: Secondary | ICD-10-CM

## 2024-10-11 DIAGNOSIS — Z7901 Long term (current) use of anticoagulants: Secondary | ICD-10-CM

## 2024-10-11 DIAGNOSIS — J449 Chronic obstructive pulmonary disease, unspecified: Secondary | ICD-10-CM

## 2024-10-11 HISTORY — PX: ESOPHAGOGASTRODUODENOSCOPY: SHX5428

## 2024-10-11 LAB — COMPREHENSIVE METABOLIC PANEL WITH GFR
ALT: 9 U/L (ref 0–44)
AST: 17 U/L (ref 15–41)
Albumin: 3.8 g/dL (ref 3.5–5.0)
Alkaline Phosphatase: 106 U/L (ref 38–126)
Anion gap: 13 (ref 5–15)
BUN: 11 mg/dL (ref 6–20)
CO2: 22 mmol/L (ref 22–32)
Calcium: 8.6 mg/dL — ABNORMAL LOW (ref 8.9–10.3)
Chloride: 101 mmol/L (ref 98–111)
Creatinine, Ser: 0.83 mg/dL (ref 0.61–1.24)
GFR, Estimated: >60 mL/min (ref >60)
Glucose, Bld: 111 mg/dL — ABNORMAL HIGH (ref 70–99)
Potassium: 4.3 mmol/L (ref 3.5–5.1)
Sodium: 136 mmol/L (ref 135–145)
Total Bilirubin: 0.2 mg/dL (ref 0.0–1.2)
Total Protein: 7 g/dL (ref 6.5–8.1)

## 2024-10-11 LAB — POCT I-STAT 7, (LYTES, BLD GAS, ICA,H+H)
Acid-Base Excess: 3 mmol/L — ABNORMAL HIGH (ref 0.0–2.0)
Bicarbonate: 26.1 mmol/L (ref 20.0–28.0)
Calcium, Ion: 1.08 mmol/L — ABNORMAL LOW (ref 1.15–1.40)
HCT: 26 % — ABNORMAL LOW (ref 39.0–52.0)
Hemoglobin: 8.8 g/dL — ABNORMAL LOW (ref 13.0–17.0)
O2 Saturation: 99 %
Patient temperature: 98.6
Potassium: 3.6 mmol/L (ref 3.5–5.1)
Sodium: 136 mmol/L (ref 135–145)
TCO2: 27 mmol/L (ref 22–32)
pCO2 arterial: 33.8 mmHg (ref 32–48)
pH, Arterial: 7.497 — ABNORMAL HIGH (ref 7.35–7.45)
pO2, Arterial: 122 mmHg — ABNORMAL HIGH (ref 83–108)

## 2024-10-11 LAB — HEMOGLOBIN AND HEMATOCRIT, BLOOD
HCT: 26.2 % — ABNORMAL LOW (ref 39.0–52.0)
HCT: 30.6 % — ABNORMAL LOW (ref 39.0–52.0)
HCT: 27.2 % — ABNORMAL LOW (ref 39.0–52.0)
Hemoglobin: 8 g/dL — ABNORMAL LOW (ref 13.0–17.0)
Hemoglobin: 9.5 g/dL — ABNORMAL LOW (ref 13.0–17.0)
Hemoglobin: 8.6 g/dL — ABNORMAL LOW (ref 13.0–17.0)

## 2024-10-11 LAB — PREPARE RBC (CROSSMATCH)

## 2024-10-11 LAB — GLUCOSE, CAPILLARY
Glucose-Capillary: 137 mg/dL — ABNORMAL HIGH (ref 70–99)
Glucose-Capillary: 150 mg/dL — ABNORMAL HIGH (ref 70–99)
Glucose-Capillary: 141 mg/dL — ABNORMAL HIGH (ref 70–99)
Glucose-Capillary: 159 mg/dL — ABNORMAL HIGH (ref 70–99)

## 2024-10-11 LAB — FERRITIN: Ferritin: 25 ng/mL (ref 24–336)

## 2024-10-11 LAB — ABO/RH: ABO/RH(D): A POS

## 2024-10-11 LAB — CBC
HCT: 17.4 % — ABNORMAL LOW (ref 39.0–52.0)
Hemoglobin: 4.7 g/dL — CL (ref 13.0–17.0)
MCH: 19.9 pg — ABNORMAL LOW (ref 26.0–34.0)
MCHC: 27 g/dL — ABNORMAL LOW (ref 30.0–36.0)
MCV: 73.7 fL — ABNORMAL LOW (ref 80.0–100.0)
Platelets: 588 K/uL — ABNORMAL HIGH (ref 150–400)
RBC: 2.36 MIL/uL — ABNORMAL LOW (ref 4.22–5.81)
RDW: 25.6 % — ABNORMAL HIGH (ref 11.5–15.5)
WBC: 18 K/uL — ABNORMAL HIGH (ref 4.0–10.5)
nRBC: 0 % (ref 0.0–0.2)

## 2024-10-11 LAB — HIV ANTIBODY (ROUTINE TESTING W REFLEX): HIV Screen 4th Generation wRfx: NONREACTIVE

## 2024-10-11 LAB — IRON AND TIBC
Iron: 227 ug/dL — ABNORMAL HIGH (ref 45–182)
Saturation Ratios: 52 % — ABNORMAL HIGH (ref 17.9–39.5)
TIBC: 434 ug/dL (ref 250–450)
UIBC: 207 ug/dL

## 2024-10-11 LAB — MAGNESIUM: Magnesium: 2.4 mg/dL (ref 1.7–2.4)

## 2024-10-11 LAB — PROTIME-INR
INR: 1.2 (ref 0.8–1.2)
Prothrombin Time: 15.5 s — ABNORMAL HIGH (ref 11.4–15.2)

## 2024-10-11 LAB — MRSA NEXT GEN BY PCR, NASAL
MRSA by PCR Next Gen: DETECTED — AB
MRSA by PCR Next Gen: DETECTED — AB

## 2024-10-11 LAB — TROPONIN T, HIGH SENSITIVITY
Troponin T High Sensitivity: 33 ng/L — ABNORMAL HIGH (ref 0–19)
Troponin T High Sensitivity: 20 ng/L — ABNORMAL HIGH (ref 0–19)

## 2024-10-11 LAB — LIPASE, BLOOD: Lipase: 17 U/L (ref 11–51)

## 2024-10-11 LAB — PHOSPHORUS: Phosphorus: 3.4 mg/dL (ref 2.5–4.6)

## 2024-10-11 SURGERY — EGD (ESOPHAGOGASTRODUODENOSCOPY)
Anesthesia: General

## 2024-10-11 MED ORDER — OCTREOTIDE ACETATE 500 MCG/ML IJ SOLN
INTRAMUSCULAR | Status: AC
  Filled 2024-10-11: qty 1

## 2024-10-11 MED ORDER — METOCLOPRAMIDE HCL 5 MG/ML IJ SOLN
10.0000 mg | Freq: Once | INTRAMUSCULAR | Status: AC
  Administered 2024-10-11: 10 mg via INTRAVENOUS
  Filled 2024-10-11: qty 2

## 2024-10-11 MED ORDER — ADULT MULTIVITAMIN W/MINERALS CH
1.0000 | ORAL_TABLET | Freq: Every day | ORAL | Status: DC
  Administered 2024-10-14 – 2024-10-16 (×3): 1 via ORAL
  Filled 2024-10-11 (×3): qty 1

## 2024-10-11 MED ORDER — THIAMINE MONONITRATE 100 MG PO TABS
100.0000 mg | ORAL_TABLET | Freq: Every day | ORAL | Status: DC
  Administered 2024-10-14 – 2024-10-16 (×2): 100 mg via ORAL
  Filled 2024-10-11 (×3): qty 1

## 2024-10-11 MED ORDER — FOLIC ACID 1 MG PO TABS: 1.0000 mg | ORAL_TABLET | Freq: Every day | ORAL | Status: DC

## 2024-10-11 MED ORDER — ONDANSETRON HCL 4 MG/2ML IJ SOLN: 4.0000 mg | Freq: Four times a day (QID) | INTRAMUSCULAR | Status: DC | PRN

## 2024-10-11 MED ORDER — PANTOPRAZOLE SODIUM 40 MG IV SOLR
40.0000 mg | Freq: Once | INTRAVENOUS | Status: AC
  Administered 2024-10-11: 40 mg via INTRAVENOUS
  Filled 2024-10-11: qty 10

## 2024-10-11 MED ORDER — MORPHINE SULFATE (PF) 4 MG/ML IV SOLN
4.0000 mg | Freq: Once | INTRAVENOUS | Status: AC
  Administered 2024-10-11: 4 mg via INTRAVENOUS
  Filled 2024-10-11: qty 1

## 2024-10-11 MED ORDER — REVEFENACIN 175 MCG/3ML IN SOLN
175.0000 ug | Freq: Every day | RESPIRATORY_TRACT | Status: DC
  Administered 2024-10-12 – 2024-10-13 (×2): 175 ug via RESPIRATORY_TRACT
  Filled 2024-10-11 (×2): qty 3

## 2024-10-11 MED ORDER — ORAL CARE MOUTH RINSE
15.0000 mL | OROMUCOSAL | Status: DC
  Administered 2024-10-11 – 2024-10-13 (×20): 15 mL via OROMUCOSAL

## 2024-10-11 MED ORDER — PROPOFOL 10 MG/ML IV BOLUS
INTRAVENOUS | Status: DC | PRN
  Administered 2024-10-11 (×2): 50 mg via INTRAVENOUS

## 2024-10-11 MED ORDER — ROCURONIUM BROMIDE 10 MG/ML (PF) SYRINGE
PREFILLED_SYRINGE | INTRAVENOUS | Status: DC | PRN
  Administered 2024-10-11: 30 mg via INTRAVENOUS
  Administered 2024-10-11: 50 mg via INTRAVENOUS

## 2024-10-11 MED ORDER — DOCUSATE SODIUM 50 MG/5ML PO LIQD: 100.0000 mg | Freq: Two times a day (BID) | ORAL | Status: DC

## 2024-10-11 MED ORDER — LORAZEPAM 2 MG/ML IJ SOLN: 1.0000 mg | INTRAMUSCULAR | Status: AC | PRN

## 2024-10-11 MED ORDER — MORPHINE SULFATE (PF) 2 MG/ML IV SOLN
2.0000 mg | INTRAVENOUS | Status: DC | PRN
  Administered 2024-10-11 (×2): 2 mg via INTRAVENOUS
  Filled 2024-10-11 (×2): qty 1

## 2024-10-11 MED ORDER — FENTANYL CITRATE (PF) 50 MCG/ML IJ SOSY: 25.0000 ug | PREFILLED_SYRINGE | Freq: Once | INTRAMUSCULAR | Status: DC

## 2024-10-11 MED ORDER — FENTANYL 2500MCG IN NS 250ML (10MCG/ML) PREMIX INFUSION
0.0000 ug/h | INTRAVENOUS | Status: DC
  Administered 2024-10-11 – 2024-10-13 (×2): 50 ug/h via INTRAVENOUS
  Filled 2024-10-11 (×2): qty 250

## 2024-10-11 MED ORDER — INFLUENZA VIRUS VACC SPLIT PF (FLUZONE) 0.5 ML IM SUSY
0.5000 mL | PREFILLED_SYRINGE | INTRAMUSCULAR | Status: DC
  Filled 2024-10-11: qty 0.5

## 2024-10-11 MED ORDER — POLYETHYLENE GLYCOL 3350 17 G PO PACK: 17.0000 g | PACK | Freq: Every day | ORAL | Status: DC

## 2024-10-11 MED ORDER — FUROSEMIDE 10 MG/ML IJ SOLN
20.0000 mg | Freq: Once | INTRAMUSCULAR | Status: AC
  Administered 2024-10-11: 20 mg via INTRAVENOUS
  Filled 2024-10-11: qty 2

## 2024-10-11 MED ORDER — VITAMIN K1 10 MG/ML IJ SOLN
10.0000 mg | INTRAVENOUS | Status: DC
  Filled 2024-10-11: qty 1

## 2024-10-11 MED ORDER — IPRATROPIUM-ALBUTEROL 0.5-2.5 (3) MG/3ML IN SOLN
3.0000 mL | Freq: Four times a day (QID) | RESPIRATORY_TRACT | Status: DC | PRN
  Administered 2024-10-13 – 2024-10-15 (×3): 3 mL via RESPIRATORY_TRACT
  Filled 2024-10-11 (×3): qty 3

## 2024-10-11 MED ORDER — BUDESONIDE 0.25 MG/2ML IN SUSP
0.2500 mg | Freq: Two times a day (BID) | RESPIRATORY_TRACT | Status: DC
  Administered 2024-10-12 – 2024-10-13 (×4): 0.25 mg via RESPIRATORY_TRACT
  Filled 2024-10-11 (×4): qty 2

## 2024-10-11 MED ORDER — ARFORMOTEROL TARTRATE 15 MCG/2ML IN NEBU
15.0000 ug | INHALATION_SOLUTION | Freq: Two times a day (BID) | RESPIRATORY_TRACT | Status: DC
  Administered 2024-10-12 – 2024-10-13 (×4): 15 ug via RESPIRATORY_TRACT
  Filled 2024-10-11 (×3): qty 2

## 2024-10-11 MED ORDER — PANTOPRAZOLE SODIUM 40 MG IV SOLR
40.0000 mg | Freq: Two times a day (BID) | INTRAVENOUS | Status: DC
  Administered 2024-10-11 – 2024-10-16 (×11): 40 mg via INTRAVENOUS
  Filled 2024-10-11 (×10): qty 10

## 2024-10-11 MED ORDER — SUCCINYLCHOLINE CHLORIDE 200 MG/10ML IV SOSY
PREFILLED_SYRINGE | INTRAVENOUS | Status: DC | PRN
  Administered 2024-10-11: 120 mg via INTRAVENOUS

## 2024-10-11 MED ORDER — FENTANYL CITRATE (PF) 100 MCG/2ML IJ SOLN
50.0000 ug | INTRAMUSCULAR | Status: DC | PRN
  Administered 2024-10-11: 50 ug via INTRAVENOUS
  Filled 2024-10-11: qty 2

## 2024-10-11 MED ORDER — THIAMINE HCL 100 MG/ML IJ SOLN
100.0000 mg | Freq: Every day | INTRAMUSCULAR | Status: DC
  Administered 2024-10-12 – 2024-10-15 (×3): 100 mg via INTRAVENOUS
  Filled 2024-10-11 (×5): qty 2

## 2024-10-11 MED ORDER — SODIUM CHLORIDE 0.9 % IV SOLN
250.0000 mg | Freq: Once | INTRAVENOUS | Status: AC
  Administered 2024-10-11: 250 mg via INTRAVENOUS
  Filled 2024-10-11: qty 5

## 2024-10-11 MED ORDER — ONDANSETRON HCL 4 MG/2ML IJ SOLN
INTRAMUSCULAR | Status: DC | PRN
  Administered 2024-10-11: 4 mg via INTRAVENOUS

## 2024-10-11 MED ORDER — LORAZEPAM 1 MG PO TABS: 1.0000 mg | ORAL_TABLET | ORAL | Status: AC | PRN

## 2024-10-11 MED ORDER — SODIUM CHLORIDE 0.9 % IV SOLN
50.0000 ug/h | INTRAVENOUS | Status: AC
  Administered 2024-10-11 – 2024-10-14 (×8): 50 ug/h via INTRAVENOUS
  Filled 2024-10-11 (×12): qty 1

## 2024-10-11 MED ORDER — SODIUM CHLORIDE 0.9 % IV SOLN
2.0000 g | INTRAVENOUS | Status: AC
  Administered 2024-10-11 – 2024-10-15 (×5): 2 g via INTRAVENOUS
  Filled 2024-10-11 (×5): qty 20

## 2024-10-11 MED ORDER — LACTATED RINGERS IV SOLN: INTRAVENOUS | Status: DC

## 2024-10-11 MED ORDER — ETOMIDATE 2 MG/ML IV SOLN
INTRAVENOUS | Status: DC | PRN
  Administered 2024-10-11: 10 mg via INTRAVENOUS

## 2024-10-11 MED ORDER — CHLORHEXIDINE GLUCONATE CLOTH 2 % EX PADS
6.0000 | MEDICATED_PAD | Freq: Every day | CUTANEOUS | Status: DC
  Administered 2024-10-11 – 2024-10-12 (×2): 6 via TOPICAL

## 2024-10-11 MED ORDER — FENTANYL BOLUS VIA INFUSION
25.0000 ug | INTRAVENOUS | Status: DC | PRN
  Administered 2024-10-13: 100 ug via INTRAVENOUS

## 2024-10-11 MED ORDER — PROPOFOL 1000 MG/100ML IV EMUL
0.0000 ug/kg/min | INTRAVENOUS | Status: DC
  Administered 2024-10-11 – 2024-10-13 (×7): 50 ug/kg/min via INTRAVENOUS
  Filled 2024-10-11 (×3): qty 100
  Filled 2024-10-11 (×2): qty 200
  Filled 2024-10-11: qty 100

## 2024-10-11 MED ORDER — ONDANSETRON HCL 4 MG PO TABS: 4.0000 mg | ORAL_TABLET | Freq: Four times a day (QID) | ORAL | Status: DC | PRN

## 2024-10-11 MED ORDER — IOHEXOL 350 MG/ML SOLN
100.0000 mL | Freq: Once | INTRAVENOUS | Status: AC | PRN
  Administered 2024-10-11: 100 mL via INTRAVENOUS

## 2024-10-11 MED ORDER — ONDANSETRON HCL 4 MG/2ML IJ SOLN
4.0000 mg | Freq: Once | INTRAMUSCULAR | Status: AC
  Administered 2024-10-11: 4 mg via INTRAVENOUS
  Filled 2024-10-11: qty 2

## 2024-10-11 MED ORDER — PROTHROMBIN COMPLEX CONC HUMAN 500 UNITS IV KIT: 1500.0000 [IU] | PACK | Status: DC

## 2024-10-11 MED ORDER — ORAL CARE MOUTH RINSE: 15.0000 mL | OROMUCOSAL | Status: DC | PRN

## 2024-10-11 MED ORDER — SODIUM CHLORIDE 0.9% IV SOLUTION: Freq: Once | INTRAVENOUS | Status: AC

## 2024-10-11 MED ORDER — SODIUM CHLORIDE 0.9 % IV BOLUS
1000.0000 mL | Freq: Once | INTRAVENOUS | Status: AC
  Administered 2024-10-11: 1000 mL via INTRAVENOUS

## 2024-10-11 MED ORDER — SODIUM CHLORIDE 0.9 % IV SOLN: INTRAVENOUS | Status: DC

## 2024-10-11 MED ORDER — METRONIDAZOLE 500 MG/100ML IV SOLN
500.0000 mg | Freq: Two times a day (BID) | INTRAVENOUS | Status: DC
  Administered 2024-10-11 – 2024-10-12 (×2): 500 mg via INTRAVENOUS
  Filled 2024-10-11 (×2): qty 100

## 2024-10-11 MED ORDER — LABETALOL HCL 5 MG/ML IV SOLN: 5.0000 mg | Freq: Four times a day (QID) | INTRAVENOUS | Status: DC | PRN

## 2024-10-11 MED ORDER — FENTANYL CITRATE (PF) 100 MCG/2ML IJ SOLN: 50.0000 ug | INTRAMUSCULAR | Status: DC | PRN

## 2024-10-11 MED ORDER — SODIUM CHLORIDE 0.9 % IV SOLN: 1.0000 g | INTRAVENOUS | Status: DC

## 2024-10-11 MED ORDER — DEXTROSE-SODIUM CHLORIDE 5-0.45 % IV SOLN: INTRAVENOUS | Status: DC

## 2024-10-11 MED ORDER — SODIUM CHLORIDE 0.9 % IV SOLN
1.0000 g | Freq: Once | INTRAVENOUS | Status: AC
  Administered 2024-10-11: 1 g via INTRAVENOUS
  Filled 2024-10-11: qty 10

## 2024-10-11 MED ORDER — PROTHROMBIN COMPLEX CONC HUMAN 500 UNITS IV KIT
2701.0000 [IU] | PACK | Status: AC
  Administered 2024-10-11: 2701 [IU] via INTRAVENOUS
  Filled 2024-10-11: qty 2000

## 2024-10-11 NOTE — ED Notes (Signed)
 Patient transported to CT

## 2024-10-11 NOTE — Progress Notes (Signed)
 Reviewed this evening's chest CT.  No overt perforation.  High density material in the esophagus likely Hemospray/clot.  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  Hemospray may have slowed the bleed.  Would not be unreasonable to transfer to tertiary care center where intensivist care available.  If he stays here, leave him intubated continue antibiotics at least overnight,track hemoglobin and we will reassess tomorrow morning.  I have messaged Dr. Ricky regarding recommendations.

## 2024-10-11 NOTE — Plan of Care (Signed)
   Problem: Clinical Measurements: Goal: Will remain free from infection Outcome: Progressing Goal: Cardiovascular complication will be avoided Outcome: Progressing

## 2024-10-11 NOTE — ED Provider Notes (Signed)
 [facility] Emergency Department Provider Note MRN:  [id number]  Arrival date & time: 10/12/24     Chief Complaint   Emesis   History of Present Illness   Alan [name] is a 58 y.o. year-old male with history of PE on Eliquis  presenting to the ED with chief complaint of emesis.  Nausea vomiting and some abdominal discomfort starting about an hour ago.  Started soon after eating seafood at a restaurant.  Several episodes of vomiting that has since become bloody.  Mild pain in the epigastrium starting after the vomiting, also having some chest discomfort since the vomiting started.  Review of Systems  A thorough review of systems was obtained and all systems are negative except as noted in the HPI and PMH.   Patient's Health History    Past Medical History:  Diagnosis Date   Anemia    COPD (chronic obstructive pulmonary disease) (HCC)    CTEPH (chronic thromboembolic pulmonary hypertension) (HCC)    History of ETOH abuse    Pulmonary embolism (HCC)    On Eliquis    Pulmonary hypertension (HCC)    Right knee pain    Right shoulder pain     Past Surgical History:  Procedure Laterality Date   PULMONARY ANGIOGRAPHY N/A 04/06/2024   Procedure: PULMONARY ANGIOGRAPHY;  Surgeon: Zenaida Morene PARAS, MD;  [facility] IN[facility];  Service: Cardiovascular;  Laterality: N/A;   RIGHT HEART CATH N/A 10/13/2023   Procedure: RIGHT HEART CATH;  Surgeon: Anner Alm ORN, MD;  Location: [facility] INVASIVE CV LAB;  Service: Cardiovascular;  Laterality: N/A;   RIGHT HEART CATH N/A 04/06/2024   Procedure: RIGHT HEART CATH;  Surgeon: Zenaida Morene PARAS, MD;  Location: [facility] INVASIVE CV LAB;  Service: Cardiovascular;  Laterality: N/A;   TONSILLECTOMY  58 yo    Family History  Problem Relation Age of Onset   Heart disease Mother    Diabetes Mother    Diabetes Father    Asthma Brother     Social History   Socioeconomic History   Marital status: Divorced    Spouse name:  Not on file   Number of children: Not on file   Years of education: Not on file   Highest education level: Not on file  Occupational History   Not on file  Tobacco Use   Smoking status: Every Day    Current packs/day: 1.50    Average packs/day: 1.5 packs/day for 43.3 years (65.0 ttl pk-yrs)    Types: Cigarettes    Start date: 06/12/1981   Smokeless tobacco: Never   Tobacco comments:    Smokes 1ppd ... Verified by [facility] 05/12/2023  Vaping Use   Vaping status: Never Used  Substance and Sexual Activity   Alcohol use: Yes    Comment: 12 pack/week   Drug use: Yes    Types: Marijuana    Comment: MJ daily .  no cocaine since 2021   Sexual activity: Yes  Other Topics Concern   Not on file  Social History Narrative   Lives alone.  Disabled.   Social Drivers of Corporate Investment Banker Strain: Not on file  Food Insecurity: No Food Insecurity (10/11/2024)   Hunger Vital Sign    Worried About Running Out of Food in the Last Year: Never true    Ran Out of Food in the Last Year: Never true  Transportation Needs: No Transportation Needs (10/11/2024)   PRAPARE - Administrator, Civil Service (Medical):  No    Lack of Transportation (Non-Medical): No  Physical Activity: Not on file  Stress: Not on file  Social Connections: Not on file  Intimate Partner Violence: Not At Risk (10/11/2024)   Humiliation, Afraid, Rape, and Kick questionnaire    Fear of Current or Ex-Partner: No    Emotionally Abused: No    Physically Abused: No    Sexually Abused: No     Physical Exam   Vitals:   10/12/24 0500 10/12/24 0600  BP: 96/76 (!) 87/68  Pulse: 68 71  Resp: 18 18  Temp:    SpO2: 99% 99%    CONSTITUTIONAL: Well-appearing, NAD NEURO/PSYCH:  Alert and oriented x 3, no focal deficits EYES:  eyes equal and reactive ENT/NECK:  no LAD, no JVD CARDIO: Regular rate, well-perfused, normal S1 and S2 PULM:  CTAB no wheezing or rhonchi GI/GU:  non-distended, non-tender MSK/SPINE:  No  gross deformities, no edema SKIN:  no rash, atraumatic   *Additional and/or pertinent findings included in MDM below  Diagnostic and Interventional Summary    EKG Interpretation Date/Time:  Wednesday October 11 2024 00:23:26 EST Ventricular Rate:  111 PR Interval:  142 QRS Duration:  90 QT Interval:  334 QTC Calculation: 454 R Axis:   100  Text Interpretation: Sinus tachycardia Right axis deviation Minimal ST depression, inferior leads Confirmed by Theadore Sharper [phone]) on 10/11/2024 1:34:34 AM       Labs Reviewed  MRSA NEXT GEN BY PCR, NASAL - Abnormal; Notable for the following components:      Result Value   MRSA by PCR Next Gen DETECTED (*)    All other components within normal limits  MRSA NEXT GEN BY PCR, NASAL - Abnormal; Notable for the following components:   MRSA by PCR Next Gen DETECTED (*)    All other components within normal limits  CBC - Abnormal; Notable for the following components:   WBC 18.0 (*)    RBC 2.36 (*)    Hemoglobin 4.7 (*)    HCT 17.4 (*)    MCV 73.7 (*)    MCH 19.9 (*)    MCHC 27.0 (*)    RDW 25.6 (*)    Platelets 588 (*)    All other components within normal limits  COMPREHENSIVE METABOLIC PANEL WITH GFR - Abnormal; Notable for the following components:   Glucose, Bld 111 (*)    Calcium 8.6 (*)    All other components within normal limits  PROTIME-INR - Abnormal; Notable for the following components:   Prothrombin Time 15.5 (*)    All other components within normal limits  IRON AND TIBC - Abnormal; Notable for the following components:   Iron 227 (*)    Saturation Ratios 52 (*)    All other components within normal limits  HEMOGLOBIN AND HEMATOCRIT, BLOOD - Abnormal; Notable for the following components:   Hemoglobin 8.0 (*)    HCT 26.2 (*)    All other components within normal limits  HEMOGLOBIN AND HEMATOCRIT, BLOOD - Abnormal; Notable for the following components:   Hemoglobin 9.5 (*)    HCT 30.6 (*)    All other components  within normal limits  GLUCOSE, CAPILLARY - Abnormal; Notable for the following components:   Glucose-Capillary 137 (*)    All other components within normal limits  GLUCOSE, CAPILLARY - Abnormal; Notable for the following components:   Glucose-Capillary 150 (*)    All other components within normal limits  GLUCOSE, CAPILLARY - Abnormal; Notable for the  following components:   Glucose-Capillary 141 (*)    All other components within normal limits  HEMOGLOBIN AND HEMATOCRIT, BLOOD - Abnormal; Notable for the following components:   Hemoglobin 8.6 (*)    HCT 27.2 (*)    All other components within normal limits  GLUCOSE, CAPILLARY - Abnormal; Notable for the following components:   Glucose-Capillary 159 (*)    All other components within normal limits  POCT I-STAT 7, (LYTES, BLD GAS, ICA,H+H) - Abnormal; Notable for the following components:   pH, Arterial 7.497 (*)    pO2, Arterial 122 (*)    Acid-Base Excess 3.0 (*)    Calcium, Ion 1.08 (*)    HCT 26.0 (*)    Hemoglobin 8.8 (*)    All other components within normal limits  TROPONIN T, HIGH SENSITIVITY - Abnormal; Notable for the following components:   Troponin T High Sensitivity 33 (*)    All other components within normal limits  TROPONIN T, HIGH SENSITIVITY - Abnormal; Notable for the following components:   Troponin T High Sensitivity 20 (*)    All other components within normal limits  LIPASE, BLOOD  FERRITIN  HIV ANTIBODY (ROUTINE TESTING W REFLEX)  MAGNESIUM   PHOSPHORUS  BLOOD GAS, ARTERIAL  TYPE AND SCREEN  PREPARE RBC (CROSSMATCH)  ABO/RH  PREPARE RBC (CROSSMATCH)  BLOOD TRANSFUSION REPORT - SCANNED   Narrative:    Ordered by an unspecified provider.  TYPE AND SCREEN    CT CHEST WO CONTRAST  Final Result    CT ANGIO GI BLEED  Final Result    DG Chest Port 1 View  Final Result    Portable Chest xray    (Results Pending)    Medications  octreotide (SANDOSTATIN) 500 mcg in sodium chloride  0.9 % 250 mL (2  mcg/mL) infusion (50 mcg/hr Intravenous Infusion Verify 10/12/24 0600)  pantoprazole (PROTONIX) injection 40 mg (40 mg Intravenous Given 10/11/24 2131)  morphine (PF) 2 MG/ML injection 2 mg ( Intravenous MAR Unhold 10/11/24 1527)  ondansetron  (ZOFRAN ) tablet 4 mg ( Oral MAR Unhold 10/11/24 1527)    Or  ondansetron  (ZOFRAN ) injection 4 mg ( Intravenous MAR Unhold 10/11/24 1527)  Chlorhexidine  Gluconate Cloth 2 % PADS 6 each (6 each Topical Given 10/12/24 0612)  influenza vac split trivalent PF (FLUZONE) injection 0.5 mL (has no administration in time range)  labetalol  (NORMODYNE ) injection 5 mg ( Intravenous MAR Unhold 10/11/24 1527)  propofol (DIPRIVAN) 1000 MG/100ML infusion (50 mcg/kg/min  51.1 kg Intravenous Infusion Verify 10/12/24 0600)  cefTRIAXone (ROCEPHIN) 2 g in sodium chloride  0.9 % 100 mL IVPB (0 g Intravenous Paused 10/11/24 2200)  metroNIDAZOLE (FLAGYL) IVPB 500 mg (0 mg Intravenous Stopped 10/12/24 0559)  dextrose 5 % and 0.45 % NaCl infusion ( Intravenous Infusion Verify 10/12/24 0600)  fentaNYL (SUBLIMAZE) injection 25-50 mcg ( Intravenous Not Given 10/11/24 2109)  fentaNYL 2500mcg in NS 250mL (10mcg/ml) infusion-PREMIX (50 mcg/hr Intravenous Infusion Verify 10/12/24 0600)  fentaNYL (SUBLIMAZE) bolus via infusion 25-100 mcg (has no administration in time range)  Oral care mouth rinse (15 mLs Mouth Rinse Given 10/12/24 0612)  Oral care mouth rinse (has no administration in time range)  LORazepam (ATIVAN) tablet 1-4 mg (has no administration in time range)    Or  LORazepam (ATIVAN) injection 1-4 mg (has no administration in time range)  thiamine (VITAMIN B1) tablet 100 mg (has no administration in time range)    Or  thiamine (VITAMIN B1) injection 100 mg (has no administration in time range)  folic  acid (FOLVITE) tablet 1 mg (has no administration in time range)  multivitamin with minerals tablet 1 tablet (has no administration in time range)  arformoterol (BROVANA) nebulizer solution 15  mcg (has no administration in time range)  revefenacin (YUPELRI) nebulizer solution 175 mcg (has no administration in time range)  budesonide (PULMICORT) nebulizer solution 0.25 mg (has no administration in time range)  ipratropium-albuterol  (DUONEB) 0.5-2.5 (3) MG/3ML nebulizer solution 3 mL (has no administration in time range)  sodium chloride  0.9 % bolus 1,000 mL (0 mLs Intravenous Stopped 10/11/24 0114)  ondansetron  (ZOFRAN ) injection 4 mg (4 mg Intravenous Given 10/11/24 0026)  pantoprazole (PROTONIX) injection 40 mg (40 mg Intravenous Given 10/11/24 0026)  morphine (PF) 4 MG/ML injection 4 mg (4 mg Intravenous Given 10/11/24 0026)  0.9 %  sodium chloride  infusion (Manually program via Guardrails IV Fluids) (0 mLs Intravenous Stopping previously hung infusion 10/11/24 2127)  pantoprazole (PROTONIX) injection 40 mg (40 mg Intravenous Given 10/11/24 0121)  morphine (PF) 4 MG/ML injection 4 mg (4 mg Intravenous Given 10/11/24 0121)  iohexol  (OMNIPAQUE ) 350 MG/ML injection 100 mL (100 mLs Intravenous Contrast Given 10/11/24 0152)  prothrombin complex conc human (KCENTRA) IVPB 2,701 Units (0 Units Intravenous Stopped 10/11/24 0304)  cefTRIAXone (ROCEPHIN) 1 g in sodium chloride  0.9 % 100 mL IVPB (0 g Intravenous Stopped 10/11/24 0414)  metoCLOPramide (REGLAN) injection 10 mg (10 mg Intravenous Given 10/11/24 0839)  0.9 %  sodium chloride  infusion (Manually program via Guardrails IV Fluids) (0 mLs Intravenous Stopping previously hung infusion 10/11/24 2127)  furosemide  (LASIX ) injection 20 mg (20 mg Intravenous Given 10/11/24 1133)  erythromycin 250 mg in sodium chloride  0.9 % 100 mL IVPB (0 mg Intravenous Stopped 10/11/24 1350)     Procedures  /  Critical Care .Critical Care  Performed by: Theadore Ozell HERO, MD Authorized by: Theadore Ozell HERO, MD   Critical care provider statement:    Critical care time (minutes):  80   Critical care was necessary to treat or prevent imminent or life-threatening  deterioration of the following conditions: Profound anemia requiring blood transfusion.   Critical care was time spent personally by me on the following activities:  Development of treatment plan with patient or surrogate, discussions with consultants, evaluation of patient's response to treatment, examination of patient, ordering and review of laboratory studies, ordering and review of radiographic studies, ordering and performing treatments and interventions, pulse oximetry, re-evaluation of patient's condition and review of old charts   ED Course and Medical Decision Making  Initial Impression and Ddx Suspect gastritis possibly related to food poisoning, may also have a Mallory-Weiss tear given the eventual hematemesis after multiple episodes of vomiting.  Must also consider more significant sources of bleeding.  Patient is anticoagulated.  Denies history of liver issues or cirrhosis.  Mild tachycardia.  Abdomen soft.  Providing symptomatic management, awaiting labs.  Past medical/surgical history that increases complexity of ED encounter: PE on Eliquis   Interpretation of Diagnostics I personally reviewed the EKG and my interpretation is as follows: Sinus rhythm, some inferior ST depression  Labs reveal profound anemia with hemoglobin of 4.7  Patient Reassessment and Ultimate Disposition/Management     Patient being transfused 3 units of RBCs.  Patient continues to regurgitate a small amount of bright red blood every minute.  The concern is that patient continues to bleed while anticoagulated.  I discussed with patient and patient's family the pros and cons of anticoagulation reversal.  Unfortunately I feel this is life-threatening bleeding and we will  need to provide Pradaxa.  Consulting gastroenterology to see if patient can be managed here in [place].  Case discussed with Dr. Eartha, patient can be kept here at [facility], EGD in the morning.  Excepted for admission by hospitalist  service.  Patient management required discussion with the following services or consulting groups:  Hospitalist Service and Gastroenterology  Complexity of Problems Addressed Acute illness or injury that poses threat of life of bodily function  Additional Data Reviewed and Analyzed Further history obtained from: Further history from spouse/family member  Additional Factors Impacting ED Encounter Risk Consideration of hospitalization  Ozell HERO. Theadore, MD [facility] Emergency Medicine [facility] [email or web address]  Final Clinical Impressions(s) / ED Diagnoses     ICD-10-CM   1. Gastrointestinal hemorrhage, unspecified gastrointestinal hemorrhage type  K92.2       ED Discharge Orders     None        Discharge Instructions Discussed with and Provided to Patient:   Discharge Instructions   None      Theadore Ozell HERO, MD 10/12/24 [phone]

## 2024-10-11 NOTE — ED Notes (Signed)
 Repaged Dr Eartha to Dr Theadore

## 2024-10-11 NOTE — Progress Notes (Signed)
 eLink Physician-Brief Progress Note Patient Name: Alan [name] DOB: 1966/04/29 MRN: [id number]   Date of Service  10/11/2024  HPI/Events of Note  57 year old male with history of EtOH abuse, PE-on Eliquis , who presents with hematemesis at [facility].  Patient is in ICU and Alan [name] Patient had to be intubated for EGD. EGD done showed extensive mucosal tearing of the esophagus, with a high concern for perforation of the distal esophagus.  Transferred to Alan [name] for thoracic surgery evaluation.  Patient's labs, vitals, and imaging were reviewed.  eICU Interventions  Status post 4 units PRBCs, GI following, continue octreotide and PPI drips.  Vasopressors and crystalloids as needed to maintain MAP greater than 65.  Concern for potential mediastinitis-May need be addition of antifungals.  Eliquis  for PE/CTEPH was reversed, monitor for RV dysfunction.  Chest radiograph pending.  DVT prophylaxis with SCDs in the setting of bleeding GI prophylaxis with therapeutic pantoprazole     Intervention Category Evaluation Type: New Patient Evaluation  Alan [name] 10/11/2024, 9:21 PM

## 2024-10-11 NOTE — Progress Notes (Signed)
 Patient taken form ICU to CT and back without any complications.

## 2024-10-11 NOTE — Consult Note (Signed)
 Gastroenterology Consult   Referring Provider: No ref. provider found Primary Care [name]:  Alan [name], Alan [name] Primary Gastroenterologist:  not established (Dr. Shaaron)   Patient ID: Alan [name]; 13-Apr-1966   Admit date: 10/10/2024  LOS: 0 days   Date of Consultation: 10/11/2024  Reason for Consultation:  UGIB   History of Present Illness   Alan [name] is a 58 y.o. year old male with history of ETOH abuse, PE on eliquis  who presented to the ED with c/o emesis, abdominal discomfort an hour prior to arrival, several episodes of vomiting that transitioned to hematemesis with some epigastric pain. GI consulted for further evaluation  ED Course: Hgb 4.7  BP 139/96, heart rate 92, afebrile WBC 18 Plat count 588k (405k 6 months ago) Calcium 8.6  Trop 33  INR 1.2  Iron 227, TIBC 434 sat 52 ferritin 25 CT angio GI bleed:  Intraluminal contrast within the distal esophagus and gastric cardia on venous phase images, possibly reflecting venous hemorrhage or gastroesophageal varices. Correlation with endoscopy would be helpful for further evaluation. 2. Thickening of the distal esophagus, nonspecific and possibly related to esophagitis.  Repeat Hgb this AM 8 (s/p 3 units PRBCs) with 4th unit transfusing  Consult: Patient reports acute onset of nausea vomiting and some epigastric discomfort last night.  Vomiting seem to transition to hematemesis with bright red blood and he reports multiple episodes of this.  Denies any coffee-ground emesis.  He does endorse drinking at least a sixpack of beer per day, drinks liquor on occasion.  He smokes 1 pack/day.  Does take Aleve  back and body daily and has been doing this for quite some time.  Denies any rectal bleeding or melena.  No changes in appetite or weight loss prior to onset of acute illness.  Denies any history of GI bleed in the past.  Last dose of Eliquis  was yesterday evening around 5 to 6 PM.  He endorses  some constipation at baseline.  No previous endoscopies.  He has received 3 units of packed red blood cells with fourth unit transfusing at this time.  EGD: never Colonoscopy:  never   Past Medical History:  Diagnosis Date   Right knee pain    Right shoulder pain     Past Surgical History:  Procedure Laterality Date   PULMONARY ANGIOGRAPHY N/A 04/06/2024   Procedure: PULMONARY ANGIOGRAPHY;  Surgeon: Zenaida Morene PARAS, MD;  Location: [facility] INVASIVE CV LAB;  Service: Cardiovascular;  Laterality: N/A;   RIGHT HEART CATH N/A 10/13/2023   Procedure: RIGHT HEART CATH;  Surgeon: Anner Alm ORN, MD;  Location[facility] INVASIVE CV LAB;  Service: Cardiovascular;  Laterality: N/A;   RIGHT HEART CATH N/A 04/06/2024   Procedure: RIGHT HEART CATH;  Surgeon: Zenaida Morene PARAS, MD;  Location: [facility] INVASIVE CV LAB;  Service: Cardiovascular;  Laterality: N/A;   TONSILLECTOMY  58 yo    Prior to Admission medications   Medication Sig Start Date End Date Taking? Authorizing Provider  ADEMPAS  2.5 MG TABS Take 1 tablet by mouth 3 (three) times daily. 09/21/24   Zenaida Morene PARAS, MD  albuterol  (PROAIR  HFA) 108 [phone] Base) MCG/ACT inhaler 2 puffs every 4 hours as needed only  if your can't catch your breath 11/08/23   Darlean Ozell NOVAK, MD  apixaban  (ELIQUIS ) 5 MG TABS tablet Take 1 tablet (5 mg total) by mouth 2 (two) times daily. 09/26/24   Zenaida Morene PARAS, MD  Aspirin -Caffeine (BAYER BACK & BODY PO) Take  1 tablet by mouth daily as needed (pain).    [provider]  aspirin -sod bicarb-citric acid (ALKA-SELTZER) 325 MG TBEF tablet Take 650 mg by mouth every 6 (six) hours as needed (indigestion).    [provider]  Dihydroxyaluminum Sod Carb (ROLAIDS PO) Take 1 tablet by mouth daily as needed (acid reflux).    [provider]  famotidine-calcium carbonate-magnesium  hydroxide (PEPCID COMPLETE) 10-800-165 MG chewable tablet Chew 1 tablet by mouth daily as needed (acid reflux).    [provider]  mometasone -formoterol  (DULERA ) 100-5 MCG/ACT AERO Inhale 2 puffs into the lungs 2 (two) times daily. 01/17/24   Milian, Marry [name], Alan [name]  Riociguat  (ADEMPAS ) 1 MG TABS Take 1 mg by mouth in the morning, at noon, and at bedtime. 08/25/24   Zenaida Morene PARAS, MD  tiotropium (SPIRIVA ) 18 MCG inhalation capsule Place 1 capsule (18 mcg total) into inhaler and inhale daily. 05/10/24   Darlean Ozell NOVAK, MD  torsemide  (DEMADEX ) 20 MG tablet Take 10 mg by mouth every other day.    [provider]    Current Facility-Administered Medications  Medication Dose Route Frequency Provider Last Rate Last Admin   Chlorhexidine  Gluconate Cloth 2 % PADS 6 each  6 each Topical Q0600 Adefeso, Oladapo, DO   6 each at 10/11/24 0544   furosemide  (LASIX ) injection 20 mg  20 mg Intravenous Once Madera, Carlos, MD       NOREEN ON 10/12/2024] influenza vac split trivalent PF (FLUZONE) injection 0.5 mL  0.5 mL Intramuscular Tomorrow-1000 Adefeso, Oladapo, DO       labetalol  (NORMODYNE ) injection 5 mg  5 mg Intravenous Q6H PRN Adefeso, Oladapo, DO       morphine (PF) 2 MG/ML injection 2 mg  2 mg Intravenous Q3H PRN Adefeso, Oladapo, DO   2 mg at 10/11/24 0745   octreotide (SANDOSTATIN) 500 mcg in sodium chloride  0.9 % 250 mL (2 mcg/mL) infusion  50 mcg/hr Intravenous Continuous Bero, Michael M, MD 25 mL/hr at 10/11/24 0548 50 mcg/hr at 10/11/24 0548   ondansetron  (ZOFRAN ) tablet 4 mg  4 mg Oral Q6H PRN Adefeso, Oladapo, DO       Or   ondansetron  (ZOFRAN ) injection 4 mg  4 mg Intravenous Q6H PRN Adefeso, Oladapo, DO       pantoprazole (PROTONIX) injection 40 mg  40 mg Intravenous Q12H Adefeso, Oladapo, DO   40 mg at 10/11/24 0746    Allergies as of 10/10/2024   (No Known Allergies)    Family History  Problem Relation Age of Onset   Heart disease Mother    Diabetes Mother    Diabetes Father    Asthma Brother      Review of Systems   Gen: Denies any fever, chills, loss of appetite, change  in weight or weight loss CV: Denies chest pain, heart palpitations, syncope, edema  Resp: Denies shortness of breath with rest, cough, wheezing, coughing up blood, and pleurisy. GI:  denies melena, hematochezia, diarrhea, dysphagia, odyonophagia, early satiety or weight loss. +hematemesis +epigastric pain +constipation  GU : Denies urinary burning, blood in urine, urinary frequency, and urinary incontinence. MS: Denies joint pain, limitation of movement, swelling, cramps, and atrophy.  Derm: Denies rash, itching, dry skin, hives. Psych: Denies depression, anxiety, memory loss, hallucinations, and confusion. Heme: Denies bruising or bleeding Neuro:  Denies any headaches, dizziness, paresthesias, shaking  Physical Exam   Vital Signs in last 24 hours: Temp:  [97.5 F (36.4 C)-98.6 F (37 C)] 98.4 F (  36.9 C) (12/03 0841) Pulse Rate:  [92-115] 94 (12/03 0841) Resp:  [12-23] 19 (12/03 0841) BP: (104-167)/(72-103) 167/99 (12/03 0841) SpO2:  [95 %-100 %] 97 % (12/03 0619) Weight:  [51.1 kg-56.7 kg] 51.1 kg (12/03 0500) Last BM Date : 10/10/24  General:   Alert,  Well-developed, well-nourished, pleasant and cooperative in NAD Head:  Normocephalic and atraumatic. Eyes:  Sclera clear, no icterus.   Conjunctiva pink. Ears:  Normal auditory acuity. Mouth:  No deformity or lesions, dentition normal. Neck:  Supple; no masses Lungs:  Clear throughout to auscultation.   No wheezes, crackles, or rhonchi. No acute distress. Heart:  Regular rate and rhythm; no murmurs, clicks, rubs,  or gallops. Abdomen:  Soft, nontender and nondistended. No masses, hepatosplenomegaly or hernias noted. Normal bowel sounds, without guarding, and without rebound.   Msk:  Symmetrical without gross deformities. Normal posture. Extremities:  Without clubbing or edema. Neurologic:  Alert and  oriented x4. Skin:  Intact without significant lesions or rashes. Psych:  Alert and cooperative. Normal mood and  affect.   Labs/Studies   Recent Labs Recent Labs    10/11/24 0030 10/11/24 0757  WBC 18.0*  --   HGB 4.7* 8.0*  HCT 17.4* 26.2*  PLT 588*  --    BMET Recent Labs    10/11/24 0030  NA 136  K 4.3  CL 101  CO2 22  GLUCOSE 111*  BUN 11  CREATININE 0.83  CALCIUM 8.6*   LFT Recent Labs    10/11/24 0030  PROT 7.0  ALBUMIN  3.8  AST 17  ALT 9  ALKPHOS 106  BILITOT 0.2   PT/INR Recent Labs    10/11/24 0030  LABPROT 15.5*  INR 1.2   Radiology/Studies CT ANGIO GI BLEED Result Date: 10/11/2024 EXAM: CTA ABDOMEN AND PELVIS WITH CONTRAST 10/11/2024 02:12:44 AM TECHNIQUE: CTA images of the abdomen and pelvis with intravenous contrast. 100 mL of iohexol  (OMNIPAQUE ) 350 MG/ML injection was administered. Three-dimensional MIP/volume rendered formations were performed. Automated exposure control, iterative reconstruction, and/or weight based adjustment of the mA/kV was utilized to reduce the radiation dose to as low as reasonably achievable. COMPARISON: None available. CLINICAL HISTORY: Duodenal ulcer FINDINGS: VASCULATURE: GI BLEED: There is intraluminal contrast within the distal esophagus and gastric cardia seen only on venous phase images which may reflect venous hemorrhage, as could be seen with variceal hemorrhage, or gastroesophageal varices within this region. No other foci of gastrointestinal hemorrhage are identified. AORTA: Mild atherosclerotic calcification within the abdominal aorta. No aneurysm or dissection. CELIAC TRUNK: No acute finding. No occlusion or significant stenosis. SUPERIOR MESENTERIC ARTERY: No acute finding. No occlusion or significant stenosis. INFERIOR MESENTERIC ARTERY: No acute finding. No occlusion or significant stenosis. RENAL ARTERIES: No acute finding. No occlusion or significant stenosis. ILIAC ARTERIES: No acute finding. No occlusion or significant stenosis. ABDOMEN/PELVIS: LOWER CHEST: Visualized portion of the lower chest demonstrates no acute  abnormality. LIVER: The liver is unremarkable. GALLBLADDER AND BILE DUCTS: Gallbladder is unremarkable. No biliary ductal dilatation. SPLEEN: The spleen is unremarkable. PANCREAS: The pancreas is unremarkable. ADRENAL GLANDS: Bilateral adrenal glands demonstrate no acute abnormality. KIDNEYS, URETERS AND BLADDER: There is circumferential thickening of the urinary bladder wall, which may relate to changes of chronic bladder outlet obstruction. Marked prostatic hypertrophy. No stones in the kidneys or ureters. No hydronephrosis. No perinephric or periureteral stranding. GI AND BOWEL: There is thickening of the distal esophagus, which is nonspecific but may relate to underlying esophagitis, such as reflux esophagitis. This could be  better assessed with endoscopy if indicated. There is intraluminal contrast within the distal esophagus and gastric cardia seen only on venous phase images which may reflect venous hemorrhage, as could be seen with variceal hemorrhage, or gastroesophageal varices within this region. Moderate stool burden. The stomach, small bowel, and large bowel are otherwise unremarkable. The appendix is not clearly identified on this examination; however, no secondary signs of appendicitis are seen within the right lower quadrant. There is no bowel obstruction. No abnormal bowel wall thickening or distension. REPRODUCTIVE: Marked prostatic hypertrophy. PERITONEUM AND RETROPERITONEUM: No ascites or free air. LYMPH NODES: No lymphadenopathy. BONES AND SOFT TISSUES: Osseous structures are age-appropriate. No acute bone abnormality. No lytic or blastic bone lesion. No acute soft tissue abnormality. IMPRESSION: 1. Intraluminal contrast within the distal esophagus and gastric cardia on venous phase images, possibly reflecting venous hemorrhage or gastroesophageal varices. Correlation with endoscopy would be helpful for further evaluation. 2. Thickening of the distal esophagus, nonspecific and possibly related  to esophagitis. 3. More prostatic hypertrophy. Probable secondary changes of chronic bladder outlet obstruction. Electronically signed by: Dorethia Molt MD 10/11/2024 02:43 AM EST RP Workstation: [id number]   DG Chest Port 1 View Result Date: 10/11/2024 EXAM: 1 VIEW(S) XRAY OF THE CHEST 10/11/2024 12:21:00 AM COMPARISON: 08/18/2023 CLINICAL HISTORY: cp hematemesis FINDINGS: LUNGS AND PLEURA: Interval development of right lateral mid lung zone pleural thickening adjacent to the right fifth rib which may be posttraumatic in nature. No focal pulmonary opacity. No pleural effusion. No pneumothorax. HEART AND MEDIASTINUM: No acute abnormality of the cardiac and mediastinal silhouettes. BONES AND SOFT TISSUES: Healed right posterior seventh rib fracture. Thoracic spondylosis. IMPRESSION: 1. Interval development of right lateral mid lung zone pleural thickening adjacent to the right fifth rib, possibly posttraumatic. Correlation with clinical examination is recommended. If indicated, this could be further assessed with short-term follow-up chest radiograph in 4-6 weeks or follow-up chest CT examination. Electronically signed by: Dorethia Molt MD 10/11/2024 12:29 AM EST RP Workstation: [id number]     Assessment   MATTHEO SWINDLE is a 58 y.o. year old male with history of PE on eliquis  who presented to the ED last night with acute onset of abdominal pain, nausea/vomiting with hematemesis. Hgb 4.7 on arrival, GI bleed scan concerning for possible gastroesophageal varices/venous hemorrhage within distal esophagus/gastric cardia. GI consulted for further evaluation  Hematemesis, anemia: -Acute onset nausea vomiting, epigastric pain last night -Vomiting transitioned to hematemesis after few episodes -Hemoglobin 4.7 on arrival, up to 8 status post 3 units packed red blood cells, 4th unit transfusing -ferritin 25, iron 227, saturation 52 (elevations likely secondary to ETOH abuse) -GI bleed scan concerning for  gastroesophageal varices -Does have history of EtOH abuse though noncirrhotic appearing liver on imaging/normal spleen -denies any GI bleeding history, no history of known liver disease per patient -Platelet count actually appears chronically elevated -Patient is on Eliquis  for history of PE, last dose 5-6 pm on 12/2 -taking daily NSAIDs -Kcentra given, INR 1.2   Patient was started on octreotide and Rocephin due to concern for possible variceal bleed, however noncirrhotic appearing liver on imaging, spleen normal size, platelet count appears chronically elevated, he has been hypertensive throughout admission. Taking frequent NSAIDs and on eliquis . Bleeding could very well be variceal, mallory weiss tear, PUD.  Low suspicion for variceal bleed though especially given significant anemia and concerns for gastroesophageal varices on imaging with ETOH abuse history, would recommend continuing Rocephin for SBP prophylaxis and octreotide and proceeding with EGD (under anesthesia/intubated) later  today for further evaluation. recommend dose of erythromycin 30 minutes prior to upper endoscopy which I discussed with his RN.  4th unit of blood transfusing currently with 2 on standby in blood bank.   Indications, risks and benefits of procedure discussed in detail with patient. Patient verbalized understanding and is in agreement to proceed with EGD.   Plan / Recommendations   -remain NPO for EGD later today -continue with transfusion of 4th unit of blood -IV erythromycin 250mg  --30 minutes prior to EGD -Monitor for overt GI bleeding -Rocephin1 g Q24hr SBP prohpylaxis -Continue octreotide x72 hours  -PPI BID -hold ELIQUIS  -CIWA protocol per hospitalist -thiamine and folic acid supplementation daily      10/11/2024, 8:50 AM  Burhan Barham L. Mychele Seyller, MSN, APRN, AGNP-C Adult-Gerontology Nurse Practitioner [facility] at [facility]

## 2024-10-11 NOTE — H&P (Incomplete)
 NAME:  Alan [name], MRN:  [id number], DOB:  01/26/1966, LOS: 0 ADMISSION DATE:  10/10/2024, CONSULTATION DATE:  10/11/2024 REFERRING MD:  Dr. Ricky, CHIEF COMPLAINT:  hematemesis  History of Present Illness:  Patient is intubated and sedated history is obtained via chart review  Alan [name] is a 57 year old male with history of Etoh abuse, COPD, pulmonary hypertension, and PE (on Eliquis ) who presented to [facility] with hematemesis   Pertinent  Medical History  Etoh abuse PE (On Eliquis )  Significant Hospital Events: Including procedures, antibiotic start and stop dates in addition to other pertinent events     Interim History / Subjective:  ***  Objective    Blood pressure 113/84, pulse 83, temperature 98 F (36.7 C), temperature source Axillary, resp. rate 18, height 5' 9 (1.753 m), weight 51.1 kg, SpO2 100%.    Vent Mode: PRVC FiO2 (%):  [40 %-100 %] 40 % Set Rate:  [18 bmp] 18 bmp Vt Set:  [560 mL] 560 mL PEEP:  [5 cmH20] 5 cmH20 Plateau Pressure:  [15 cmH20] 15 cmH20   Intake/Output Summary (Last 24 hours) at 10/11/2024 1841 Last data filed at 10/11/2024 1826 Gross per 24 hour  Intake 2865.62 ml  Output 1675 ml  Net 1190.62 ml   Filed Weights   10/10/24 2347 10/11/24 0500  Weight: 56.7 kg 51.1 kg    Examination: General: *** HENT: *** Lungs: *** Cardiovascular: *** Abdomen: *** Extremities: *** Neuro: *** GU: ***  Resolved problem list   Assessment and Plan   History of pulmonary hypertension Follows with cardiology outpatient. - Holding home Adempas  with concern for   History of COPD On *** at home.  - Triple therapy while intubated, PRN duo-nebs  Labs   CBC: Recent Labs  Lab 10/11/24 0030 10/11/24 0757 10/11/24 1708  WBC 18.0*  --   --   HGB 4.7* 8.0* 9.5*  HCT 17.4* 26.2* 30.6*  MCV 73.7*  --   --   PLT 588*  --   --     Basic Metabolic Panel: Recent Labs  Lab 10/11/24 0030 10/11/24 0757  NA 136  --   K 4.3  --    CL 101  --   CO2 22  --   GLUCOSE 111*  --   BUN 11  --   CREATININE 0.83  --   CALCIUM 8.6*  --   MG  --  2.4  PHOS  --  3.4   GFR: Estimated Creatinine Clearance: 70.1 mL/min (by C-G formula based on SCr of 0.83 mg/dL). Recent Labs  Lab 10/11/24 0030  WBC 18.0*    Liver Function Tests: Recent Labs  Lab 10/11/24 0030  AST 17  ALT 9  ALKPHOS 106  BILITOT 0.2  PROT 7.0  ALBUMIN  3.8   Recent Labs  Lab 10/11/24 0030  LIPASE 17   No results for input(s): AMMONIA in the last 168 hours.  ABG    Component Value Date/Time   HCO3 27.5 04/06/2024 1011   TCO2 29 04/06/2024 1011   O2SAT 64 04/06/2024 1011     Coagulation Profile: Recent Labs  Lab 10/11/24 0030  INR 1.2    Cardiac Enzymes: No results for input(s): CKTOTAL, CKMB, CKMBINDEX, TROPONINI in the last 168 hours.  HbA1C: No results found for: HGBA1C  CBG: Recent Labs  Lab 10/11/24 1536  GLUCAP 137*    Review of Systems:   ***  Past Medical History:  He,  has a  past medical history of Anemia, COPD (chronic obstructive pulmonary disease) (HCC), Right knee pain, and Right shoulder pain.   Surgical History:   Past Surgical History:  Procedure Laterality Date   PULMONARY ANGIOGRAPHY N/A 04/06/2024   Procedure: PULMONARY ANGIOGRAPHY;  Surgeon: Zenaida Morene PARAS, MD;  Location: [facility] INVASIVE CV LAB;  Service: Cardiovascular;  Laterality: N/A;   RIGHT HEART CATH N/A 10/13/2023   Procedure: RIGHT HEART CATH;  Surgeon: Anner Alm ORN, MD;  Location: [facility] INVASIVE CV LAB;  Service: Cardiovascular;  Laterality: N/A;   RIGHT HEART CATH N/A 04/06/2024   Procedure: RIGHT HEART CATH;  Surgeon: Zenaida Morene PARAS, MD;  Location: [facility] INVASIVE CV LAB;  Service: Cardiovascular;  Laterality: N/A;   TONSILLECTOMY  58 yo     Social History:   reports that he has been smoking cigarettes. He started smoking about 43 years ago. He has a 65 pack-year smoking history. He has never used smokeless tobacco. He  reports current alcohol use. He reports current drug use. Drug: Marijuana.   Family History:  His family history includes Asthma in his brother; Diabetes in his father and mother; Heart disease in his mother.   Allergies No Known Allergies   Home Medications  Prior to Admission medications   Medication Sig Start Date End Date Taking? Authorizing Provider  ADEMPAS  2.5 MG TABS Take 1 tablet by mouth 3 (three) times daily. 09/21/24  Yes Zenaida Morene PARAS, MD  albuterol  (PROAIR  HFA) 108 [phone] Base) MCG/ACT inhaler 2 puffs every 4 hours as needed only  if your can't catch your breath 11/08/23  Yes Darlean Ozell NOVAK, MD  apixaban  (ELIQUIS ) 5 MG TABS tablet Take 1 tablet (5 mg total) by mouth 2 (two) times daily. 09/26/24  Yes Zenaida Morene PARAS, MD  Aspirin -Caffeine (BAYER BACK & BODY PO) Take 1 tablet by mouth daily as needed (pain).   Yes [provider]  famotidine-calcium carbonate-magnesium  hydroxide (PEPCID COMPLETE) 10-800-165 MG chewable tablet Chew 1 tablet by mouth daily as needed (acid reflux).   Yes [provider]  mometasone -formoterol  (DULERA ) 100-5 MCG/ACT AERO Inhale 2 puffs into the lungs 2 (two) times daily. 01/17/24  Yes Milian, Marry Lenis, FNP  Riociguat  (ADEMPAS ) 1 MG TABS Take 1 mg by mouth in the morning, at noon, and at bedtime. 08/25/24  Yes Zenaida Morene PARAS, MD  tiotropium (SPIRIVA ) 18 MCG inhalation capsule Place 1 capsule (18 mcg total) into inhaler and inhale daily. 05/10/24  Yes Darlean Ozell NOVAK, MD  torsemide  (DEMADEX ) 20 MG tablet Take 10 mg by mouth every other day.   Yes [provider]  aspirin -sod bicarb-citric acid (ALKA-SELTZER) 325 MG TBEF tablet Take 650 mg by mouth every 6 (six) hours as needed (indigestion).    [provider]  Dihydroxyaluminum Sod Carb (ROLAIDS PO) Take 1 tablet by mouth daily as needed (acid reflux).    [provider]     Critical care time: ***

## 2024-10-11 NOTE — Plan of Care (Signed)
  Problem: Education: Goal: Knowledge of General Education information will improve Description: Including pain rating scale, medication(s)/side effects and non-pharmacologic comfort measures Outcome: Progressing   Problem: Health Behavior/Discharge Planning: Goal: Ability to manage health-related needs will improve Outcome: Progressing   Problem: Clinical Measurements: Goal: Ability to maintain clinical measurements within normal limits will improve Outcome: Progressing   Problem: Pain Managment: Goal: General experience of comfort will improve and/or be controlled Outcome: Progressing   Problem: Safety: Goal: Ability to remain free from injury will improve Outcome: Progressing

## 2024-10-11 NOTE — Anesthesia Procedure Notes (Signed)
 Procedure Name: Intubation Date/Time: 10/11/2024 2:05 PM  Performed by: Pheobe Adine CROME, CRNAPre-anesthesia Checklist: Patient identified, Emergency Drugs available, Suction available, Patient being monitored and Timeout performed Patient Re-evaluated:Patient Re-evaluated prior to induction Oxygen Delivery Method: Circle system utilized Preoxygenation: Pre-oxygenation with 100% oxygen Induction Type: IV induction Laryngoscope Size: Miller and 2 Grade View: Grade I Tube type: Oral Tube size: 7.5 mm Number of attempts: 1 Airway Equipment and Method: Stylet Placement Confirmation: ETT inserted through vocal cords under direct vision, positive ETCO2, CO2 detector and breath sounds checked- equal and bilateral Secured at: 23 cm Tube secured with: Tape Dental Injury: Teeth and Oropharynx as per pre-operative assessment

## 2024-10-11 NOTE — H&P (Signed)
 NAME:  Alan [name], MRN:  [id number], DOB:  31-Dec-1965, LOS: 0 ADMISSION DATE:  10/10/2024 CONSULTATION DATE:  10/11/2024 REFERRING MD:  Ricky - TRH CHIEF COMPLAINT:  Hematemesis   History of Present Illness:  58 year old man who presented to [facility] 12/3 as a transfer from [facility] for hematemesis. PMHx significant for CTEPH, PE (on Eliquis ), COPD, anemia, EtOH use, tobacco use.   Patient initially presented to [facility] 12/2PM for nausea, vomiting, acid reflux and abdominal pain after eating seafood at a restaurant. Progressed to bloody emesis after several episodes of vomiting with associated chest discomfort. On ED presentation, patient was afebrile with HR 107, BP 108/72, RR 15, SpO2 99% on RA. Labs were notable for WBC 18, Hgb 4.7, Plt 588. INR 1.2. Na 136, K 4.3., CO2 22, Cr 0.83. LFTs and lipase WNL. Trop 33 > 20. Iron studies unremarkable. Repeat Hgb 8.0 after 3U PRBCs. CXR demonstrated R lateral midlung pleural thickening. CTA GIB Protocol with intraluminal contrast noted in the distal esophagus/gastric cardia, ?venous hemorrhage vs. GE varices, findings c/f esophagitis. Kcentra administered for reversal, given life-threatening bleeding. Ceftriaxone, octreotide gtt, Protonix and Zofran  administered. GI was urgently consulted and patient was intubated for EGD. EGD findings included severe Mallory-Weiss tear with significant hemorrhage; Hemospray was applied. CT Chest demonstrated high-density material c/w hemospray during EGD, no evidence of full-thickness perf, +emphysema/airway thickening with plugging of bilateral lower lobe bronchi, small bilateral pleural effusions. Broad-spectrum antibiotics started for ?mediastinitis.   Patient was subsequently transferred to [facility] for further evaluation (including TCTS) and management. PCCM consulted for ICU admission.  Pertinent Medical History:   Past Medical History:  Diagnosis Date   Anemia    COPD (chronic obstructive pulmonary disease) (HCC)    CTEPH  (chronic thromboembolic pulmonary hypertension) (HCC)    History of ETOH abuse    Pulmonary embolism (HCC)    On Eliquis    Pulmonary hypertension (HCC)    Right knee pain    Right shoulder pain    Significant Hospital Events: Including procedures, antibiotic start and stop dates in addition to other pertinent events   12/2 - Presented to [facility] with nausea, vomiting, abdominal pain. Multiple episodes of emesis with development of hematemesis. Hgb 4.7, 3U PRBCs transfused. CTA GIB with intraluminal contrast noted in the distal esophagus/gastric cardia, ?venous hemorrhage vs. GE varices.  12/3 - GI consulted. Intubated for EGD; EGD completed, +extensive mucosal tearing (Mallory-Weiss tear) with significant hemorrhage, occlusion with clot, Hemospray applied. CT Chest without evidence of full-thickness perforation. Transferred to [facility] for ICU admission/TCTS and PCCM evaluation and management.  Interim History / Subjective:  PCCM consulted for ICU admission and management.  Objective:  Blood pressure 105/80, pulse 83, temperature 98.6 F (37 C), temperature source Axillary, resp. rate 18, height 5' 9 (1.753 m), weight 51.1 kg, SpO2 100%.    Vent Mode: PRVC FiO2 (%):  [40 %-100 %] 40 % Set Rate:  [18 bmp] 18 bmp Vt Set:  [560 mL] 560 mL PEEP:  [5 cmH20] 5 cmH20 Plateau Pressure:  [15 cmH20-21 cmH20] 21 cmH20   Intake/Output Summary (Last 24 hours) at 10/11/2024 2238 Last data filed at 10/11/2024 1948 Gross per 24 hour  Intake 2865.62 ml  Output 2075 ml  Net 790.62 ml   Filed Weights   10/10/24 2347 10/11/24 0500  Weight: 56.7 kg 51.1 kg   Physical Examination: General: Acutely ill-appearing middle-aged man in NAD. HEENT: [place]/AT, anicteric sclera, pupils equal, round and pinpoint, moist mucous membranes. Scant dried blood around mouth.  Neuro: Intubated, sedated. Grimaces to noxious stimuli. Withdraws in pain in all 4 extremities. Not following commands. +Corneal, +Cough, and +Gag  CV:  RRR, no m/g/r. PULM: Breathing even and unlabored on vent (PEEP 5, FiO2 40%). Lung fields with faint coarse crackles on R, diminished at bilateral bases. GI: Soft, nontender, nondistended. Normoactive bowel sounds. Extremities: No LE edema noted. Skin: Warm/dry, no rashes.  Resolved Hospital Problem List:    Assessment & Plan:  Hemorrhage secondary to Mallory-Weiss tear Esophagitis ABLA EGD 12/2 +extensive mucosal tearing (Mallory-Weiss tear) with significant hemorrhage, occlusion with clot, Hemospray applied. - Admit to ICU at [facility] - Goal MAP > 65 - Volume/blood product resuscitation as tolerated - Trend H&H - Monitor for signs of active bleeding - Transfuse for Hgb < 7.0 or hemodynamically significant bleeding; s/p 4U PRBCs - Appreciate GI recommendations - Continue octreotide gtt, PPI for now - Avoid gastric tube placement - TCTS evaluation 12/4AM  ?Mediastinitis - Trend WBC, fever curve - Continue broad-spectrum antibiotics (ceftriaxone, erythromycin, Flagyl) - Narrow abx as able pending TCTS plan  PE (on Eliquis ) S/p Kcentra administration. - Hold Eliquis  given active bleeding - Consider repeat Echo, non-urgent  Acute respiratory insufficiency in the setting of hematemesis, EGD COPD CTEPH - Continue full vent support (4-8cc/kg IBW) - Wean FiO2 for O2 sat > 90% - Daily WUA/SBT - VAP bundle - Bronchodilators as ordered (triple therapy + PRN) - Pulmonary hygiene - PAD protocol for sedation: Propofol and Fentanyl for goal RASS -2 to -3 - Follow CXR - Hold Adempas /diuretics for now, resume as clinically appropriate  EtOH use Tobacco use - Monitor for signs/symptoms of withdrawal - PAD protocol while intubated, CIWA once extubated - Continue thiamine/folate (start date 12/5) - MV when able to tolerate PO - Encourage cessation; TOC consult as indicated  Patient's friend, [name], updated via phone.  Labs:  CBC: Recent Labs  Lab 10/11/24 0030 10/11/24 0757  10/11/24 1708 10/11/24 2150  WBC 18.0*  --   --   --   HGB 4.7* 8.0* 9.5* 8.8*  HCT 17.4* 26.2* 30.6* 26.0*  MCV 73.7*  --   --   --   PLT 588*  --   --   --    Basic Metabolic Panel: Recent Labs  Lab 10/11/24 0030 10/11/24 0757 10/11/24 2150  NA 136  --  136  K 4.3  --  3.6  CL 101  --   --   CO2 22  --   --   GLUCOSE 111*  --   --   BUN 11  --   --   CREATININE 0.83  --   --   CALCIUM 8.6*  --   --   MG  --  2.4  --   PHOS  --  3.4  --    GFR: Estimated Creatinine Clearance: 70.1 mL/min (by C-G formula based on SCr of 0.83 mg/dL). Recent Labs  Lab 10/11/24 0030  WBC 18.0*   Liver Function Tests: Recent Labs  Lab 10/11/24 0030  AST 17  ALT 9  ALKPHOS 106  BILITOT 0.2  PROT 7.0  ALBUMIN  3.8   Recent Labs  Lab 10/11/24 0030  LIPASE 17   No results for input(s): AMMONIA in the last 168 hours.  ABG:    Component Value Date/Time   PHART 7.497 (H) 10/11/2024 2150   PCO2ART 33.8 10/11/2024 2150   PO2ART 122 (H) 10/11/2024 2150   HCO3 26.1 10/11/2024 2150   TCO2 27 10/11/2024 2150  O2SAT 99 10/11/2024 2150    Coagulation Profile: Recent Labs  Lab 10/11/24 0030  INR 1.2   Cardiac Enzymes: No results for input(s): CKTOTAL, CKMB, CKMBINDEX, TROPONINI in the last 168 hours.  HbA1C: No results found for: HGBA1C  CBG: Recent Labs  Lab 10/11/24 1536 10/11/24 1917 10/11/24 2055  GLUCAP 137* 150* 141*   Review of Systems:   Patient is encephalopathic and/or intubated; therefore, history has been obtained from chart review.   Past Medical History:  He,  has a past medical history of Anemia, COPD (chronic obstructive pulmonary disease) (HCC), CTEPH (chronic thromboembolic pulmonary hypertension) (HCC), History of ETOH abuse, Pulmonary embolism (HCC), Pulmonary hypertension (HCC), Right knee pain, and Right shoulder pain.   Surgical History:   Past Surgical History:  Procedure Laterality Date   PULMONARY ANGIOGRAPHY N/A 04/06/2024    Procedure: PULMONARY ANGIOGRAPHY;  Surgeon: Zenaida Morene PARAS, MD;  Location: [facility] INVASIVE CV LAB;  Service: Cardiovascular;  Laterality: N/A;   RIGHT HEART CATH N/A 10/13/2023   Procedure: RIGHT HEART CATH;  Surgeon: Anner Alm ORN, MD;  Location[facility] INVASIVE CV LAB;  Service: Cardiovascular;  Laterality: N/A;   RIGHT HEART CATH N/A 04/06/2024   Procedure: RIGHT HEART CATH;  Surgeon: Zenaida Morene PARAS, MD;  Location: [facility] INVASIVE CV LAB;  Service: Cardiovascular;  Laterality: N/A;   TONSILLECTOMY  58 yo    Social History:   reports that he has been smoking cigarettes. He started smoking about 43 years ago. He has a 65 pack-year smoking history. He has never used smokeless tobacco. He reports current alcohol use. He reports current drug use. Drug: Marijuana.   Family History:  His family history includes Asthma in his brother; Diabetes in his father and mother; Heart disease in his mother.   Allergies: No Known Allergies   Home Medications: Prior to Admission medications   Medication Sig Start Date End Date Taking? Authorizing Provider  ADEMPAS  2.5 MG TABS Take 1 tablet by mouth 3 (three) times daily. 09/21/24  Yes Zenaida Morene PARAS, MD  albuterol  (PROAIR  HFA) 108 [phone] Base) MCG/ACT inhaler 2 puffs every 4 hours as needed only  if your can't catch your breath 11/08/23  Yes Darlean Ozell NOVAK, MD  apixaban  (ELIQUIS ) 5 MG TABS tablet Take 1 tablet (5 mg total) by mouth 2 (two) times daily. 09/26/24  Yes Zenaida Morene PARAS, MD  Aspirin -Caffeine (BAYER BACK & BODY PO) Take 1 tablet by mouth daily as needed (pain).   Yes [provider]  famotidine-calcium carbonate-magnesium  hydroxide (PEPCID COMPLETE) 10-800-165 MG chewable tablet Chew 1 tablet by mouth daily as needed (acid reflux).   Yes [provider]  mometasone -formoterol  (DULERA ) 100-5 MCG/ACT AERO Inhale 2 puffs into the lungs 2 (two) times daily. 01/17/24  Yes Milian, Marry Lenis, FNP  Riociguat  (ADEMPAS ) 1 MG TABS  Take 1 mg by mouth in the morning, at noon, and at bedtime. 08/25/24  Yes Zenaida Morene PARAS, MD  tiotropium (SPIRIVA ) 18 MCG inhalation capsule Place 1 capsule (18 mcg total) into inhaler and inhale daily. 05/10/24  Yes Darlean Ozell NOVAK, MD  torsemide  (DEMADEX ) 20 MG tablet Take 10 mg by mouth every other day.   Yes [provider]  aspirin -sod bicarb-citric acid (ALKA-SELTZER) 325 MG TBEF tablet Take 650 mg by mouth every 6 (six) hours as needed (indigestion).    [provider]  Dihydroxyaluminum Sod Carb (ROLAIDS PO) Take 1 tablet by mouth daily as needed (acid reflux).    [provider]  Critical care time:   The patient is critically ill with multiple organ system failure and requires high complexity decision making for assessment and support, frequent evaluation and titration of therapies, advanced monitoring, review of radiographic studies and interpretation of complex data.   Critical Care Time devoted to patient care services, exclusive of separately billable procedures, described in this note is 41 minutes.  Corean CHRISTELLA Akima Slaugh, PA-C [facility] 10/11/24 10:38 PM  Please see [email or web address] for pager details.  From 7A-7P if no response, please call [phone] After hours, please call [facility] [phone]

## 2024-10-11 NOTE — Progress Notes (Signed)
 Patient arrived back to unit from Endo. He is still on the ventilator and had propofol at 50 mcg/kg/min and octrotide at 25 mL/hr. Paitent RASS score is -5. Bladder scan 239. Notified Dr. Ricky for orders. See new orders.     10/11/24 1530  Vitals  Temp 98 F (36.7 C)  Temp Source Axillary  BP 122/89  MAP (mmHg) 99  Pulse Rate 90  ECG Heart Rate 89  Resp 18  MEWS COLOR  MEWS Score Color Yellow  Oxygen Therapy  SpO2 100 %  MEWS Score  MEWS Temp 0  MEWS Systolic 0  MEWS Pulse 0  MEWS RR 0  MEWS LOC 3  MEWS Score 3

## 2024-10-11 NOTE — Progress Notes (Signed)
   10/11/24 0958  TOC Brief Assessment  Insurance and Status Reviewed  Patient has primary care physician Yes  Home environment has been reviewed Home Alone  Prior level of function: Independent  Prior/Current Home Services No current home services  Social Drivers of Health Review SDOH reviewed no interventions necessary  Readmission risk has been reviewed Yes  Transition of care needs no transition of care needs at this time   Substance abuse resources added. Getting Blood, IPCM following.  Inpatient Care Manager [facility]) has reviewed patient and no ICM needs have been identified at this time. We will continue to monitor patient advancement through interdisciplinary progression rounds. If new patient transition needs arise, please place a ICM consult.

## 2024-10-11 NOTE — Progress Notes (Addendum)
 Patient seen and examined; admitted after midnight secondary to acute blood loss anemia in the setting of hematemesis/upper GI bleed.  Patient reports no shortness of breath, is afebrile and demonstrating a stable blood pressure and for the most part heart rate.  2 L nasal cannula in place for comfort.  Patient reporting midepigastric discomfort.  At time of admission hemoglobin 4.7 and after 3 units PRBCs hemoglobin up to 8.0.  Please refer to H&P written by Dr. Manfred on 10/11/2024 for further info/details.  Physical exam:  General exam: Alert, awake, oriented x 3; following commands appropriately.  Demonstrating some bloody speeds and intermittent coughing with some red phlegm. Respiratory system: No wheezing or crackles appreciated on exam.  Positive scattered rhonchi. Cardiovascular system:RRR.  No rubs, no gallops, no JVD. Gastrointestinal system: Abdomen is nondistended, soft and slightly tender in midepigastric area as per patient report.  Positive bowel sounds appreciated.  No guarding. Central nervous system: Moving 4 limbs spontaneously.  No focal neurological deficits. Extremities: No cyanosis or clubbing. Skin: No petechiae. Psychiatry: Judgement and insight appear normal. Mood & affect appropriate.   Plan of care: -Continue IV PPI and octreotide - Continue to follow hemoglobin trend and further transfuse as needed - Continue as needed analgesics, antiemetics and supportive care - GI service has been consulted and plan is for endoscopy evaluation later today. - Continue to follow recommendations. - Patient with underlying history of alcohol; CIWA protocol in place.  Currently no acute withdrawal symptoms appreciated.  Addendum: 6:11 pm Status post endoscopic evaluation demonstrating Severe Mallory-Weiss tear with significant hemorrhage; Hemospray has been applied in order to slow down the bleed.  Patient has remained intubated.  After discussing with critical care service and  following results of CT chest (showing abnormal esophagus dilated with a central frothy intraluminal content); patient will be transferred to [facility] ICU level for further evaluation and management.  IV antibiotics empirically for mediastinitis has been started.  Eric Nunnery MD [phone]

## 2024-10-11 NOTE — Progress Notes (Addendum)
 58 year old male with history of EtOH abuse, PE-on Eliquis , who presents with hematemesis at [facility].  Patient is in ICU and Zelda Salmon Patient had to be intubated for EGD. EGD done showed extensive mucosal tearing of the esophagus, with a high concern for perforation of the distal esophagus.  Antibiotic was empirically started for potential mediastinitis. CT shows abnormal esophagus dilated with a central frothy intraluminal content. Patient will be transferred to [facility] for further management including thoracic surgery evaluation. Called [facility] and accepted the patient at 5:40 PM of 10/11/2024    Lenny Drought, MD  [facility] Prefer epic messenger for cross cover needs

## 2024-10-11 NOTE — Op Note (Signed)
 [facility] Patient Name: Alan [name] Procedure Date: 10/11/2024 1:40 PM MRN: [id number] Date of Birth: 10-06-1966 Attending [name]: Alan [name] , [name], [id number] CSN: [id number] Age: 58 Admit Type: Outpatient Procedure:                Upper GI endoscopy Indications:              Hematemesis; severe retching followed by hematemesis Providers:                Alan Ozell Hollingshead, [name], Madelin Hunter, RN, Bascom Blush Referring [name]:              Medicines:                Propofol per Anesthesia Complications:            No immediate complications. Estimated Blood Loss:     Estimated blood loss: none. Procedure:                Pre-Anesthesia Assessment:                           - Prior to the procedure, a History and Physical                            was performed, and patient medications and                            allergies were reviewed. The patient's tolerance of                            previous anesthesia was also reviewed. The risks                            and benefits of the procedure and the sedation                            options and risks were discussed with the patient.                            All questions were answered, and informed consent                            was obtained. Prior Anticoagulants: The patient                            last took Eliquis  (apixaban ) 1 day prior to the                            procedure. ASA Grade Assessment: IV - A patient                            with severe systemic disease that is a constant  threat to life. After reviewing the risks and                            benefits, the patient was deemed in satisfactory                            condition to undergo the procedure.                           After obtaining informed consent, the endoscope was                            passed under direct vision. Throughout the                             procedure, the patient's blood pressure, pulse, and                            oxygen saturations were monitored continuously. The                            [id number] ([id number]) Upper was introduced through                            the mouth, and advanced to the second part of                            duodenum. The upper GI endoscopy was accomplished                            without difficulty. The patient tolerated the                            procedure well. Scope In: 2:10:57 PM Scope Out: 2:23:56 PM Total Procedure Duration: 0 hours 12 minutes 59 seconds  Findings:      Extensive clot occupying the entire esophageal nearly the entire       esophageal body. Appears to have an extensive longitudinal tear in the       esophagus approximately a third involving approximately one third of the       circumference mid level through the distal aspect. Limited views because       of large amount of clot. I question a through and through perforation       just above the GE junction. GE junction easily traversed with the scope.      Blood-tinged gastric mucosa. Small hiatal hernia. No obvious ulcer or       other abnormality in the stomach. Pylorus patent. Examination the bulb       second portion revealed no abnormalities. Attention turned back to the       esophagus where I pulled up through the esophagus again, concern for       Boerhaave syndrome more distally. With extensive more superficial       appearing tear more proximally. This area was treated with Hemospray.      I did not see varices or portal hypertension. Hypertensive gastropathy.  Impression:               - Extensive mucosal tearing of the esophagus;                            tubular esophagus occluded with clot. There is                            concern for perforation in the distal esophagus.                           - Extensive superficial tearing of the mid                            esophageal body with oozing  treated with Hemospray.                           - Small hiatal hernia. No obvious gastric or                            duodenal bleeding.                           - Scenario most consistent with Boerhaave syndrome Moderate Sedation:      Moderate (conscious) sedation was personally administered by an       anesthesia professional. The following parameters were monitored: oxygen       saturation, heart rate, blood pressure, respiratory rate, EKG, adequacy       of pulmonary ventilation, and response to care. Recommendation:           - Return patient to ICU for ongoing care. Keep NPO.                            Keep intubated. Consider starting antibiotics for                            potential mediastinitis. Stat chest CT to further                            evaluate for esophageal perforation. Patient may be                            best served by transfer to a tertiary referral                            center. At patient's request, I called his friend                            [name] at [phone]. I updated her with my                            findings and recommendations. Her questions were                            answered. Procedure  Code(s):        --- Professional ---                           [phone], Esophagogastroduodenoscopy, flexible,                            transoral; diagnostic, including collection of                            specimen(s) by brushing or washing, when performed                            (separate procedure) Diagnosis Code(s):        --- Professional ---                           K92.0, Hematemesis CPT copyright 2022 American Medical Association. All rights reserved. The codes documented in this report are preliminary and upon coder review may  be revised to meet current compliance requirements. Alan [name] Alan Ozell Hollingshead, [name] 10/11/2024 2:51:43 PM This report has been signed electronically. Number of Addenda: 0

## 2024-10-11 NOTE — Anesthesia Preprocedure Evaluation (Addendum)
 Anesthesia Evaluation  Patient identified by MRN, date of birth, ID band Patient awake    Reviewed: Allergy & Precautions, H&P , NPO status , Patient's Chart, lab work & pertinent test results  Airway Mallampati: II  TM Distance: >3 FB Neck ROM: Full    Dental  (+) Edentulous Lower, Edentulous Upper   Pulmonary COPD, Current Smoker and Patient abstained from smoking.   Pulmonary exam normal breath sounds clear to auscultation       Cardiovascular + DOE  Normal cardiovascular exam Rhythm:Regular Rate:Normal     Neuro/Psych negative neurological ROS  negative psych ROS   GI/Hepatic negative GI ROS, Neg liver ROS,,,  Endo/Other  negative endocrine ROS    Renal/GU negative Renal ROS  negative genitourinary   Musculoskeletal negative musculoskeletal ROS (+)    Abdominal   Peds negative pediatric ROS (+)  Hematology  (+) Blood dyscrasia, anemia Hgb up from 4.7 to 8   Anesthesia Other Findings   Reproductive/Obstetrics negative OB ROS                              Anesthesia Physical Anesthesia Plan  ASA: 4 and emergent  Anesthesia Plan: General   Post-op Pain Management:    Induction: Intravenous  PONV Risk Score and Plan:   Airway Management Planned: Nasal Cannula  Additional Equipment:   Intra-op Plan:   Post-operative Plan:   Informed Consent: I have reviewed the patients History and Physical, chart, labs and discussed the procedure including the risks, benefits and alternatives for the proposed anesthesia with the patient or authorized representative who has indicated his/her understanding and acceptance.     Dental advisory given  Plan Discussed with: CRNA  Anesthesia Plan Comments:          Anesthesia Quick Evaluation

## 2024-10-11 NOTE — H&P (Addendum)
 History and Physical    Patient: Alan [name] FMW:[id number] DOB: 10-03-1966 DOA: 10/10/2024 DOS: the patient was seen and examined on 10/11/2024 PCP: Cathlene Marry Lenis, FNP  Patient coming from: Home  Chief Complaint:  Chief Complaint  Patient presents with   Emesis   HPI: Alan [name] is a 58 y.o. male with medical history significant of PE on Eliquis , alcohol abuse who presents to the emergency department due to vomiting.  Patient states that shortly after eating seafood at a restaurant, he developed nausea and several episodes of vomiting which initially was nonbloody, but became bloody after a few episodes and was associated with abdominal discomfort with burning sensation in his mid chest area.  This happened about 1 hour PTA.  ED course In the emergency department, he was tachycardic, temperature was 37.5 on arrival, but is improved to 97.9 F, other vital signs were within normal range and supplemental breathing via Jerome at 2 LPM was given for comfort.  Workup in ED showed WBC 18, hemoglobin 4.7, hematocrit 17.4, MCV 73.7, platelets 588.  BMP was normal except for blood glucose of 111 and calcium of 8.6. CT angiography of abdomen and pelvis with contrast showed possibly reflecting venous hemorrhage or gastroesophageal varices.  IV ceftriaxone was given, morphine, octreotide, Zofran  and Protonix were given.  Kcentra and IV hydration were provided. Gastroenterologist (Dr. Eartha) was consulted and will see patient in the morning.    Review of Systems: As mentioned in the history of present illness. All other systems reviewed and are negative. Past Medical History:  Diagnosis Date   Right knee pain    Right shoulder pain    Past Surgical History:  Procedure Laterality Date   PULMONARY ANGIOGRAPHY N/A 04/06/2024   Procedure: PULMONARY ANGIOGRAPHY;  Surgeon: Zenaida Morene PARAS, MD;  Location: [facility] INVASIVE CV LAB;  Service: Cardiovascular;  Laterality: N/A;   RIGHT HEART  CATH N/A 10/13/2023   Procedure: RIGHT HEART CATH;  Surgeon: Anner Alm ORN, MD;  Location: [facility] INVASIVE CV LAB;  Service: Cardiovascular;  Laterality: N/A;   RIGHT HEART CATH N/A 04/06/2024   Procedure: RIGHT HEART CATH;  Surgeon: Zenaida Morene PARAS, MD;  Location: [facility] INVASIVE CV LAB;  Service: Cardiovascular;  Laterality: N/A;   TONSILLECTOMY  58 yo   Social History:  reports that he has been smoking cigarettes. He started smoking about 43 years ago. He has a 65 pack-year smoking history. He has never used smokeless tobacco. He reports current alcohol use. He reports current drug use. Drug: Marijuana.  No Known Allergies  Family History  Problem Relation Age of Onset   Heart disease Mother    Diabetes Mother    Diabetes Father    Asthma Brother     Prior to Admission medications   Medication Sig Start Date End Date Taking? Authorizing Provider  ADEMPAS  2.5 MG TABS Take 1 tablet by mouth 3 (three) times daily. 09/21/24   Zenaida Morene PARAS, MD  albuterol  (PROAIR  HFA) [phone] Base) MCG/ACT inhaler 2 puffs every 4 hours as needed only  if your can't catch your breath 11/08/23   Darlean Ozell NOVAK, MD  apixaban  (ELIQUIS ) 5 MG TABS tablet Take 1 tablet (5 mg total) by mouth 2 (two) times daily. 09/26/24   Zenaida Morene PARAS, MD  Aspirin -Caffeine (BAYER BACK & BODY PO) Take 1 tablet by mouth daily as needed (pain).    [provider]  aspirin -sod bicarb-citric acid (ALKA-SELTZER) 325 MG TBEF tablet Take 650 mg by mouth  every 6 (six) hours as needed (indigestion).    [provider]  Dihydroxyaluminum Sod Carb (ROLAIDS PO) Take 1 tablet by mouth daily as needed (acid reflux).    [provider]  famotidine-calcium carbonate-magnesium  hydroxide (PEPCID COMPLETE) 10-800-165 MG chewable tablet Chew 1 tablet by mouth daily as needed (acid reflux).    [provider]  mometasone -formoterol  (DULERA ) 100-5 MCG/ACT AERO Inhale 2 puffs into the lungs 2 (two) times daily.  01/17/24   Milian, Marry Lenis, FNP  Riociguat  (ADEMPAS ) 1 MG TABS Take 1 mg by mouth in the morning, at noon, and at bedtime. 08/25/24   Zenaida Morene PARAS, MD  tiotropium (SPIRIVA ) 18 MCG inhalation capsule Place 1 capsule (18 mcg total) into inhaler and inhale daily. 05/10/24   Darlean Ozell NOVAK, MD  torsemide  (DEMADEX ) 20 MG tablet Take 10 mg by mouth every other day.    [provider]    Physical Exam: Vitals:   10/11/24 0210 10/11/24 0215 10/11/24 0220 10/11/24 0227  BP: 131/87 134/84  104/75  Pulse: (!) 105 (!) 101 (!) 102 (!) 112  Resp: 16 17 12  (!) 21  Temp: 98.1 F (36.7 C)   98 F (36.7 C)  TempSrc: Oral   Oral  SpO2:  100% 99% 100%  Weight:      Height:       General:  Awake and alert and oriented x3. Not in any acute distress.  HEENT: NCAT.  PERRLA. EOMI. Sclerae anicteric.  Moist mucosal membranes. Neck: Neck supple without lymphadenopathy. No carotid bruits. No masses palpated.  Cardiovascular: Regular rate with normal S1-S2 sounds. No murmurs, rubs or gallops auscultated. No JVD.  Respiratory: Clear breath sounds.  No accessory muscle use. Abdomen: Soft, tender to palpation in the epigastrium, hematemesis noted at bedside.  Nondistended. Active bowel sounds. No masses or hepatosplenomegaly  Skin: No rashes, lesions, or ulcerations.  Dry, warm to touch. Musculoskeletal:  2+ dorsalis pedis and radial pulses. Good ROM.  No contractures  Psychiatric: Intact judgment and insight.  Mood appropriate to current condition. Neurologic: No focal neurological deficits. Strength is 5/5 x 4.  CN II - XII grossly intact.   Assessment and Plan: Upper GI bleed ABLA H/H= 4.7/17.4, this was 11.2/33.0 on 04/06/2024 Hematemesis noted at bedside Type and crossmatch will be done 3 units of PRBC will be transfused Continue IV Protonix 40 mg twice daily Continue IV morphine 2 mg every 3 hours as needed for moderate and severe pain Continue IV Zofran  4 mg every 6 hours as  needed Continue IV octreotide drip (due to upper GI bleed) Gastroenterologist (Dr. Eartha) ill be consulted in the morning  Microcytic anemia MCV 73.7, hemoglobin 4.7, hematocrit 17.4 Iron studies will be done  Leukocytosis/thrombocytosis possibly reactive WBC 18.0  Elevated troponin possibly secondary to type II demand ischemia EKG personally reviewed shows sinus tachycardia at rate of 111 with minimal ST depression in inferior leads He denies chest pain other than heartburn Continue to trend troponin  History of PE Eliquis  will be held at this time  Alcohol abuse No withdrawal symptoms at this time Continue to monitor for withdrawal symptoms and consider starting patient on CIWA protocol   Advance Care Planning: Full code  Consults: Gastroenterology  Family Communication: Girlfriend at bedside  Severity of Illness: The appropriate patient status for this patient is OBSERVATION. Observation status is judged to be reasonable and necessary in order to provide the required intensity of service to ensure the patient's safety. The patient's presenting  symptoms, physical exam findings, and initial radiographic and laboratory data in the context of their medical condition is felt to place them at decreased risk for further clinical deterioration. Furthermore, it is anticipated that the patient will be medically stable for discharge from the hospital within 2 midnights of admission.   Author: Dale Strausser, DO 10/11/2024 3:07 AM  For on call review [email or web address].

## 2024-10-12 ENCOUNTER — Inpatient Hospital Stay (HOSPITAL_COMMUNITY)

## 2024-10-12 DIAGNOSIS — E43 Unspecified severe protein-calorie malnutrition: Secondary | ICD-10-CM | POA: Insufficient documentation

## 2024-10-12 DIAGNOSIS — K223 Perforation of esophagus: Secondary | ICD-10-CM

## 2024-10-12 DIAGNOSIS — K922 Gastrointestinal hemorrhage, unspecified: Secondary | ICD-10-CM

## 2024-10-12 DIAGNOSIS — K222 Esophageal obstruction: Secondary | ICD-10-CM

## 2024-10-12 LAB — TRIGLYCERIDES: Triglycerides: 90 mg/dL (ref <150)

## 2024-10-12 LAB — HEMOGLOBIN AND HEMATOCRIT, BLOOD
HCT: 27.2 % — ABNORMAL LOW (ref 39.0–52.0)
HCT: 28.4 % — ABNORMAL LOW (ref 39.0–52.0)
HCT: 28.4 % — ABNORMAL LOW (ref 39.0–52.0)
Hemoglobin: 8.6 g/dL — ABNORMAL LOW (ref 13.0–17.0)
Hemoglobin: 8.8 g/dL — ABNORMAL LOW (ref 13.0–17.0)
Hemoglobin: 8.7 g/dL — ABNORMAL LOW (ref 13.0–17.0)

## 2024-10-12 LAB — LACTIC ACID, PLASMA
Lactic Acid, Venous: 1.8 mmol/L (ref 0.5–1.9)
Lactic Acid, Venous: 1.5 mmol/L (ref 0.5–1.9)

## 2024-10-12 LAB — CBC
HCT: 26.2 % — ABNORMAL LOW (ref 39.0–52.0)
HCT: 25.6 % — ABNORMAL LOW (ref 39.0–52.0)
Hemoglobin: 8.4 g/dL — ABNORMAL LOW (ref 13.0–17.0)
Hemoglobin: 8.1 g/dL — ABNORMAL LOW (ref 13.0–17.0)
MCH: 24.1 pg — ABNORMAL LOW (ref 26.0–34.0)
MCH: 23.8 pg — ABNORMAL LOW (ref 26.0–34.0)
MCHC: 32.1 g/dL (ref 30.0–36.0)
MCHC: 31.6 g/dL (ref 30.0–36.0)
MCV: 75.1 fL — ABNORMAL LOW (ref 80.0–100.0)
MCV: 75.3 fL — ABNORMAL LOW (ref 80.0–100.0)
Platelets: 504 K/uL — ABNORMAL HIGH (ref 150–400)
Platelets: 511 K/uL — ABNORMAL HIGH (ref 150–400)
RBC: 3.49 MIL/uL — ABNORMAL LOW (ref 4.22–5.81)
RBC: 3.4 MIL/uL — ABNORMAL LOW (ref 4.22–5.81)
RDW: 22.6 % — ABNORMAL HIGH (ref 11.5–15.5)
RDW: 23.2 % — ABNORMAL HIGH (ref 11.5–15.5)
WBC: 14.4 K/uL — ABNORMAL HIGH (ref 4.0–10.5)
WBC: 14.1 K/uL — ABNORMAL HIGH (ref 4.0–10.5)
nRBC: 0 % (ref 0.0–0.2)
nRBC: 0 % (ref 0.0–0.2)

## 2024-10-12 LAB — COMPREHENSIVE METABOLIC PANEL WITH GFR
ALT: 11 U/L (ref 0–44)
AST: 13 U/L — ABNORMAL LOW (ref 15–41)
Albumin: 2.1 g/dL — ABNORMAL LOW (ref 3.5–5.0)
Alkaline Phosphatase: 67 U/L (ref 38–126)
Anion gap: 7 (ref 5–15)
BUN: 7 mg/dL (ref 6–20)
CO2: 27 mmol/L (ref 22–32)
Calcium: 7.6 mg/dL — ABNORMAL LOW (ref 8.9–10.3)
Chloride: 103 mmol/L (ref 98–111)
Creatinine, Ser: 0.79 mg/dL (ref 0.61–1.24)
GFR, Estimated: >60 mL/min (ref >60)
Glucose, Bld: 146 mg/dL — ABNORMAL HIGH (ref 70–99)
Potassium: 3.6 mmol/L (ref 3.5–5.1)
Sodium: 137 mmol/L (ref 135–145)
Total Bilirubin: 0.8 mg/dL (ref 0.0–1.2)
Total Protein: 5.5 g/dL — ABNORMAL LOW (ref 6.5–8.1)

## 2024-10-12 LAB — EXPECTORATED SPUTUM ASSESSMENT W GRAM STAIN, RFLX TO RESP C

## 2024-10-12 LAB — GLUCOSE, CAPILLARY
Glucose-Capillary: 101 mg/dL — ABNORMAL HIGH (ref 70–99)
Glucose-Capillary: 106 mg/dL — ABNORMAL HIGH (ref 70–99)
Glucose-Capillary: 115 mg/dL — ABNORMAL HIGH (ref 70–99)
Glucose-Capillary: 125 mg/dL — ABNORMAL HIGH (ref 70–99)

## 2024-10-12 LAB — TYPE AND SCREEN

## 2024-10-12 MED ORDER — SODIUM CHLORIDE 0.9 % IV SOLN: 250.0000 mL | INTRAVENOUS | Status: AC

## 2024-10-12 MED ORDER — NOREPINEPHRINE 4 MG/250ML-% IV SOLN: 0.0000 ug/min | INTRAVENOUS | Status: DC

## 2024-10-12 MED ORDER — NOREPINEPHRINE 4 MG/250ML-% IV SOLN
INTRAVENOUS | Status: AC
  Filled 2024-10-12: qty 250

## 2024-10-12 MED ORDER — FOLIC ACID 5 MG/ML IJ SOLN
1.0000 mg | Freq: Once | INTRAMUSCULAR | Status: AC
  Administered 2024-10-12: 1 mg via INTRAVENOUS
  Filled 2024-10-12: qty 0.2

## 2024-10-12 MED ORDER — MUPIROCIN 2 % EX OINT
1.0000 | TOPICAL_OINTMENT | Freq: Two times a day (BID) | CUTANEOUS | Status: DC
  Administered 2024-10-12 – 2024-10-16 (×9): 1 via NASAL
  Filled 2024-10-12 (×3): qty 22

## 2024-10-12 MED ORDER — DEXTROSE-SODIUM CHLORIDE 5-0.45 % IV SOLN: INTRAVENOUS | Status: DC

## 2024-10-12 MED ORDER — SODIUM CHLORIDE 0.9 % IV SOLN: 1.0000 mg | Freq: Once | INTRAVENOUS | Status: DC

## 2024-10-12 MED ORDER — CHLORHEXIDINE GLUCONATE CLOTH 2 % EX PADS
6.0000 | MEDICATED_PAD | Freq: Every day | CUTANEOUS | Status: DC
  Administered 2024-10-13 – 2024-10-16 (×4): 6 via TOPICAL

## 2024-10-12 NOTE — Transfer of Care (Signed)
 Immediate Anesthesia Transfer of Care Note  Patient: Alan [name]  Procedure(s) Performed: EGD (ESOPHAGOGASTRODUODENOSCOPY)  Patient Location: PACU  Anesthesia Type:General  Level of Consciousness: awake and oriented  Airway & Oxygen Therapy: Patient Spontanous Breathing  Post-op Assessment: Report given to RN and Post -op Vital signs reviewed and stable  Post vital signs: Reviewed  Last Vitals:  Vitals Value Taken Time  BP 84/64 10/12/24 18:45  Temp 36.6 C 10/12/24 15:43  Pulse 65 10/12/24 18:50  Resp 18 10/12/24 18:50  SpO2 97 % 10/12/24 18:50  Vitals shown include unfiled device data.  Last Pain:  Vitals:   10/12/24 1543  TempSrc: Axillary  PainSc:          Complications: No notable events documented.

## 2024-10-12 NOTE — Consult Note (Signed)
 Brief Consult Note  **NOTE: Patient was not examined in person.  Chart and imaging was reviewed remotely.**  Patient is a 58 year old man who was transferred from [facility] with UGI bleed after vomiting at a restaurant.  He is on Eliquis  for history of PE.  He underwent EGD with GI yesterday who identified extensive clot within the esophagus and a longitudinal tear involving the distal 1/3 of the esophagus.  Hemospray was applied.  GI reported extensive mucosal tearing of the esophagus and concern for perforation in the distal esophagus.    His EGD was performed at 1:40 PM, and subsequent CT chest was performed 3 hours later at 4:07 PM.  At that time there was no evidence of pneumomediastinum or fluid in the mediastinum on either the CT report or my review of the images.  On chart review, the patient appears hemodynamically stable with improving leukocytosis and without tachycardia.    Given the lack of air or fluid in the mediastinum, the likelihood of full thickness esophageal perforation or mediastinitis is low - especially since EGD was performed before the CT scan which requires insufflation.  If there is concern for esophageal perforation, the patient would need a CT esophagram or a swallow study/esophagram.  I do not think he has mediastinitis and would not recommend antibiotics for that indication.  Plan discussed with Dr. Isobel.  Con Clunes, MD Cardiothoracic Surgery Pager: [phone]

## 2024-10-12 NOTE — Progress Notes (Addendum)
 NAME:  Alan [name], MRN:  [id number], DOB:  10-10-1966, LOS: 1 ADMISSION DATE:  10/10/2024, CONSULTATION DATE:  12/3 REFERRING MD:  Marston CHIEF COMPLAINT:  Hematemesis   History of Present Illness:  Alan [name] is a 58 year old man with a past medical history of CTEPH, hx of PE 08/2023 (on Eliquis ), COPD, anemia, EtOH use, tobacco use who presented to [facility] 12/3 as a transfer from [facility] for hematemesis.   Patient initially presented to [facility] 12/2PM for nausea, vomiting, acid reflux and abdominal pain after eating seafood at a restaurant. He then progressed to bloody emesis after several episodes of vomiting, and had associated chest and epigastrium discomfort. On ED presentation, patient was afebrile with HR 107, BP 108/72, RR 15, SpO2 99% on RA. Labs were notable for leukocytosis of 18, critically low Hgb 4.7, Plt 588. INR 1.2. Trop 33 > 20. Iron studies unremarkable. After receiving 3u pRBCs, Hgb 8.0. Despite transfusion and IVF, patient continued to vomit a small amount of bright red blood, causing increasing concern due to active anticoagulation with home Eliquis .   CXR demonstrated R lateral midlung pleural thickening. CTA GIB Protocol with intraluminal contrast noted in the distal esophagus/gastric cardia, potential venous hemorrhage vs. GE varices, findings concerning for esophagitis. Kcentra was administered for AC reversal given life-threatening bleeding. The patient was started on Ceftriaxone, octreotide gtt, Protonix and Zofran  in the [facility] ED. GI was urgently consulted and patient was intubated for EGD on 12/3. EGD findings included severe Mallory-Weiss tear with significant hemorrhage; Hemospray was applied. CT Chest demonstrated high-density material c/w hemospray during EGD, no evidence of full-thickness perforation but abnormal esophageal dilation with intraluminal frothy content concerning for mediastinitis, evidence of emphysema and airway thickening with plugging of bilateral  lower lobe bronchi, small bilateral pleural effusions. Broad-spectrum antibiotics started for potential mediastinitis.    Patient was subsequently transferred to [facility] for further evaluation by GI and TCTS and management. PCCM consulted for ICU admission.  Pertinent  Medical History   Past Medical History:  Diagnosis Date   Anemia    COPD (chronic obstructive pulmonary disease) (HCC)    CTEPH (chronic thromboembolic pulmonary hypertension) (HCC)    History of ETOH abuse    Pulmonary embolism (HCC)    On Eliquis    Pulmonary hypertension (HCC)    Right knee pain    Right shoulder pain    Significant Hospital Events: Including procedures, antibiotic start and stop dates in addition to other pertinent events   12/2- Presented to [facility] ED with N/V, abdominal pain. Multiple episodes of emesis with development of hematemesis . Hgb 4.7. 3u pRBCs transfused. CTA showing GIB with intraluminal contrast noted in the distal esophagus/gastric cardia, possible venous hemorrhage vs GE varices 12/3- GI consulted. Intubated for EGD, EGD completed, positive for extensive mucosal tearing (Mallory-Weiss tear) with significant hemorrhage, occlusion with clot, hemospray applied. CT chest without evidence of full-thickness perforation. Transferred to [facility] for ICU admission/TCTS and PCCM evaluation and management. 12/3- PCCM consulted for ICU admission/management  Interim History / Subjective:  2121- s/p 4u pRBCs, patient on octreotide, PPI,  crystalloids drips to meet goal MAP > 65.   Patient resting in bed in NAD during evaluation today, not on vasopressors. Propofol and Fentanyl at 50mcg each.  Objective    Blood pressure (!) 86/68, pulse 65, temperature 98 F (36.7 C), temperature source Oral, resp. rate 18, height 5' 9 (1.753 m), weight 51.1 kg, SpO2 98%.    Vent Mode: PRVC FiO2 (%):  [30 %-100 %]  30 % Set Rate:  [18 bmp] 18 bmp Vt Set:  [560 mL] 560 mL PEEP:  [5 cmH20] 5 cmH20 Plateau Pressure:   [15 cmH20-21 cmH20] 17 cmH20   Intake/Output Summary (Last 24 hours) at 10/12/2024 1110 Last data filed at 10/12/2024 1000 Gross per 24 hour  Intake 2621.21 ml  Output 2275 ml  Net 346.21 ml   Filed Weights   10/10/24 2347 10/11/24 0500  Weight: 56.7 kg 51.1 kg    Examination: General: acutely ill-appearing male, NAD HENT: [place]/AT, anicteric sclera, PERRLA, moist mucous membranes, scant dried blood around bilateral nares Lungs: bilateral diminished breath sounds at bases, crackles at right lung Cardiovascular: RRR, no murmur/rub/gallops Abdomen: soft, nontender, nondistended, bowel sounds positive Extremities: warm, dry, no edema Neuro: intubated and sedated on Fentanyl and Propofol, not responsive to voice or touch. Grimaces to pain stimuli.  Pertinent labs/imagings/tests in past 24hours: Na: 136 K: 4.3 Cl: 101 BUN/Cr: 11/0.83 Albumin : 3.8 BG: 159 Mag: 2.4 Phos: 3.4 Troponins: 33 > 20 PT/INR: 15.5/ 1.2  Hgb: 4.7 (POA) > 8.0 > 9.5 >8.8 > 8.6 WBC: 14.1 Plts: 511  MRSA nares positive  ABG pH 7.497  pCO2 33.8  pO2 122  bicarb 26.1, respiratory alkalosis  Resolved problem list   Assessment and Plan   Respiratory Acute respiratory insufficiency in the setting of hematemesis, EGD COPD CTEPH P: - 65pack year smoking hx, concurrent marijuana and alcohol use  - Last PFTs 06/15/2024 showing moderate COPD, FEV1 48, FVC 72, following with Dr. Darlean at [facility] - Current vent settings FiO2 40% , TV 560, RR 18, PEEP 5 - Continue full vent support (4-8cc/kg IBW) - Wean FiO2 for O2 sat > 90% - Daily WUA/SBT - VAP bundle - Bronchodilators as ordered (triple therapy + PRN) - Pulmonary hygiene as tolerated post-extubation - PAD protocol for sedation: Propofol and Fentanyl for goal RASS -2 to -3 - Daily CXR - Sputum cultures to r/o potential infection - Hold Adempas /diuretics for now, resume as clinically appropriate  History of PE 08/2023 (on Eliquis ) P: - Received Kcentra  administration at [facility] ED on 12/3 for urgent reversal - Will hold Eliquis  given active bleeding - Consider repeat outpatient Echo to monitor for signs of RV dysfunction due to Kcentra reversal.  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 - MAP at goal > 65, not currently on vasopressors but BP remaining soft at SBPs 80-low 100s. Will continue to monitor - Volume/blood product resuscitation as tolerated, currently on D5 1/2 NS infusion at 75mL/hr - Trend H&H q 4hr - Monitor for signs of active bleeding - Patient received 4u pRBCs 12/3, will transfuse for Hgb < 7.0 or hemodynamically significant bleeding - GI consulted, appreciate further recommendations - Continue octreotide and PPI for now - Avoid gastric tube placement - TCTS evaluation 12/4AM  Metabolic History of EtOH use History of Tobacco use P: - Monitor for signs/symptoms of withdrawal, - PAD protocol while intubated, CIWA once extubated - Continue thiamine/folate (start date 12/5) - MV when able to tolerate PO - Encourage cessation; TOC consult as indicated  Tubes/Lines/Therapy Tubes: ETT, foley catheter Lines: 4 PIVs (2 left, 2 right) DVT Ppx: SCDs GI Ppx: PPI gtt,  therapeutic  Labs   CBC: Recent Labs  Lab 10/11/24 0030 10/11/24 0757 10/11/24 1708 10/11/24 2150 10/11/24 2324 10/12/24 0513 10/12/24 0823  WBC 18.0*  --   --   --   --  14.4* 14.1*  HGB 4.7*   < > 9.5* 8.8* 8.6* 8.4* 8.1*  HCT 17.4*   < > 30.6* 26.0* 27.2* 26.2* 25.6*  MCV 73.7*  --   --   --   --  75.1* 75.3*  PLT 588*  --   --    --   --  504* 511*   < > = values in this interval not displayed.    Basic Metabolic Panel: Recent Labs  Lab 10/11/24 0030 10/11/24 0757 10/11/24 2150 10/12/24 0513  NA 136  --  136 137  K 4.3  --  3.6 3.6  CL 101  --   --  103  CO2 22  --   --  27  GLUCOSE 111*  --   --  146*  BUN 11  --   --  7  CREATININE 0.83  --   --  0.79  CALCIUM 8.6*  --   --  7.6*  MG  --  2.4  --   --   PHOS  --  3.4  --   --    GFR: Estimated Creatinine Clearance: 72.7 mL/min (by C-G formula based on SCr of 0.79 mg/dL). Recent Labs  Lab 10/11/24 0030 10/12/24 0513 10/12/24 0823  WBC 18.0* 14.4* 14.1*    Liver Function Tests: Recent Labs  Lab 10/11/24 0030 10/12/24 0513  AST 17 13*  ALT 9 11  ALKPHOS 106 67  BILITOT 0.2 0.8  PROT 7.0 5.5*  ALBUMIN  3.8 2.1*   Recent Labs  Lab 10/11/24 0030  LIPASE 17   ABG    Component Value Date/Time   PHART 7.497 (H) 10/11/2024 2150   PCO2ART 33.8 10/11/2024 2150   PO2ART 122 (H) 10/11/2024 2150   HCO3 26.1 10/11/2024 2150   TCO2 27 10/11/2024 2150   O2SAT 99 10/11/2024 2150     Coagulation Profile: Recent Labs  Lab 10/11/24 0030  INR 1.2   CBG: Recent Labs  Lab 10/11/24 1536 10/11/24 1917 10/11/24 2055 10/11/24 2308  GLUCAP 137* 150* 141* 159*    Surgical History:   Past Surgical History:  Procedure Laterality Date   PULMONARY ANGIOGRAPHY N/A 04/06/2024   Procedure: PULMONARY ANGIOGRAPHY;  Surgeon: Zenaida Morene PARAS, MD;  Location: [facility] INVASIVE CV LAB;  Service: Cardiovascular;  Laterality: N/A;   RIGHT HEART CATH N/A 10/13/2023   Procedure: RIGHT HEART CATH;  Surgeon: Anner Alm ORN, MD;  Location: [facility] INVASIVE CV LAB;  Service: Cardiovascular;  Laterality: N/A;   RIGHT HEART CATH N/A 04/06/2024   Procedure: RIGHT HEART CATH;  Surgeon: Zenaida Morene PARAS, MD;  Location: [facility] INVASIVE CV LAB;  Service: Cardiovascular;  Laterality: N/A;   TONSILLECTOMY  58 yo     Social History:   reports that he has been smoking  cigarettes. He started smoking about 43 years ago. He has a 65 pack-year smoking history. He has never used smokeless tobacco. He reports current alcohol use. He reports current drug use. Drug: Marijuana.   Family History:  His family history includes Asthma in his brother; Diabetes in his father and mother; Heart disease in his mother.   Allergies No Known Allergies   Home Medications  Prior to Admission medications   Medication Sig Start Date End Date Taking?  Authorizing Provider  ADEMPAS  2.5 MG TABS Take 1 tablet by mouth 3 (three) times daily. 09/21/24  Yes Zenaida Morene PARAS, MD  albuterol  (PROAIR  HFA) 108 [phone] Base) MCG/ACT inhaler 2 puffs every 4 hours as needed only  if your can't catch your breath 11/08/23  Yes Darlean Ozell NOVAK, MD  apixaban  (ELIQUIS ) 5 MG TABS tablet Take 1 tablet (5 mg total) by mouth 2 (two) times daily. 09/26/24  Yes Zenaida Morene PARAS, MD  Aspirin -Caffeine (BAYER BACK & BODY PO) Take 1 tablet by mouth daily as needed (pain).   Yes [provider]  famotidine-calcium carbonate-magnesium  hydroxide (PEPCID COMPLETE) 10-800-165 MG chewable tablet Chew 1 tablet by mouth daily as needed (acid reflux).   Yes [provider]  mometasone -formoterol  (DULERA ) 100-5 MCG/ACT AERO Inhale 2 puffs into the lungs 2 (two) times daily. 01/17/24  Yes Milian, Marry Lenis, FNP  Riociguat  (ADEMPAS ) 1 MG TABS Take 1 mg by mouth in the morning, at noon, and at bedtime. 08/25/24  Yes Zenaida Morene PARAS, MD  tiotropium (SPIRIVA ) 18 MCG inhalation capsule Place 1 capsule (18 mcg total) into inhaler and inhale daily. 05/10/24  Yes Darlean Ozell NOVAK, MD  torsemide  (DEMADEX ) 20 MG tablet Take 10 mg by mouth every other day.   Yes [provider]  aspirin -sod bicarb-citric acid (ALKA-SELTZER) 325 MG TBEF tablet Take 650 mg by mouth every 6 (six) hours as needed (indigestion).    [provider]  Dihydroxyaluminum Sod Carb (ROLAIDS PO) Take 1 tablet by mouth daily  as needed (acid reflux).    [provider]     Critical care time: 4     Tanequa Kretz, DO Internal Medicine Resident, PGY-1 Please contact the on call pager at [phone] for any urgent or emergent needs. 11:10 AM 10/12/2024

## 2024-10-12 NOTE — Consult Note (Addendum)
 Consultation  Referring Provider: CCM/Baral Primary Care Physician:  Cathlene Marry Lenis, FNP Primary Gastroenterologist:  Dr. Shaaron  Reason for Consultation: Hematemesis, long Mallory-Weiss tear  HPI: Alan [name] is a 58 y.o. male with history of COPD, prior history of pulmonary embolism, on chronic Eliquis , history of pulmonary hypertension and anemia, and EtOH abuse  Patient had presented to the ER at [facility] yesterday afternoon with complaint of vomiting that started shortly after he had eaten lunch.  He had several episodes of vomiting initially nonbloody then became grossly bloody and associated with a burning sensation in his chest. Initial workup in the ER there revealed a WBC of 18,000 hemoglobin of 4.7/hematocrit 17.4/platelets 588 CT angio of the abdomen and pelvis with contrast showed possible venous hemorrhage or gastroesophageal varices.  Given Kcentra to reverse anticoagulation. He underwent emergent EGD per Dr. Shaaron with finding of extensive clot occupying the entire esophagus nearly the entire esophageal body, there appeared to be an extensive longitudinal tear in the esophagus involving one third of the circumference mid level through the distal esophagus.  Views limited because of clot there was concern for through and through perforation just above the GE junction small hiatal hernia, no other obvious ulcer or abnormality in the stomach.  The tear was treated with Hemospray.  Sent for stat chest CT, and transfer to [facility] recommended.  He had been intubated prior to the EGD and has remained intubated.  CT chest showed the abnormal esophagus.  Somewhat dilated, possible wall thickening no surrounding pneumomediastinum to indicate definite full-thickness perforation, COPD and airway thickening suspicious for bronchitis or reactive airway disease and substantial airway plugging noted small bilateral effusions.  There was small amount of presumed venous gas  in the right lower neck likely intravascular from IV access  Chest x-ray early this a.m.-no mediastinal air, slight worsening of hazy density over the right base atelectasis versus pneumonia small layering effusions.  Patient was transfused a total of 3 units of packed RBCs with hemoglobin up to 8. This morning WBC 14.4/hemoglobin 8.4  He has not had any active bleeding overnight or this morning, no melena or hematochezia.  No OG tube in place  He is being maintained on IV PPI twice daily IV octreotide, not requiring pressors currently      Past Medical History:  Diagnosis Date   Anemia    COPD (chronic obstructive pulmonary disease) (HCC)    CTEPH (chronic thromboembolic pulmonary hypertension) (HCC)    History of ETOH abuse    Pulmonary embolism (HCC)    On Eliquis    Pulmonary hypertension (HCC)    Right knee pain    Right shoulder pain     Past Surgical History:  Procedure Laterality Date   PULMONARY ANGIOGRAPHY N/A 04/06/2024   Procedure: PULMONARY ANGIOGRAPHY;  Surgeon: Zenaida Morene PARAS, MD;  Location: [facility] INVASIVE CV LAB;  Service: Cardiovascular;  Laterality: N/A;   RIGHT HEART CATH N/A 10/13/2023   Procedure: RIGHT HEART CATH;  Surgeon: Anner Alm ORN, MD;  Location: [facility] INVASIVE CV LAB;  Service: Cardiovascular;  Laterality: N/A;   RIGHT HEART CATH N/A 04/06/2024   Procedure: RIGHT HEART CATH;  Surgeon: Zenaida Morene PARAS, MD;  Location: [facility] INVASIVE CV LAB;  Service: Cardiovascular;  Laterality: N/A;   TONSILLECTOMY  58 yo    Prior to Admission medications   Medication Sig Start Date End Date Taking? Authorizing Provider  ADEMPAS  2.5 MG TABS Take 1 tablet by mouth 3 (three) times daily.  09/21/24  Yes Zenaida Morene PARAS, MD  albuterol  (PROAIR  HFA) [phone] Base) MCG/ACT inhaler 2 puffs every 4 hours as needed only  if your can't catch your breath 11/08/23  Yes Darlean Ozell NOVAK, MD  apixaban  (ELIQUIS ) 5 MG TABS tablet Take 1 tablet (5 mg total) by mouth 2 (two) times  daily. 09/26/24  Yes Zenaida Morene PARAS, MD  Aspirin -Caffeine (BAYER BACK & BODY PO) Take 1 tablet by mouth daily as needed (pain).   Yes [provider]  famotidine-calcium carbonate-magnesium  hydroxide (PEPCID COMPLETE) 10-800-165 MG chewable tablet Chew 1 tablet by mouth daily as needed (acid reflux).   Yes [provider]  mometasone -formoterol  (DULERA ) 100-5 MCG/ACT AERO Inhale 2 puffs into the lungs 2 (two) times daily. 01/17/24  Yes Milian, Marry Lenis, FNP  Riociguat  (ADEMPAS ) 1 MG TABS Take 1 mg by mouth in the morning, at noon, and at bedtime. 08/25/24  Yes Zenaida Morene PARAS, MD  tiotropium (SPIRIVA ) 18 MCG inhalation capsule Place 1 capsule (18 mcg total) into inhaler and inhale daily. 05/10/24  Yes Darlean Ozell NOVAK, MD  torsemide  (DEMADEX ) 20 MG tablet Take 10 mg by mouth every other day.   Yes [provider]  aspirin -sod bicarb-citric acid (ALKA-SELTZER) 325 MG TBEF tablet Take 650 mg by mouth every 6 (six) hours as needed (indigestion).    [provider]  Dihydroxyaluminum Sod Carb (ROLAIDS PO) Take 1 tablet by mouth daily as needed (acid reflux).    [provider]    Current Facility-Administered Medications  Medication Dose Route Frequency Provider Last Rate Last Admin   arformoterol (BROVANA) nebulizer solution 15 mcg  15 mcg Nebulization BID Reese, Stephanie M, PA-C   15 mcg at 10/12/24 0820   budesonide (PULMICORT) nebulizer solution 0.25 mg  0.25 mg Nebulization BID Ilah Krabbe M, PA-C   0.25 mg at 10/12/24 0820   cefTRIAXone (ROCEPHIN) 2 g in sodium chloride  0.9 % 100 mL IVPB  2 g Intravenous Q24H Ricky Fines, MD   Paused at 10/11/24 2200   Chlorhexidine  Gluconate Cloth 2 % PADS 6 each  6 each Topical Daily Baral, Dipti, MD       dextrose 5 % and 0.45 % NaCl infusion   Intravenous Continuous Ricky Fines, MD 75 mL/hr at 10/12/24 1100 Infusion Verify at 10/12/24 1100   fentaNYL (SUBLIMAZE) bolus via infusion 25-100 mcg   25-100 mcg Intravenous Q15 min PRN Ricky Fines, MD       fentaNYL (SUBLIMAZE) injection 25-50 mcg  25-50 mcg Intravenous Once Madera, Carlos, MD       fentaNYL in NS (10mcg/ml) infusion-PREMIX  0-400 mcg/hr Intravenous Continuous Ricky Fines, MD 5 mL/hr at 10/12/24 1100 50 mcg/hr at 10/12/24 1100   influenza vac split trivalent PF (FLUZONE) injection 0.5 mL  0.5 mL Intramuscular Tomorrow-1000 Adefeso, Oladapo, DO       ipratropium-albuterol  (DUONEB) 0.5-2.5 (3) MG/3ML nebulizer solution 3 mL  3 mL Nebulization Q6H PRN Ilah Krabbe M, PA-C       labetalol  (NORMODYNE ) injection 5 mg  5 mg Intravenous Q6H PRN Shaaron Lamar HERO, MD       LORazepam (ATIVAN) tablet 1-4 mg  1-4 mg Oral Q1H PRN Ilah Krabbe M, PA-C       Or   LORazepam (ATIVAN) injection 1-4 mg  1-4 mg Intravenous Q1H PRN Ilah Krabbe M, PA-C       metroNIDAZOLE (FLAGYL) IVPB 500 mg  500 mg Intravenous Q12H Ricky Fines, MD   Stopped at  10/12/24 0559   morphine (PF) 2 MG/ML injection 2 mg  2 mg Intravenous Q3H PRN Shaaron Lamar HERO, MD   2 mg at 10/11/24 0745   [START ON 10/13/2024] multivitamin with minerals tablet 1 tablet  1 tablet Oral Daily Ilah Krabbe M, PA-C       mupirocin ointment (BACTROBAN) 2 % 1 Application  1 Application Nasal BID Baral, Dipti, MD   1 Application at 10/12/24 1143   octreotide (SANDOSTATIN) 500 mcg in sodium chloride  0.9 % 250 mL (2 mcg/mL) infusion  50 mcg/hr Intravenous Continuous Shaaron Lamar HERO, MD 25 mL/hr at 10/12/24 1100 50 mcg/hr at 10/12/24 1100   ondansetron  (ZOFRAN ) tablet 4 mg  4 mg Oral Q6H PRN Shaaron Lamar HERO, MD       Or   ondansetron  (ZOFRAN ) injection 4 mg  4 mg Intravenous Q6H PRN Shaaron Lamar HERO, MD       Oral care mouth rinse  15 mL Mouth Rinse Q2H Layman Raisin, DO   15 mL at 10/12/24 1145   Oral care mouth rinse  15 mL Mouth Rinse PRN Layman Raisin, DO       pantoprazole (PROTONIX) injection 40 mg  40 mg Intravenous Q12H Shaaron Lamar HERO, MD   40  mg at 10/12/24 0943   propofol (DIPRIVAN) 1000 MG/100ML infusion  0-80 mcg/kg/min Intravenous Continuous Ricky Fines, MD 15.33 mL/hr at 10/12/24 1100 50 mcg/kg/min at 10/12/24 1100   revefenacin (YUPELRI) nebulizer solution 175 mcg  175 mcg Nebulization Daily Ilah Krabbe M, PA-C   175 mcg at 10/12/24 0820   thiamine (VITAMIN B1) tablet 100 mg  100 mg Oral Daily Ilah Krabbe M, PA-C       Or   thiamine (VITAMIN B1) injection 100 mg  100 mg Intravenous Daily Ilah Krabbe M, PA-C   100 mg at 10/12/24 9058   Facility-Administered Medications Ordered in Other Encounters  Medication Dose Route Frequency Provider Last Rate Last Admin   etomidate (AMIDATE) injection   Intravenous Anesthesia Intra-op Pheobe Adine CROME, CRNA   10 mg at 10/11/24 1405   ondansetron  (ZOFRAN ) injection   Intravenous Anesthesia Intra-op Pheobe Adine CROME, CRNA   4 mg at 10/11/24 1416   propofol (DIPRIVAN) 10 mg/mL bolus/IV push   Intravenous Anesthesia Intra-op Pheobe Adine CROME, CRNA   50 mg at 10/11/24 1414   rocuronium (ZEMURON) injection   Intravenous Anesthesia Intra-op Pheobe Adine CROME, CRNA   50 mg at 10/11/24 1424   succinylcholine (ANECTINE) syringe   Intravenous Anesthesia Intra-op Pheobe Adine CROME, CRNA   120 mg at 10/11/24 1405    Allergies as of 10/10/2024   (No Known Allergies)    Family History  Problem Relation Age of Onset   Heart disease Mother    Diabetes Mother    Diabetes Father    Asthma Brother     Social History   Socioeconomic History   Marital status: Divorced    Spouse name: Not on file   Number of children: Not on file   Years of education: Not on file   Highest education level: Not on file  Occupational History   Not on file  Tobacco Use   Smoking status: Every Day    Current packs/day: 1.50    Average packs/day: 1.5 packs/day for 43.3 years (65.0 ttl pk-yrs)    Types: Cigarettes    Start date: 06/12/1981   Smokeless tobacco: Never   Tobacco  comments:    Smokes 1ppd ... Verified by [facility]  05/12/2023  Vaping Use   Vaping status: Never Used  Substance and Sexual Activity   Alcohol use: Yes    Comment: 12 pack/week   Drug use: Yes    Types: Marijuana    Comment: MJ daily .  no cocaine since 2021   Sexual activity: Yes  Other Topics Concern   Not on file  Social History Narrative   Lives alone.  Disabled.   Social Drivers of Corporate Investment Banker Strain: Not on file  Food Insecurity: No Food Insecurity (10/11/2024)   Hunger Vital Sign    Worried About Running Out of Food in the Last Year: Never true    Ran Out of Food in the Last Year: Never true  Transportation Needs: No Transportation Needs (10/11/2024)   PRAPARE - Administrator, Civil Service (Medical): No    Lack of Transportation (Non-Medical): No  Physical Activity: Not on file  Stress: Not on file  Social Connections: Not on file  Intimate Partner Violence: Not At Risk (10/11/2024)   Humiliation, Afraid, Rape, and Kick questionnaire    Fear of Current or Ex-Partner: No    Emotionally Abused: No    Physically Abused: No    Sexually Abused: No    Review of Systems: Patient unable to offer Physical Exam: Vital signs in last 24 hours: Temp:  [97.5 F (36.4 C)-98.7 F (37.1 C)] 97.6 F (36.4 C) (12/04 1141) Pulse Rate:  [63-98] 63 (12/04 1130) Resp:  [16-21] 18 (12/04 1130) BP: (85-150)/(66-93) 85/68 (12/04 1130) SpO2:  [97 %-100 %] 98 % (12/04 1130) FiO2 (%):  [30 %-100 %] 30 % (12/04 1145) Last BM Date :  (PTA) General:   Alert,  Well-developed, well-nourished,thin older AA male -intubated and sedated Head:  Normocephalic and atraumatic. Eyes:  Sclera clear, no icterus.   Conjunctiva pink. Ears:  Normal auditory acuity. Nose:  No deformity, discharge,  or lesions. Mouth:  No deformity or lesions.   Neck:  Supple; no masses or thyromegaly. Lungs:  Clear throughout to auscultation.  Coarse breath sounds bilaterally  Heart:  Regular rate  and rhythm; no murmurs, clicks, rubs,  or gallops. Abdomen:  Soft, Few bowel sounds are present, unable to appreciate any tenderness/patient stated,    Rectal: Not done Msk:  Symmetrical without gross deformities. . Pulses:  Normal pulses noted. Extremities:  Without clubbing or edema. Neurologic: Intubated and sedated Skin:  Intact without significant lesions or rashes..   Intake/Output from previous day: 12/03 0701 - 12/04 0700 In: 2261.6 [I.V.:1642.5; Blood:319; IV Piggyback:300.1] Out: 2725 [Urine:2725] Intake/Output this shift: Total I/O In: 479.9 [I.V.:479.9] Out: -   Lab Results: Recent Labs    10/11/24 0030 10/11/24 0757 10/12/24 0513 10/12/24 0823 10/12/24 1113  WBC 18.0*  --  14.4* 14.1*  --   HGB 4.7*   < > 8.4* 8.1* 8.6*  HCT 17.4*   < > 26.2* 25.6* 27.2*  PLT 588*  --  504* 511*  --    < > = values in this interval not displayed.   BMET Recent Labs    10/11/24 0030 10/11/24 2150 10/12/24 0513  NA 136 136 137  K 4.3 3.6 3.6  CL 101  --  103  CO2 22  --  27  GLUCOSE 111*  --  146*  BUN 11  --  7  CREATININE 0.83  --  0.79  CALCIUM 8.6*  --  7.6*   LFT Recent Labs    10/12/24 0513  PROT 5.5*  ALBUMIN  2.1*  AST 13*  ALT 11  ALKPHOS 67  BILITOT 0.8   PT/INR Recent Labs    10/11/24 0030  LABPROT 15.5*  INR 1.2   Hepatitis Panel No results for input(s): HEPBSAG, HCVAB, HEPAIGM, HEPBIGM in the last 72 hours.   IMPRESSION:  #76 58 year old African-American male with history of COPD, pulmonary hypertension, anemia and EtOH abuse who presented to the ER yesterday at [facility] after onset of vomiting shortly after he had eaten lunch.  He had a few episodes of vomiting without hematemesis and then eventually vomited dark red blood.  He was complaining of substernal chest discomfort.  Found to have hemoglobin of 4.7 on arrival, transfused a total of 3 units of packed RBCs with hemoglobin up to 8  Stat CTA as above showing some  intraluminal contrast in the distal esophagus some thickening in the distal esophagus and question of venous bleeding  Emergent EGD with finding of extensive clot throughout the entire esophagus and an extensive longitudinal tear involving the distal third of the esophagus and a third of the circumference of the esophagus.  There was concern for through and through perforation at the GE junction  Stat CT of the chest did not show any evidence of perforation or mediastinitis  Hemoglobin 8.4 this a.m.  He has not had any further active bleeding  Fortunately this appears to be a long deep Mallory-Weiss tear without evidence of perforation  #2 bilateral effusions, possible developing pneumonia  #3 acute anemia acute on chronic as above baseline hemoglobin appears to be about 11   PLAN: Continue IV PPI twice daily Okay to continue octreotide today though no definite varices seen at EGD there was limited visualization Continue serial hemoglobins and transfuse as indicated Please do not place any enteral feeding tube  GI will follow with you   Amy EsterwoodPA-C  10/12/2024, 12:45 PM   Attending physician's note  I personally saw the patient and performed a substantive portion of the medical decision making process for this encounter (including a complete performance of the key components : MDM, Hx and Exam), in conjunction with the APP.  I agree with the APP's note, impression, and  the management plan for the number and complexity of problems addressed at the encounter for the patient and take responsibility for that plan with its inherent risk of complications, morbidity, or mortality with additional input as follows.    58 year old male with history of PE on chronic Eliquis , pulmonary hypertension, EtOH abuse transferred from [facility] after he was admitted with vomiting, noted to have Mallory-Weiss tear  No extravasation of contrast or pneumomediastinum on CT  No varices on EGD but  was limited view with extensive clot, currently on octreotide gtt. can consider discontinuing it if he completed total 72 hours and has no ongoing GI bleed Hemoglobin is stable PPI twice daily  No plan for repeat EGD at this point  Okay to place small bore Dobbhoff catheter if needed, do not try to advance if meets any resistance   The patient was provided an opportunity to ask questions and all were answered. The patient agreed with the plan and demonstrated an understanding of the instructions.  LOIS Wilkie Mcgee , MD [phone]

## 2024-10-12 NOTE — Anesthesia Postprocedure Evaluation (Signed)
 Anesthesia Post Note  Patient: Alan [name]  Procedure(s) Performed: EGD (ESOPHAGOGASTRODUODENOSCOPY)  Anesthesia Type: General Anesthetic complications: no   No notable events documented.   Last Vitals:  Vitals:   10/12/24 1500 10/12/24 1543  BP: (!) 85/69   Pulse: 64   Resp: 18   Temp:  36.6 C  SpO2: 99%     Last Pain:  Vitals:   10/12/24 1543  TempSrc: Axillary  PainSc:                  Andrea Limes

## 2024-10-12 NOTE — Progress Notes (Signed)
 Initial Nutrition Assessment  DOCUMENTATION CODES:  Underweight, Severe malnutrition in context of social or environmental circumstances  INTERVENTION:  Recommend initiation of TPN within the next 24-48 hours due to severe malnutrition unless pt can be extubated and diet can be advanced to liquids Pt will be at risk of refeeding with initiation of nutrition support Start thiamine IV x 5 days  NUTRITION DIAGNOSIS:  Severe Malnutrition related to social / environmental circumstances (alcohol abuse) as evidenced by severe muscle depletion, severe fat depletion.  GOAL:  Patient will meet greater than or equal to 90% of their needs  MONITOR:  Vent status, Labs, I & O's  REASON FOR ASSESSMENT:  Ventilator    ASSESSMENT:  Pt with hx of COPD, alcohol abuse, and PE presented to ED with vomiting which became bloody after severe occurences. Emergently taken for EGD which showed extensive mucosal tearing of the esophagus. Left intubated and transferred to [facility]   12/2 - presented to [facility] ED 12/3 - EGD showed severe Mallory-Weiss tear with significant hemorrhage, transferred to [facility] on vent   Patient is currently intubated on ventilator support MV: 9.9 L/min Temp (24hrs), Avg:98 F (36.7 C), Min:97.5 F (36.4 C), Max:98.6 F (37 C) MAP (cuff): 71-57mmHg this shift  Propofol: 15.33 ml/hr (405 kcal/d)  Admit weight: 56.7 kg - appears stated  Current weight: 51.1 kg    Intake/Output Summary (Last 24 hours) at 10/12/2024 1550 Last data filed at 10/12/2024 1400 Gross per 24 hour  Intake 2782.96 ml  Output 1800 ml  Net 982.96 ml  Net IO Since Admission: 1,814.15 mL [10/12/24 1550]  Drains/Lines: UOP x 24 hours  Nutritionally Relevant Medications: Scheduled Meds:  multivitamin with minerals  1 tablet Oral Daily   pantoprazole (PROTONIX) IV  40 mg Intravenous Q12H   thiamine  100 mg Intravenous Daily   Continuous Infusions:  cefTRIAXone (ROCEPHIN)  IV  Stopped (10/11/24 2200)   dextrose 5 % and 0.45 % NaCl 75 mL/hr at 10/12/24 1400   fentaNYL infusion INTRAVENOUS 50 mcg/hr (10/12/24 1400)   octreotide (SANDOSTATIN) 500 mcg in sodium chloride  0.9 % 250 mL (2 mcg/mL) infusion 50 mcg/hr (10/12/24 1400)   propofol (DIPRIVAN) infusion 50 mcg/kg/min (10/12/24 1400)   PRN Meds: ondansetron   Labs Reviewed: CBG ranges from 125-159 mg/dL over the last 24 hours   NUTRITION - FOCUSED PHYSICAL EXAM: Flowsheet Row Most Recent Value  Orbital Region Severe depletion  Upper Arm Region Severe depletion  Thoracic and Lumbar Region Severe depletion  Buccal Region Severe depletion  Temple Region Severe depletion  Clavicle Bone Region Severe depletion  Clavicle and Acromion Bone Region Severe depletion  Scapular Bone Region Severe depletion  Dorsal Hand Unable to assess  [mittens]  Patellar Region Severe depletion  Anterior Thigh Region Severe depletion  Posterior Calf Region Moderate depletion  Edema (RD Assessment) None  Hair Reviewed  Eyes Reviewed  Mouth Reviewed  Skin Reviewed  Nails Unable to assess  [mittens]    Diet Order:   Diet Order             Diet NPO time specified  Diet effective now                   EDUCATION NEEDS:  Not appropriate for education at this time  Skin:  Skin Assessment: Reviewed RN Assessment  Last BM:  12/2  Height:   Ht Readings from Last 1 Encounters:  10/11/24 5' 9 (1.753 m)    Weight:  Wt Readings from Last 1 Encounters:  10/11/24 51.1 kg    Ideal Body Weight:  72.7 kg  BMI:  Body mass index is 16.64 kg/m.  Estimated Nutritional Needs:  Kcal:  1600-1800 kcal/d Protein:  80-100 g/d Fluid:  >/=1.8L/d    Vernell Lukes, RD, LDN, CNSC Registered Dietitian II Please reach out via secure chat

## 2024-10-13 ENCOUNTER — Encounter (HOSPITAL_COMMUNITY): Payer: Self-pay | Admitting: Internal Medicine

## 2024-10-13 ENCOUNTER — Inpatient Hospital Stay (HOSPITAL_COMMUNITY)

## 2024-10-13 LAB — GLUCOSE, CAPILLARY
Glucose-Capillary: 109 mg/dL — ABNORMAL HIGH (ref 70–99)
Glucose-Capillary: 116 mg/dL — ABNORMAL HIGH (ref 70–99)
Glucose-Capillary: 109 mg/dL — ABNORMAL HIGH (ref 70–99)
Glucose-Capillary: 106 mg/dL — ABNORMAL HIGH (ref 70–99)
Glucose-Capillary: 129 mg/dL — ABNORMAL HIGH (ref 70–99)

## 2024-10-13 LAB — PHOSPHORUS: Phosphorus: 2.8 mg/dL (ref 2.5–4.6)

## 2024-10-13 LAB — COMPREHENSIVE METABOLIC PANEL WITH GFR
ALT: 9 U/L (ref 0–44)
AST: 14 U/L — ABNORMAL LOW (ref 15–41)
Albumin: 2 g/dL — ABNORMAL LOW (ref 3.5–5.0)
Alkaline Phosphatase: 62 U/L (ref 38–126)
Anion gap: 14 (ref 5–15)
BUN: 7 mg/dL (ref 6–20)
CO2: 23 mmol/L (ref 22–32)
Calcium: 7.8 mg/dL — ABNORMAL LOW (ref 8.9–10.3)
Chloride: 104 mmol/L (ref 98–111)
Creatinine, Ser: 0.81 mg/dL (ref 0.61–1.24)
GFR, Estimated: >60 mL/min (ref >60)
Glucose, Bld: 121 mg/dL — ABNORMAL HIGH (ref 70–99)
Potassium: 3.7 mmol/L (ref 3.5–5.1)
Sodium: 141 mmol/L (ref 135–145)
Total Bilirubin: 0.4 mg/dL (ref 0.0–1.2)
Total Protein: 5.6 g/dL — ABNORMAL LOW (ref 6.5–8.1)

## 2024-10-13 LAB — HEMOGLOBIN AND HEMATOCRIT, BLOOD
HCT: 29.3 % — ABNORMAL LOW (ref 39.0–52.0)
HCT: 29.6 % — ABNORMAL LOW (ref 39.0–52.0)
HCT: 30.6 % — ABNORMAL LOW (ref 39.0–52.0)
HCT: 31.9 % — ABNORMAL LOW (ref 39.0–52.0)
HCT: 32.9 % — ABNORMAL LOW (ref 39.0–52.0)
Hemoglobin: 8.9 g/dL — ABNORMAL LOW (ref 13.0–17.0)
Hemoglobin: 9 g/dL — ABNORMAL LOW (ref 13.0–17.0)
Hemoglobin: 9.1 g/dL — ABNORMAL LOW (ref 13.0–17.0)
Hemoglobin: 9.4 g/dL — ABNORMAL LOW (ref 13.0–17.0)
Hemoglobin: 9.8 g/dL — ABNORMAL LOW (ref 13.0–17.0)

## 2024-10-13 LAB — CBC
HCT: 31.8 % — ABNORMAL LOW (ref 39.0–52.0)
Hemoglobin: 9.7 g/dL — ABNORMAL LOW (ref 13.0–17.0)
MCH: 24.4 pg — ABNORMAL LOW (ref 26.0–34.0)
MCHC: 30.5 g/dL (ref 30.0–36.0)
MCV: 79.9 fL — ABNORMAL LOW (ref 80.0–100.0)
Platelets: 441 K/uL — ABNORMAL HIGH (ref 150–400)
RBC: 3.98 MIL/uL — ABNORMAL LOW (ref 4.22–5.81)
RDW: 24.6 % — ABNORMAL HIGH (ref 11.5–15.5)
WBC: 17.9 K/uL — ABNORMAL HIGH (ref 4.0–10.5)
nRBC: 0 % (ref 0.0–0.2)

## 2024-10-13 LAB — MAGNESIUM: Magnesium: 2.4 mg/dL (ref 1.7–2.4)

## 2024-10-13 MED ORDER — RIOCIGUAT 2.5 MG PO TABS
1.0000 | ORAL_TABLET | Freq: Three times a day (TID) | ORAL | Status: DC
  Administered 2024-10-13 – 2024-10-16 (×9): 1 via ORAL
  Filled 2024-10-13 (×14): qty 1

## 2024-10-13 MED ORDER — BOOST / RESOURCE BREEZE PO LIQD CUSTOM
1.0000 | Freq: Three times a day (TID) | ORAL | Status: DC
  Administered 2024-10-13 – 2024-10-15 (×5): 1 via ORAL

## 2024-10-13 MED ORDER — FOLIC ACID 5 MG/ML IJ SOLN
1.0000 mg | Freq: Every day | INTRAMUSCULAR | Status: DC
  Administered 2024-10-13 – 2024-10-15 (×3): 1 mg via INTRAVENOUS
  Filled 2024-10-13 (×3): qty 0.2

## 2024-10-13 MED ORDER — MOMETASONE FURO-FORMOTEROL FUM 100-5 MCG/ACT IN AERO
2.0000 | INHALATION_SPRAY | Freq: Two times a day (BID) | RESPIRATORY_TRACT | Status: DC
  Filled 2024-10-13: qty 8.8

## 2024-10-13 MED ORDER — UMECLIDINIUM BROMIDE 62.5 MCG/ACT IN AEPB: 1.0000 | INHALATION_SPRAY | Freq: Every day | RESPIRATORY_TRACT | Status: DC

## 2024-10-13 MED ORDER — TIOTROPIUM BROMIDE 18 MCG IN CAPS
18.0000 ug | ORAL_CAPSULE | Freq: Every day | RESPIRATORY_TRACT | Status: DC
  Administered 2024-10-15 – 2024-10-16 (×2): 18 ug via RESPIRATORY_TRACT
  Filled 2024-10-13: qty 30

## 2024-10-13 NOTE — Progress Notes (Signed)
 Patient extubated per written order with RT and RN at bedside, cuff leak present prior to extubating, no stridor post. Patient placed on 4L [place] and tolerating well at this time.

## 2024-10-13 NOTE — Progress Notes (Signed)
 Pt home medication Adepas counted and taken down to pharmacy. Pt and significant other instructed that before discharge from the hospital they need to request their medication from pharmacy to take home.

## 2024-10-13 NOTE — Progress Notes (Addendum)
 Patient ID: Alan [name], male   DOB: 1966/05/04, 58 y.o.   MRN: [id number]    Progress Note   Subjective   Day # 3 CC; nausea vomiting followed by gross hematemesis  Eliquis  reversed day 1 IV PPI twice daily IV octreotide  Status post 4 units packed RBCs  Labs today WBC 17.9/hemoglobin 9.7/hematocrit 31.8 stable Potassium 3.7 BUN/7/creatinine 0.81  Chest x-ray bibasilar patchy airspace opacities right greater than left bilateral trace effusions dilated esophagus  Patient has been stay overnight, no stools, no evidence of bleeding.  He has had scant amounts of dark red heme via the ET tube Hemodynamically stable   Objective   Vital signs in last 24 hours: Temp:  [97.4 F (36.3 C)-98.6 F (37 C)] 98.4 F (36.9 C) (12/05 0717) Pulse Rate:  [62-69] 62 (12/05 0741) Resp:  [18] 18 (12/05 0700) BP: (80-110)/(64-83) 102/81 (12/05 0700) SpO2:  [97 %-100 %] 100 % (12/05 0741) FiO2 (%):  [30 %] 30 % (12/05 0741) Last BM Date :  (PTA) General:    African-American male, intubated, sedated Heart:  Regular rate and rhythm; no murmurs Lungs: Respirations even and unlabored, coarse breath sounds bilaterally Abdomen:  Soft, nondistended, bowel sounds are present, unable to appreciate any tenderness Extremities:  Without edema. Neurologic: Intubated and sedated   Intake/Output from previous day: 12/04 0701 - 12/05 0700 In: 2802.4 [I.V.:2702.4; IV Piggyback:100] Out: 1800 [Urine:1800] Intake/Output this shift: No intake/output data recorded.  Lab Results: Recent Labs    10/12/24 0513 10/12/24 0823 10/12/24 1113 10/12/24 2352 10/13/24 0411 10/13/24 0814  WBC 14.4* 14.1*  --   --  17.9*  --   HGB 8.4* 8.1*   < > 9.1* 9.7* 8.9*  HCT 26.2* 25.6*   < > 29.6* 31.8* 29.3*  PLT 504* 511*  --   --  441*  --    < > = values in this interval not displayed.   BMET Recent Labs    10/11/24 0030 10/11/24 2150 10/12/24 0513 10/13/24 0411  NA 136 136 137 141  K 4.3 3.6 3.6 3.7   CL 101  --  103 104  CO2 22  --  27 23  GLUCOSE 111*  --  146* 121*  BUN 11  --  7 7  CREATININE 0.83  --  0.79 0.81  CALCIUM 8.6*  --  7.6* 7.8*   LFT Recent Labs    10/13/24 0411  PROT 5.6*  ALBUMIN  2.0*  AST 14*  ALT 9  ALKPHOS 62  BILITOT 0.4   PT/INR Recent Labs    10/11/24 0030  LABPROT 15.5*  INR 1.2    Studies/Results: DG CHEST PORT 1 VIEW Result Date: 10/13/2024 EXAM: 1 VIEW(S) XRAY OF THE CHEST 10/13/2024 05:08:00 AM COMPARISON: 10/12/2024 CLINICAL HISTORY: 01364 Infection 01364; 4361 Mediastinitis 5638 FINDINGS: LINES, TUBES AND DEVICES: Endotracheal tube in place with tip 6.4 cm above the carina. LUNGS AND PLEURA: Bibasilar patchy airspace opacities, right greater than left. Bilateral trace pleural effusions. No pneumothorax. HEART AND MEDIASTINUM: Dilated esophagus is again noted. No acute abnormality of the cardiac and mediastinal silhouettes. BONES AND SOFT TISSUES: Old healed right posterior rib fracture. IMPRESSION: 1. Bibasilar patchy airspace opacities, right greater than left. 2. Bilateral trace pleural effusions. 3. Dilated esophagus. Electronically signed by: Waddell Calk MD 10/13/2024 06:50 AM EST RP Workstation: [id number]   Portable Chest xray Result Date: 10/12/2024 CLINICAL DATA:  Endotracheal tube placement. EXAM: PORTABLE CHEST 1 VIEW COMPARISON:  10/11/2024, chest CT 10/11/2024 FINDINGS:  Interval placement of endotracheal tube with tip 6.1 cm above the carina. Lungs are adequately inflated with minimal hazy bibasilar opacification compatible with small layering effusions and atelectasis as seen on recent CT. Slight worsening hazy density over the right base possibly worsening atelectasis or developing infection. Cardiomediastinal silhouette and remainder of the exam is unchanged. IMPRESSION: 1. Slight worsening hazy density over the right base which may be due to worsening atelectasis or developing infection. 2. Small layering effusions and bibasilar  atelectasis as seen on recent CT. 3. Endotracheal tube with tip 6.1 cm above the carina. Electronically Signed   By: Toribio Agreste M.D.   On: 10/12/2024 08:29   CT CHEST WO CONTRAST Result Date: 10/11/2024 EXAM: CT CHEST WITHOUT CONTRAST 10/11/2024 04:12:40 PM TECHNIQUE: CT of the chest was performed without the administration of intravenous contrast. Multiplanar reformatted images are provided for review. Automated exposure control, iterative reconstruction, and/or weight based adjustment of the mA/kV was utilized to reduce the radiation dose to as low as reasonably achievable. COMPARISON: CT angiogram of the abdomen 10/11/2024 and CTA chest 07/25/2023. CLINICAL HISTORY: Active bleeding into the distal esophagus on CT angiogram of 10/11/2024; EGD performed today showed extensive longitudinal tear in the mid to distal esophagus with extensive clot in the esophagus; hemospray was utilized. FINDINGS: MEDIASTINUM: Heart and pericardium are unremarkable. Thoracic aortic, coronary artery, and branch vessel atheromatous vascular calcifications. 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 The endotracheal tube appears satisfactorily positioned. Diffuse dilation of the esophagus with heterogeneous frothy intraluminal material. Dilated thoracic esophagus. High density material along the wall of the esophagus especially anteriorly and eccentric to the left extending all the way to the proximal stomach, compatible with hemospray administered at the time of EGD. Very indistinct margins between the esophageal wall and surrounding mediastinum with diffuse high density edema in the mediastinum. None of the hemospray is directly observed to extend extraluminal. I cannot exclude esophageal wall thickening as most of the frothy material is centrally within the lumen. No definite extraluminal gas around the esophagus. Administering the patient oral contrast is not readily feasible given the intubated status. LYMPH  NODES: No mediastinal, hilar or axillary lymphadenopathy. LUNGS AND PLEURA: Small bilateral pleural effusions. Emphysema. Airway thickening suggest bronchitis or reactive airways disease. Substantial airway plugging in both lower lobes, with essentially occluded lower lobe bronchi bilaterally. 4 x 3 mm right upper lobe pulmonary nodule on image 65 series 5 stable from 07/15/2023 and considered benign. No focal consolidation or pulmonary edema. No pneumothorax. SOFT TISSUES/BONES: Small amount of gas in the right neck, likely within venous structures and probably related to IV, image 15 series 3. No acute abnormality of the bones or soft tissues. UPPER ABDOMEN: Limited images of the upper abdomen demonstrates vicarious contrast excretion in the gallbladder along with some residual contrast in the renal collecting systems. No acute abnormality. IMPRESSION: 1. The abnormal esophagus appears dilated with substantial frothy intraluminal contents, high-density peripheral material compatible with hemospray administered at egd , and possible wall thickening. No surrounding pneumomediastinum to indicate definite full thickness perforation. 2. Emphysema and airway thickening suspicious for bronchitis or reactive airways disease, with substantial airway plugging occluding both lower lobe bronchi bilaterally. 3. Small bilateral pleural effusions. . 4. Multiple additional chronic and incidental findings, including atherosclerotic vascular calcifications, stable right upper tracheal diverticulum, endotracheal tube in satisfactory position, small amount of presumed venous gas in the right lower neck likely intravascular from IV access, and ancillary contrast-related findings, as detailed in the report body. Electronically  signed by: Ryan Salvage MD 10/11/2024 05:15 PM EST RP Workstation: [id number]       Assessment / Plan:    #13 58 year old male with history of COPD, pulmonary hypertension, anemia and EtOH abuse who  presented to the ER at [facility] on 10/11/2024 after he had started vomiting shortly after eating a meal.  He apparently had a few episodes of nonbloody emesis and then eventually vomited a large amount of dark red blood and was complaining of substernal chest discomfort after that.  Mild to have a hemoglobin of 4.7, transfused 3 units of packed RBCs with hemoglobin up to 8  Underwent emergent EGD with finding of extensive clot throughout the entire esophagus and an extensive longitudinal tear involving the distal third of the esophagus.  There was concern for possible through and through perforation at the GE junction  Stat CT of the chest did not show any evidence of perforation or mediastinitis  Transferred here for higher level of care.  Had been prophylactically intubated  He has been stable since arrival here with no further active bleeding  Continues on IV PPI twice daily and IV octreotide  No transfusion requirement here  #2 bilateral effusions/possible pneumonia  #3 history of EtOH abuse-no evident EtOH withdrawal, and no criteria to suggest underlying cirrhosis with available labs.  Plan; discussed with ICU residents, okay from GI perspective to extubate today.  We do not plan repeat EGD at this time as he has been very stable  If passes swallow eval, can start clear liquids Stop IV octreotide  Continue IV PPI for now-once he is alert, and tolerating diet, can change to Protonix  40 mg p.o. twice daily.  He will need to stay on twice daily PPI for 6 weeks, then once daily thereafter  Tolerates clear liquids today can advance to full liquids tomorrow, then soft diet  GI will sign off, available if needed, call for questions.  He should have a follow-up with GI in [place]/Dr. Rourk postdischarge.    Principal Problem:   Upper GI bleed Active Problems:   Iron deficiency anemia due to chronic blood loss   Protein-calorie malnutrition, severe     LOS: 2 days   Amy  Esterwood PA-C 10/13/2024, 8:44 AM    Attending physician's note   I personally saw the patient and performed a substantive portion of the medical decision making process for this encounter (including a complete performance of the key components : MDM, Hx and Exam), in conjunction with the APP.  I agree with the APP's note, impression, and  the management plan for the number and complexity of problems addressed at the encounter for the patient and take responsibility for that plan with its inherent risk of complications, morbidity, or mortality with additional input as follows.      Extubated, tolerating clear liquid diet Hemoglobin remained stable Discontinue octreotide  Continue PPI IV twice daily, transition to oral once tolerating diet  Follow-up with GI in [place] on discharge, may consider follow-up EGD in 6 to 8 weeks   Inpatient GI signing off, available if have any questions  I have spent >35 minutes of patient care (this includes precharting, chart review, review of results, face-to-face time used for counseling as well as treatment plan and follow-up. The patient was provided an opportunity to ask questions and all were answered. The patient agreed with the plan and demonstrated an understanding of the instructions.   LOIS Wilkie Mcgee , MD [phone]

## 2024-10-13 NOTE — Progress Notes (Addendum)
 NAME:  Alan [name], MRN:  [id number], DOB:  [birth year], LOS: 2 ADMISSION DATE:  10/10/2024, CONSULTATION DATE:  12/3 REFERRING MD:  Marston CHIEF COMPLAINT:  Hematemesis   History of Present Illness:  Alan [name] is a 58 year old man with a past medical history of CTEPH, hx of PE 08/2023 (on Eliquis ), COPD, anemia, EtOH use, tobacco use who presented to [facility] 12/3 as a transfer from [facility] for hematemesis.   Patient initially presented to [facility] 12/2PM for nausea, vomiting, acid reflux and abdominal pain after eating seafood at a restaurant. He then progressed to bloody emesis after several episodes of vomiting, and had associated chest and epigastrium discomfort. On ED presentation, patient was afebrile with HR 107, BP 108/72, RR 15, SpO2 99% on RA. Labs were notable for leukocytosis of 18, critically low Hgb 4.7, Plt 588. INR 1.2. Trop 33 > 20. Iron studies unremarkable. After receiving 3u pRBCs, Hgb 8.0. Despite transfusion and IVF, patient continued to vomit a small amount of bright red blood, causing increasing concern due to active anticoagulation with home Eliquis .   CXR demonstrated R lateral midlung pleural thickening. CTA GIB Protocol with intraluminal contrast noted in the distal esophagus/gastric cardia, potential venous hemorrhage vs. GE varices, findings concerning for esophagitis. Kcentra  was administered for AC reversal given life-threatening bleeding. The patient was started on Ceftriaxone , octreotide  gtt, Protonix  and Zofran  in the [facility] ED. GI was urgently consulted and patient was intubated for EGD on 12/3. EGD findings included severe Mallory-Weiss tear with significant hemorrhage; Hemospray was applied. CT Chest demonstrated high-density material c/w hemospray during EGD, no evidence of full-thickness perforation but abnormal esophageal dilation with intraluminal frothy content concerning for mediastinitis, evidence of emphysema and airway thickening with plugging of bilateral  lower lobe bronchi, small bilateral pleural effusions. Broad-spectrum antibiotics started for potential mediastinitis.    Patient was subsequently transferred to [facility] for further evaluation by GI and TCTS and management. PCCM consulted for ICU admission.  Pertinent  Medical History   Past Medical History:  Diagnosis Date   Anemia    COPD (chronic obstructive pulmonary disease) (HCC)    CTEPH (chronic thromboembolic pulmonary hypertension) (HCC)    History of ETOH abuse    Pulmonary embolism (HCC)    On Eliquis    Pulmonary hypertension (HCC)    Right knee pain    Right shoulder pain    Significant Hospital Events: Including procedures, antibiotic start and stop dates in addition to other pertinent events   12/2- Presented to [facility] ED with N/V, abdominal pain. Multiple episodes of emesis with development of hematemesis . Hgb 4.7. 3u pRBCs transfused. CTA showing GIB with intraluminal contrast noted in the distal esophagus/gastric cardia, possible venous hemorrhage vs GE varices 12/3- GI consulted. Intubated for EGD, EGD completed, positive for extensive mucosal tearing (Mallory-Weiss tear) with significant hemorrhage, occlusion with clot, hemospray applied. CT chest without evidence of full-thickness perforation. Transferred to [facility] for ICU admission/TCTS and PCCM evaluation and management. 12/3- PCCM consulted for ICU admission/management  Interim History / Subjective:  No overnight events.   Objective    Blood pressure 101/76, pulse 76, temperature 98.4 F (36.9 C), temperature source Oral, resp. rate 18, height 5' 9 (1.753 m), weight 51.1 kg, SpO2 99%.    Vent Mode: PRVC FiO2 (%):  [30 %] 30 % Set Rate:  [18 bmp] 18 bmp Vt Set:  [560 mL] 560 mL PEEP:  [5 cmH20] 5 cmH20 Plateau Pressure:  [17 cmH20-21 cmH20] 21 cmH20   Intake/Output Summary (  Last 24 hours) at 10/13/2024 9062 Last data filed at 10/13/2024 0700 Gross per 24 hour  Intake 2562.9 ml  Output 1600 ml  Net 962.9  ml   Filed Weights   10/10/24 2347 10/11/24 0500  Weight: 56.7 kg 51.1 kg    Examination: General: acutely ill-appearing male, NAD HENT: [place]/AT, anicteric sclera, PERRLA, moist mucous membranes, scant dried blood around bilateral nares Lungs: bilateral diminished breath sounds at bases, crackles at right lung Cardiovascular: RRR, no murmur/rub/gallops Abdomen: soft, nontender, nondistended, bowel sounds positive Extremities: warm, dry, no edema Neuro: intubated and sedated on Fentanyl  and Propofol , not responsive to voice or touch. Grimaces to pain stimuli.  Pertinent labs/imagings/tests in past 24hours: Na: 141 K: 3.7 Cl: 104 BUN/Cr: 7/0.81 Albumin : 2.0 BG: 109 Mag: 2.4 Phos: 2.8 Troponins: 33 > 20 PT/INR: 15.5/ 1.2  Hgb: 9.7/31.8 WBC: 17.9 Plts: 441  MRSA nares positive  Resolved problem list   Assessment and Plan   Respiratory Acute respiratory insufficiency in the setting of hematemesis, EGD COPD CTEPH P: - 65pack year smoking hx, concurrent marijuana and alcohol use  - Last PFTs 06/15/2024 showing moderate COPD, FEV1 48, FVC 72, following with Dr. Darlean at [facility] - Current vent settings FiO2 30% , TV 560, RR 18, PEEP 5 - Continue full vent support (4-8cc/kg IBW) - Wean FiO2 for O2 sat > 90% - Daily WUA/SBT - VAP bundle - Bronchodilators as ordered (triple therapy + PRN) - Pulmonary hygiene as tolerated post-extubation - PAD protocol for sedation: Propofol  and Fentanyl  for goal RASS -2 to -3 - Daily CXR - Sputum cultures to r/o potential infection - Hold Adempas /diuretics for now, resume as clinically appropriate  History of PE 08/2023 (on Eliquis ) P: - Received Kcentra  administration at [facility] ED on 12/3 for urgent reversal - Will hold Eliquis  given active bleeding - Consider repeat outpatient Echo to monitor for signs of RV dysfunction due to Kcentra  reversal.  Infection Pneumonia P: -Mediastinitis ruled out per TCTS and per CT Chest imaging showing dilated  esophagus and intraluminal contents. Appreciate TCTS recommendation. Deescalated abx coverage to just CTX for PNA.  - WBC worsened to 17.9 this morning from 14.1 overnight.  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 - MAP at goal > 65, not currently on vasopressors but BP remaining soft at SBPs 90-low 100s. Will continue to monitor - Volume/blood product resuscitation as tolerated - Hgb this morning 8.9, will continue to trend H&H q 4hr - Monitor for signs of active bleeding - Patient received 4u pRBCs 12/3, will transfuse for Hgb < 7.0 or hemodynamically significant bleeding - GI following, no repeat EGD for now. - Continue octreotide  50mcg for 72hrs since onset and PPI BID for now, stop IVF D5 1/2 - Okay for small bore Dobbhoff catheter if needed but do not advance if resistance is met per GI  Metabolic History of EtOH use History of Tobacco use P: - Monitor for signs/symptoms of withdrawal, - PAD protocol while intubated, CIWA once extubated - Continue thiamine /folate (start date 12/5) - Encourage cessation; TOC consult as indicated  Tubes/Lines/Therapy Tubes: ETT, foley catheter Lines: 4 PIVs (2 left, 2 right) DVT Ppx: SCDs GI Ppx: PPI gtt, therapeutic  Labs  CBC: Recent Labs  Lab 10/11/24 0030 10/11/24 0757 10/12/24 0513 10/12/24 0823 10/12/24 1113 10/12/24 1736 10/12/24 2012 10/12/24 2352 10/13/24 0411 10/13/24 0814  WBC 18.0*  --  14.4* 14.1*  --   --   --   --  17.9*  --   HGB 4.7*   < > 8.4* 8.1*   < > 8.8* 8.7* 9.1* 9.7* 8.9*  HCT 17.4*   < > 26.2* 25.6*   < > 28.4* 28.4* 29.6* 31.8* 29.3*  MCV 73.7*   --  75.1* 75.3*  --   --   --   --  79.9*  --   PLT 588*  --  504* 511*  --   --   --   --  441*  --    < > = values in this interval not displayed.    Basic Metabolic Panel: Recent Labs  Lab 10/11/24 0030 10/11/24 0757 10/11/24 2150 10/12/24 0513 10/13/24 0411  NA 136  --  136 137 141  K 4.3  --  3.6 3.6 3.7  CL 101  --   --  103 104  CO2 22  --   --  27 23  GLUCOSE 111*  --   --  146* 121*  BUN 11  --   --  7 7  CREATININE 0.83  --   --  0.79 0.81  CALCIUM 8.6*  --   --  7.6* 7.8*  MG  --  2.4  --   --  2.4  PHOS  --  3.4  --   --  2.8   GFR: Estimated Creatinine Clearance: 71.8 mL/min (by C-G formula based on SCr of 0.81 mg/dL). Recent Labs  Lab 10/11/24 0030 10/12/24 0513 10/12/24 0823 10/12/24 1736 10/12/24 2012 10/13/24 0411  WBC 18.0* 14.4* 14.1*  --   --  17.9*  LATICACIDVEN  --   --   --  1.8 1.5  --     Liver Function Tests: Recent Labs  Lab 10/11/24 0030 10/12/24 0513 10/13/24 0411  AST 17 13* 14*  ALT 9 11 9   ALKPHOS 106 67 62  BILITOT 0.2 0.8 0.4  PROT 7.0 5.5* 5.6*  ALBUMIN  3.8 2.1* 2.0*   Recent Labs  Lab 10/11/24 0030  LIPASE 17   ABG    Component Value Date/Time   PHART 7.497 (H) 10/11/2024 2150   PCO2ART 33.8 10/11/2024 2150   PO2ART 122 (H) 10/11/2024 2150   HCO3 26.1 10/11/2024 2150   TCO2 27 10/11/2024 2150   O2SAT 99 10/11/2024 2150     Coagulation Profile: Recent Labs  Lab 10/11/24 0030  INR 1.2   CBG: Recent Labs  Lab 10/12/24 1544 10/12/24 1952 10/12/24 2337 10/13/24 0348 10/13/24 0722  GLUCAP 115* 106* 101* 109* 116*    Surgical History:   Past Surgical History:  Procedure Laterality Date   PULMONARY ANGIOGRAPHY N/A 04/06/2024   Procedure: PULMONARY ANGIOGRAPHY;  Surgeon: Zenaida Morene PARAS, MD;  Location: [facility] INVASIVE CV LAB;  Service: Cardiovascular;  Laterality: N/A;   RIGHT HEART CATH N/A 10/13/2023   Procedure: RIGHT HEART CATH;  Surgeon: Anner Alm ORN, MD;  Location: [facility] INVASIVE CV LAB;   Service: Cardiovascular;  Laterality: N/A;   RIGHT HEART CATH N/A 04/06/2024   Procedure: RIGHT HEART CATH;  Surgeon: Zenaida Morene PARAS, MD;  Location: [facility] INVASIVE CV LAB;  Service: Cardiovascular;  Laterality: N/A;   TONSILLECTOMY  58 yo     Social History:   reports  that he has been smoking cigarettes. He started smoking about 43 years ago. He has a 65 pack-year smoking history. He has never used smokeless tobacco. He reports current alcohol use. He reports current drug use. Drug: Marijuana.   Family History:  His family history includes Asthma in his brother; Diabetes in his father and mother; Heart disease in his mother.   Allergies No Known Allergies   Home Medications  Prior to Admission medications   Medication Sig Start Date End Date Taking? Authorizing Provider  ADEMPAS  2.5 MG TABS Take 1 tablet by mouth 3 (three) times daily. 09/21/24  Yes Zenaida Morene PARAS, MD  albuterol  (PROAIR  HFA) 108 [phone] Base) MCG/ACT inhaler 2 puffs every 4 hours as needed only  if your can't catch your breath 11/08/23  Yes Darlean Ozell NOVAK, MD  apixaban  (ELIQUIS ) 5 MG TABS tablet Take 1 tablet (5 mg total) by mouth 2 (two) times daily. 09/26/24  Yes Zenaida Morene PARAS, MD  Aspirin -Caffeine (BAYER BACK & BODY PO) Take 1 tablet by mouth daily as needed (pain).   Yes [provider]  famotidine-calcium carbonate-magnesium  hydroxide (PEPCID COMPLETE) 10-800-165 MG chewable tablet Chew 1 tablet by mouth daily as needed (acid reflux).   Yes [provider]  mometasone -formoterol  (DULERA ) 100-5 MCG/ACT AERO Inhale 2 puffs into the lungs 2 (two) times daily. 01/17/24  Yes Milian, Marry Lenis, FNP  Riociguat  (ADEMPAS ) 1 MG TABS Take 1 mg by mouth in the morning, at noon, and at bedtime. 08/25/24  Yes Zenaida Morene PARAS, MD  tiotropium (SPIRIVA ) 18 MCG inhalation capsule Place 1 capsule (18 mcg total) into inhaler and inhale daily. 05/10/24  Yes Darlean Ozell NOVAK, MD  torsemide  (DEMADEX ) 20 MG tablet  Take 10 mg by mouth every other day.   Yes [provider]  aspirin -sod bicarb-citric acid (ALKA-SELTZER) 325 MG TBEF tablet Take 650 mg by mouth every 6 (six) hours as needed (indigestion).    [provider]  Dihydroxyaluminum Sod Carb (ROLAIDS PO) Take 1 tablet by mouth daily as needed (acid reflux).    [provider]     Critical care time: 63     Candiss Galeana, DO Internal Medicine Resident, PGY-1 Please contact the on call pager at [phone] for any urgent or emergent needs. 9:37 AM 10/13/2024

## 2024-10-13 NOTE — Progress Notes (Signed)
 eLink Physician-Brief Progress Note Patient Name: EUSEBIO BLAZEJEWSKI DOB: Jun 24, 1966 MRN: [id number]   Date of Service  10/13/2024  HPI/Events of Note  Requesting his home inhalers rather than nebulized treatments  eICU Interventions  Resume home Spiriva  and Dulera , maintain as needed albuterol  nebs rather than ProAir    0444 - Kcl  Intervention Category Minor Interventions: Routine modifications to care plan (e.g. PRN medications for pain, fever)  Ayiana Winslett 10/13/2024, 11:29 PM

## 2024-10-14 LAB — GLUCOSE, CAPILLARY
Glucose-Capillary: 102 mg/dL — ABNORMAL HIGH (ref 70–99)
Glucose-Capillary: 104 mg/dL — ABNORMAL HIGH (ref 70–99)
Glucose-Capillary: 118 mg/dL — ABNORMAL HIGH (ref 70–99)
Glucose-Capillary: 119 mg/dL — ABNORMAL HIGH (ref 70–99)
Glucose-Capillary: 134 mg/dL — ABNORMAL HIGH (ref 70–99)
Glucose-Capillary: 147 mg/dL — ABNORMAL HIGH (ref 70–99)
Glucose-Capillary: 201 mg/dL — ABNORMAL HIGH (ref 70–99)

## 2024-10-14 LAB — CBC
HCT: 28 % — ABNORMAL LOW (ref 39.0–52.0)
Hemoglobin: 8.3 g/dL — ABNORMAL LOW (ref 13.0–17.0)
MCH: 24.1 pg — ABNORMAL LOW (ref 26.0–34.0)
MCHC: 29.6 g/dL — ABNORMAL LOW (ref 30.0–36.0)
MCV: 81.4 fL (ref 80.0–100.0)
Platelets: 494 K/uL — ABNORMAL HIGH (ref 150–400)
RBC: 3.44 MIL/uL — ABNORMAL LOW (ref 4.22–5.81)
RDW: 24.7 % — ABNORMAL HIGH (ref 11.5–15.5)
WBC: 18.3 K/uL — ABNORMAL HIGH (ref 4.0–10.5)
nRBC: 0 % (ref 0.0–0.2)

## 2024-10-14 LAB — HEMOGLOBIN AND HEMATOCRIT, BLOOD
HCT: 33.5 % — ABNORMAL LOW (ref 39.0–52.0)
HCT: 35.8 % — ABNORMAL LOW (ref 39.0–52.0)
Hemoglobin: 9.9 g/dL — ABNORMAL LOW (ref 13.0–17.0)
Hemoglobin: 10.1 g/dL — ABNORMAL LOW (ref 13.0–17.0)

## 2024-10-14 LAB — MAGNESIUM: Magnesium: 2 mg/dL (ref 1.7–2.4)

## 2024-10-14 LAB — BASIC METABOLIC PANEL WITH GFR
Anion gap: 6 (ref 5–15)
BUN: 8 mg/dL (ref 6–20)
CO2: 24 mmol/L (ref 22–32)
Calcium: 7.6 mg/dL — ABNORMAL LOW (ref 8.9–10.3)
Chloride: 108 mmol/L (ref 98–111)
Creatinine, Ser: 0.64 mg/dL (ref 0.61–1.24)
GFR, Estimated: >60 mL/min (ref >60)
Glucose, Bld: 140 mg/dL — ABNORMAL HIGH (ref 70–99)
Potassium: 3.6 mmol/L (ref 3.5–5.1)
Sodium: 138 mmol/L (ref 135–145)

## 2024-10-14 MED ORDER — POTASSIUM CHLORIDE 10 MEQ/100ML IV SOLN
10.0000 meq | INTRAVENOUS | Status: AC
  Administered 2024-10-14 (×4): 10 meq via INTRAVENOUS
  Filled 2024-10-14 (×4): qty 100

## 2024-10-14 MED ORDER — FLUTICASONE FUROATE-VILANTEROL 100-25 MCG/ACT IN AEPB
1.0000 | INHALATION_SPRAY | Freq: Every day | RESPIRATORY_TRACT | Status: DC
  Administered 2024-10-15 – 2024-10-16 (×2): 1 via RESPIRATORY_TRACT
  Filled 2024-10-14: qty 28

## 2024-10-14 NOTE — Evaluation (Signed)
 Physical Therapy Evaluation  Patient Details Name: Alan [name] MRN: [id number] DOB: 12-Oct-1966 Today's Date: 10/14/2024  History of Present Illness  Pt is a 58 y/o male who presents to [facility] 10/10/2024 for N/V and abdominal pain. Progressed to bloody emesis and pt was found to have GI bleed (vertical tear of esophagus with significant hemorrhage). Transferred to [facility] 12/3. Intubated 12/3-12/5. PMH significant for COPD, chronic pulmonary HTN, history of ETOH abuse, PE.   Clinical Impression  Pt admitted with above diagnosis. Pt currently with functional limitations due to the deficits listed below (see PT Problem List). At the time of PT eval pt was able to perform transfers and in-room ambulation with up to min assist and RW for support. Pt lives alone however has a good friend who would be available to assist him at d/c if needed. Recommend OOB for meals and ambulation with mobility specialists or nursing staff between PT sessions. Anticipate pt will progress well if he continues mobilizing while admitted. Pt will benefit from acute skilled PT to increase their independence and safety with mobility to allow discharge.           If plan is discharge home, recommend the following: A little help with walking and/or transfers;A little help with bathing/dressing/bathroom;Assistance with cooking/housework;Assist for transportation;Help with stairs or ramp for entrance   Can travel by private vehicle        Equipment Recommendations Rolling walker (2 wheels);BSC/3in1  Recommendations for Other Services       Functional Status Assessment Patient has had a recent decline in their functional status and demonstrates the ability to make significant improvements in function in a reasonable and predictable amount of time.     Precautions / Restrictions Precautions Precautions: Fall Recall of Precautions/Restrictions: Intact Restrictions Weight Bearing Restrictions Per Provider Order: No       Mobility  Bed Mobility Overal bed mobility: Needs Assistance Bed Mobility: Supine to Sit     Supine to sit: Min assist     General bed mobility comments: Light assist to transition to EOB. HOB elevated. Increased time to transition out to EOB.    Transfers Overall transfer level: Needs assistance Equipment used: Rolling walker (2 wheels) Transfers: Sit to/from Stand Sit to Stand: Contact guard assist           General transfer comment: VC's for hand placement on seated surface for safety. Pt was able to power up to full stand without physical assist.    Ambulation/Gait Ambulation/Gait assistance: Contact guard assist, +2 safety/equipment Gait Distance (Feet): 10 Feet Assistive device: Rolling walker (2 wheels) Gait Pattern/deviations: Step-through pattern, Decreased stride length, Trunk flexed, Narrow base of support Gait velocity: Decreased Gait velocity interpretation: <1.8 ft/sec, indicate of risk for recurrent falls   General Gait Details: Slow but appears steady with the RW for support. Pt ambulated around the bed to the chair. SpO2 down to 86% on 4L/min supplemental O2. Rebounded to 90% within 30 seconds of sitting and demonstrating pursed-lip breathing.  Stairs            Wheelchair Mobility     Tilt Bed    Modified Rankin (Stroke Patients Only)       Balance Overall balance assessment: Needs assistance Sitting-balance support: Feet supported, No upper extremity supported Sitting balance-Leahy Scale: Fair     Standing balance support: Bilateral upper extremity supported, During functional activity, Reliant on assistive device for balance Standing balance-Leahy Scale: Poor  Pertinent Vitals/Pain Pain Assessment Pain Assessment: No/denies pain    Home Living Family/patient expects to be discharged to:: Private residence Living Arrangements: Alone Available Help at Discharge: Friend(s);Available  PRN/intermittently Type of Home: Apartment Home Access: Level entry       Home Layout: One level Home Equipment: Cane - single point;Grab bars - tub/shower;Grab bars - toilet      Prior Function Prior Level of Function : History of Falls (last six months);Independent/Modified Independent             Mobility Comments: a few here and there when asked about falls - pt reports due to baseline knee issues. Uses the cane all the time.       Extremity/Trunk Assessment   Upper Extremity Assessment Upper Extremity Assessment: Generalized weakness    Lower Extremity Assessment Lower Extremity Assessment: RLE deficits/detail;LLE deficits/detail;Generalized weakness RLE Deficits / Details: Pt reports baseline deficits. Difficulty with knee extension (has PROM but not AROM in sitting); but with active hip flexion. LLE Deficits / Details: Grossly 4-/5 in quads, hamstrings, hip flexors    Cervical / Trunk Assessment Cervical / Trunk Assessment: Other exceptions Cervical / Trunk Exceptions: Forward head posture with rounded shoulders  Communication   Communication Communication: No apparent difficulties    Cognition Arousal: Alert Behavior During Therapy: WFL for tasks assessed/performed   PT - Cognitive impairments: No apparent impairments                         Following commands: Intact       Cueing Cueing Techniques: Verbal cues, Gestural cues     General Comments      Exercises General Exercises - Lower Extremity Ankle Circles/Pumps: 10 reps, AROM, Both Long Arc Quad: 10 reps, AROM, Both   Assessment/Plan    PT Assessment Patient needs continued PT services  PT Problem List Decreased strength;Decreased activity tolerance;Decreased balance;Decreased mobility;Decreased knowledge of use of DME;Decreased safety awareness;Decreased knowledge of precautions;Cardiopulmonary status limiting activity       PT Treatment Interventions DME instruction;Gait  training;Stair training;Functional mobility training;Therapeutic activities;Therapeutic exercise;Balance training;Patient/family education    PT Goals (Current goals can be found in the Care Plan section)  Acute Rehab PT Goals Patient Stated Goal: Be able to return to his apartment at d/c PT Goal Formulation: With patient Time For Goal Achievement: 10/21/24 Potential to Achieve Goals: Good    Frequency Min 2X/week     Co-evaluation               AM-PAC PT 6 Clicks Mobility  Outcome Measure Help needed turning from your back to your side while in a flat bed without using bedrails?: A Little Help needed moving from lying on your back to sitting on the side of a flat bed without using bedrails?: A Little Help needed moving to and from a bed to a chair (including a wheelchair)?: A Little Help needed standing up from a chair using your arms (e.g., wheelchair or bedside chair)?: A Little Help needed to walk in hospital room?: A Little Help needed climbing 3-5 steps with a railing? : A Lot 6 Click Score: 17    End of Session Equipment Utilized During Treatment: Gait belt;Oxygen Activity Tolerance: Patient tolerated treatment well Patient left: in chair;with call bell/phone within reach;with chair alarm set Nurse Communication: Mobility status PT Visit Diagnosis: Unsteadiness on feet (R26.81);Difficulty in walking, not elsewhere classified (R26.2)    Time: 9099-9072 PT Time Calculation (min) (ACUTE ONLY): 27 min  Charges:   PT Evaluation $PT Eval Moderate Complexity: 1 Mod PT Treatments $Gait Training: 8-22 mins PT General Charges $$ ACUTE PT VISIT: 1 Visit         Leita Sable, PT, DPT Acute Rehabilitation Services Secure Chat Preferred Office: [phone]   Leita JONETTA Sable 10/14/2024, 11:14 AM

## 2024-10-14 NOTE — Progress Notes (Addendum)
 NAME:  Alan [name], MRN:  [id number], DOB:  1966/08/08, LOS: 3 ADMISSION DATE:  10/10/2024, CONSULTATION DATE:  12/3 REFERRING MD:  Marston CHIEF COMPLAINT:  Hematemesis   History of Present Illness:  Alan [name] is a 58 year old man with a past medical history of CTEPH, hx of PE 08/2023 (on Eliquis ), COPD, anemia, EtOH use, tobacco use who presented to [facility] 12/3 as a transfer from [facility] for hematemesis.   Patient initially presented to [facility] 12/2PM for nausea, vomiting, acid reflux and abdominal pain after eating seafood at a restaurant. He then progressed to bloody emesis after several episodes of vomiting, and had associated chest and epigastrium discomfort. On ED presentation, patient was afebrile with HR 107, BP 108/72, RR 15, SpO2 99% on RA. Labs were notable for leukocytosis of 18, critically low Hgb 4.7, Plt 588. INR 1.2. Trop 33 > 20. Iron studies unremarkable. After receiving 3u pRBCs, Hgb 8.0. Despite transfusion and IVF, patient continued to vomit a small amount of bright red blood, causing increasing concern due to active anticoagulation with home Eliquis .   CXR demonstrated R lateral midlung pleural thickening. CTA GIB Protocol with intraluminal contrast noted in the distal esophagus/gastric cardia, potential venous hemorrhage vs. GE varices, findings concerning for esophagitis. Kcentra  was administered for AC reversal given life-threatening bleeding. The patient was started on Ceftriaxone , octreotide  gtt, Protonix  and Zofran  in the [facility] ED. GI was urgently consulted and patient was intubated for EGD on 12/3. EGD findings included severe Mallory-Weiss tear with significant hemorrhage; Hemospray was applied. CT Chest demonstrated high-density material c/w hemospray during EGD, no evidence of full-thickness perforation but abnormal esophageal dilation with intraluminal frothy content concerning for mediastinitis, evidence of emphysema and airway thickening with plugging of bilateral  lower lobe bronchi, small bilateral pleural effusions. Broad-spectrum antibiotics started for potential mediastinitis.    Patient was subsequently transferred to [facility] for further evaluation by GI and TCTS and management. PCCM consulted for ICU admission.  Pertinent  Medical History   Past Medical History:  Diagnosis Date   Anemia    COPD (chronic obstructive pulmonary disease) (HCC)    CTEPH (chronic thromboembolic pulmonary hypertension) (HCC)    History of ETOH abuse    Pulmonary embolism (HCC)    On Eliquis    Pulmonary hypertension (HCC)    Right knee pain    Right shoulder pain    Significant Hospital Events: Including procedures, antibiotic start and stop dates in addition to other pertinent events   12/2- Presented to [facility] ED with N/V, abdominal pain. Multiple episodes of emesis with development of hematemesis . Hgb 4.7. 3u pRBCs transfused. CTA showing GIB with intraluminal contrast noted in the distal esophagus/gastric cardia, possible venous hemorrhage vs GE varices 12/3- GI consulted. Intubated for EGD, EGD completed, positive for extensive mucosal tearing (Mallory-Weiss tear) with significant hemorrhage, occlusion with clot, hemospray applied. CT chest without evidence of full-thickness perforation. Transferred to [facility] for ICU admission/TCTS and PCCM evaluation and management. 12/3- PCCM consulted for ICU admission/management 12/05- extubated  Interim History / Subjective:  No overnight events.  Tolerating oral diets H/h remains stable  Objective    Blood pressure 114/81, pulse 92, temperature 98.7 F (37.1 C), temperature source Oral, resp. rate 20, height 5' 9 (1.753 m), weight 51.1 kg, SpO2 98%.    Vent Mode: PSV;CPAP FiO2 (%):  [30 %] 30 % PEEP:  [5 cmH20] 5 cmH20 Pressure Support:  [5 cmH20] 5 cmH20   Intake/Output Summary (Last 24 hours) at 10/14/2024 0943 Last  data filed at 10/14/2024 0800 Gross per 24 hour  Intake 1469.11 ml  Output 825 ml  Net  644.11 ml   Filed Weights   10/10/24 2347 10/11/24 0500  Weight: 56.7 kg 51.1 kg    Examination: General: pleasant male, NAD HENT: [place]/AT, anicteric sclera, PERRLA, moist mucous membranes, scant dried blood around bilateral nares Lungs: normal BS Cardiovascular: RRR, no murmur/rub/gallops Abdomen: soft, nontender, nondistended, bowel sounds positive Extremities: warm, dry, no edema Neuro: awake, following commands    Resolved problem list   Assessment and Plan   Patient initially presented to [facility] 12/2PM for nausea, vomiting, acid reflux and abdominal pain,Progressed to bloody emesis after several episodes of vomiting with associated chest discomfort. Found to have GI bleed on CTA GIB and findings concerning for esophagitis.  Hx of HFpEF, PE on eliquis , PH/CTEPH on Adempas  (follows with Dr Zenaida), COPD. Reversed with Kcentra , EGD at [facility] showed Mallory-Weiss tear with significant hemorrhage; Hemospray was applied.  Transferred to [facility] 12/03 early morning     Acute blood loss anemia Mallory weiss tear Mediastinitis suspected- ruled out Hx of PE, CTEPH on adempas  Alcohol use history    - continue empiric ceftriaxone - no growth on c/w so far- can stop after 5 days - trend H/H - no evidence of further bleed anymore - no plans for EGD as per GI - thiamine  and folate  - watch for withdrawls - home dulera  and spiriva  - resume home riociguat - pt's family to bring it today - would resume DVT tomorrow if okay with GI- was put on eliquis  for high probability V/Q scan by cardiology in 09/2023 as a part of pulm HTH/CTEPH work up  Transfer out of floor    Labs   CBC: Recent Labs  Lab 10/11/24 0030 10/11/24 0757 10/12/24 0513 10/12/24 0823 10/12/24 1113 10/13/24 0411 10/13/24 0814 10/13/24 1229 10/13/24 1654 10/13/24 1958 10/14/24 0225  WBC 18.0*  --  14.4* 14.1*  --  17.9*  --   --   --   --  18.3*  HGB 4.7*   < > 8.4* 8.1*   < > 9.7* 8.9* 9.8* 9.4* 9.0* 8.3*  HCT 17.4*    < > 26.2* 25.6*   < > 31.8* 29.3* 32.9* 31.9* 30.6* 28.0*  MCV 73.7*  --  75.1* 75.3*  --  79.9*  --   --   --   --  81.4  PLT 588*  --  504* 511*  --  441*  --   --   --   --  494*   < > = values in this interval not displayed.    Basic Metabolic Panel: Recent Labs  Lab 10/11/24 0030 10/11/24 0757 10/11/24 2150 10/12/24 0513 10/13/24 0411 10/14/24 0225  NA 136  --  136 137 141 138  K 4.3  --  3.6 3.6 3.7 3.6  CL 101  --   --  103 104 108  CO2 22  --   --  27 23 24   GLUCOSE 111*  --   --  146* 121* 140*  BUN 11  --   --  7 7 8   CREATININE 0.83  --   --  0.79 0.81 0.64  CALCIUM 8.6*  --   --  7.6* 7.8* 7.6*  MG  --  2.4  --   --  2.4 2.0  PHOS  --  3.4  --   --  2.8  --    GFR: Estimated Creatinine Clearance: 72.7  mL/min (by C-G formula based on SCr of 0.64 mg/dL). Recent Labs  Lab 10/12/24 0513 10/12/24 0823 10/12/24 1736 10/12/24 2012 10/13/24 0411 10/14/24 0225  WBC 14.4* 14.1*  --   --  17.9* 18.3*  LATICACIDVEN  --   --  1.8 1.5  --   --     Liver Function Tests: Recent Labs  Lab 10/11/24 0030 10/12/24 0513 10/13/24 0411  AST 17 13* 14*  ALT 9 11 9   ALKPHOS 106 67 62  BILITOT 0.2 0.8 0.4  PROT 7.0 5.5* 5.6*  ALBUMIN  3.8 2.1* 2.0*   Recent Labs  Lab 10/11/24 0030  LIPASE 17   ABG    Component Value Date/Time   PHART 7.497 (H) 10/11/2024 2150   PCO2ART 33.8 10/11/2024 2150   PO2ART 122 (H) 10/11/2024 2150   HCO3 26.1 10/11/2024 2150   TCO2 27 10/11/2024 2150   O2SAT 99 10/11/2024 2150     Coagulation Profile: Recent Labs  Lab 10/11/24 0030  INR 1.2   CBG: Recent Labs  Lab 10/13/24 1554 10/13/24 1954 10/14/24 0010 10/14/24 0419 10/14/24 0723  GLUCAP 106* 129* 118* 102* 119*    Surgical History:   Past Surgical History:  Procedure Laterality Date   ESOPHAGOGASTRODUODENOSCOPY N/A 10/11/2024   Procedure: EGD (ESOPHAGOGASTRODUODENOSCOPY);  Surgeon: Shaaron Lamar HERO, MD;  Location: [facility];  Service: Endoscopy;  Laterality:  N/A;   PULMONARY ANGIOGRAPHY N/A 04/06/2024   Procedure: PULMONARY ANGIOGRAPHY;  Surgeon: Zenaida Morene PARAS, MD;  Location: [facility] INVASIVE CV LAB;  Service: Cardiovascular;  Laterality: N/A;   RIGHT HEART CATH N/A 10/13/2023   Procedure: RIGHT HEART CATH;  Surgeon: Anner Alm ORN, MD;  Location: [facility] INVASIVE CV LAB;  Service: Cardiovascular;  Laterality: N/A;   RIGHT HEART CATH N/A 04/06/2024   Procedure: RIGHT HEART CATH;  Surgeon: Zenaida Morene PARAS, MD;  Location: [facility] INVASIVE CV LAB;  Service: Cardiovascular;  Laterality: N/A;   TONSILLECTOMY  58 yo     Social History:   reports that he has been smoking cigarettes. He started smoking about 43 years ago. He has a 65 pack-year smoking history. He has never used smokeless tobacco. He reports current alcohol use. He reports current drug use. Drug: Marijuana.   Family History:  His family history includes Asthma in his brother; Diabetes in his father and mother; Heart disease in his mother.   Allergies No Known Allergies   Home Medications  Prior to Admission medications   Medication Sig Start Date End Date Taking? Authorizing Provider  ADEMPAS  2.5 MG TABS Take 1 tablet by mouth 3 (three) times daily. 09/21/24  Yes Zenaida Morene PARAS, MD  albuterol  (PROAIR  HFA) 108 [phone] Base) MCG/ACT inhaler 2 puffs every 4 hours as needed only  if your can't catch your breath 11/08/23  Yes Darlean Ozell NOVAK, MD  apixaban  (ELIQUIS ) 5 MG TABS tablet Take 1 tablet (5 mg total) by mouth 2 (two) times daily. 09/26/24  Yes Zenaida Morene PARAS, MD  Aspirin -Caffeine (BAYER BACK & BODY PO) Take 1 tablet by mouth daily as needed (pain).   Yes [provider]  famotidine-calcium carbonate-magnesium  hydroxide (PEPCID COMPLETE) 10-800-165 MG chewable tablet Chew 1 tablet by mouth daily as needed (acid reflux).   Yes [provider]  mometasone -formoterol  (DULERA ) 100-5 MCG/ACT AERO Inhale 2 puffs into the lungs 2 (two) times daily. 01/17/24  Yes Milian,  Marry Lenis, FNP  Riociguat  (ADEMPAS ) 1 MG TABS Take 1 mg by mouth in the morning, at noon, and  at bedtime. 08/25/24  Yes Zenaida Morene PARAS, MD  tiotropium (SPIRIVA ) 18 MCG inhalation capsule Place 1 capsule (18 mcg total) into inhaler and inhale daily. 05/10/24  Yes Darlean Ozell NOVAK, MD  torsemide  (DEMADEX ) 20 MG tablet Take 10 mg by mouth every other day.   Yes [provider]  aspirin -sod bicarb-citric acid (ALKA-SELTZER) 325 MG TBEF tablet Take 650 mg by mouth every 6 (six) hours as needed (indigestion).    [provider]  Dihydroxyaluminum Sod Carb (ROLAIDS PO) Take 1 tablet by mouth daily as needed (acid reflux).    [provider]     Critical care time: 2     Desma Wilkowski,MD PCCM Please contact the on call pager at [phone] for any urgent or emergent needs. 9:43 AM 10/14/2024

## 2024-10-15 DIAGNOSIS — E43 Unspecified severe protein-calorie malnutrition: Secondary | ICD-10-CM

## 2024-10-15 DIAGNOSIS — D62 Acute posthemorrhagic anemia: Secondary | ICD-10-CM | POA: Insufficient documentation

## 2024-10-15 DIAGNOSIS — K226 Gastro-esophageal laceration-hemorrhage syndrome: Secondary | ICD-10-CM

## 2024-10-15 DIAGNOSIS — I2724 Chronic thromboembolic pulmonary hypertension: Secondary | ICD-10-CM

## 2024-10-15 LAB — BASIC METABOLIC PANEL WITH GFR
Anion gap: 7 (ref 5–15)
BUN: <5 mg/dL — ABNORMAL LOW (ref 6–20)
CO2: 23 mmol/L (ref 22–32)
Calcium: 7.6 mg/dL — ABNORMAL LOW (ref 8.9–10.3)
Chloride: 109 mmol/L (ref 98–111)
Creatinine, Ser: 0.53 mg/dL — ABNORMAL LOW (ref 0.61–1.24)
GFR, Estimated: >60 mL/min (ref >60)
Glucose, Bld: 111 mg/dL — ABNORMAL HIGH (ref 70–99)
Potassium: 4 mmol/L (ref 3.5–5.1)
Sodium: 139 mmol/L (ref 135–145)

## 2024-10-15 LAB — TYPE AND SCREEN
ABO/RH(D): A POS
Antibody Screen: NEGATIVE
Unit division: 0
Unit division: 0
Unit division: 0
Unit division: 0
Unit division: 0
Unit division: 0

## 2024-10-15 LAB — GLUCOSE, CAPILLARY
Glucose-Capillary: 110 mg/dL — ABNORMAL HIGH (ref 70–99)
Glucose-Capillary: 116 mg/dL — ABNORMAL HIGH (ref 70–99)
Glucose-Capillary: 127 mg/dL — ABNORMAL HIGH (ref 70–99)

## 2024-10-15 LAB — CBC
HCT: 26.9 % — ABNORMAL LOW (ref 39.0–52.0)
Hemoglobin: 8.1 g/dL — ABNORMAL LOW (ref 13.0–17.0)
MCH: 24.3 pg — ABNORMAL LOW (ref 26.0–34.0)
MCHC: 30.1 g/dL (ref 30.0–36.0)
MCV: 80.5 fL (ref 80.0–100.0)
Platelets: 469 K/uL — ABNORMAL HIGH (ref 150–400)
RBC: 3.34 MIL/uL — ABNORMAL LOW (ref 4.22–5.81)
RDW: 24.1 % — ABNORMAL HIGH (ref 11.5–15.5)
WBC: 16.4 K/uL — ABNORMAL HIGH (ref 4.0–10.5)
nRBC: 0 % (ref 0.0–0.2)

## 2024-10-15 LAB — BPAM RBC
Blood Product Expiration Date: 202512052359
Blood Product Expiration Date: 202512192359
Blood Product Expiration Date: 202512192359
Blood Product Expiration Date: 202512192359
Blood Product Expiration Date: 202512222359
Blood Product Expiration Date: 202512222359
ISSUE DATE / TIME: 202512030128
ISSUE DATE / TIME: 202512030209
ISSUE DATE / TIME: 202512030406
ISSUE DATE / TIME: 202512030848
Unit Type and Rh: 600
Unit Type and Rh: 6200
Unit Type and Rh: 6200
Unit Type and Rh: 6200
Unit Type and Rh: 6200
Unit Type and Rh: 6200

## 2024-10-15 LAB — CULTURE, RESPIRATORY W GRAM STAIN: Culture: NO GROWTH

## 2024-10-15 LAB — HEMOGLOBIN AND HEMATOCRIT, BLOOD
HCT: 29.8 % — ABNORMAL LOW (ref 39.0–52.0)
Hemoglobin: 8.7 g/dL — ABNORMAL LOW (ref 13.0–17.0)

## 2024-10-15 LAB — MAGNESIUM: Magnesium: 1.9 mg/dL (ref 1.7–2.4)

## 2024-10-15 MED ORDER — MAGNESIUM SULFATE 2 GM/50ML IV SOLN
2.0000 g | Freq: Once | INTRAVENOUS | Status: AC
  Administered 2024-10-15: 2 g via INTRAVENOUS
  Filled 2024-10-15: qty 50

## 2024-10-15 MED ORDER — FOLIC ACID 1 MG PO TABS
1.0000 mg | ORAL_TABLET | Freq: Every day | ORAL | Status: DC
  Administered 2024-10-16: 1 mg via ORAL
  Filled 2024-10-15: qty 1

## 2024-10-15 MED ORDER — HEPARIN SODIUM (PORCINE) 5000 UNIT/ML IJ SOLN
5000.0000 [IU] | Freq: Three times a day (TID) | INTRAMUSCULAR | Status: DC
  Administered 2024-10-15 – 2024-10-16 (×4): 5000 [IU] via SUBCUTANEOUS
  Filled 2024-10-15 (×4): qty 1

## 2024-10-15 NOTE — Progress Notes (Signed)
 [facility] ADULT ICU REPLACEMENT PROTOCOL   The patient does apply for the [facility] Adult ICU Electrolyte Replacment Protocol based on the criteria listed below:   1.Exclusion criteria: TCTS, ECMO, Dialysis, and Myasthenia Gravis patients 2. Is GFR >/= 30 ml/min? Yes.    Patient's GFR today is >60 3. Is SCr </= 2? Yes.   Patient's SCr is 0.53 mg/dL 4. Did SCr increase >/= 0.5 in 24 hours? No. 5.Pt's weight >40kg  Yes.   6. Abnormal electrolyte(s): Mag 1.9  7. Electrolytes replaced per protocol 8.  Call MD STAT for K+ </= 2.5, Phos </= 1, or Mag </= 1 Physician:  Dr. Haze Rummer, Recardo ORN 10/15/2024 5:40 AM

## 2024-10-15 NOTE — Evaluation (Signed)
 Occupational Therapy Evaluation Patient Details Name: Alan [name] MRN: [id number] DOB: Feb 15, 1966 Today's Date: 10/15/2024   History of Present Illness   Pt is a 58 y/o male who presents to [facility] 10/10/2024 for N/V and abdominal pain. Progressed to bloody emesis and pt was found to have GI bleed (vertical tear of esophagus with significant hemorrhage). Transferred to [facility] 12/3. Intubated 12/3-12/5. PMH significant for COPD, chronic pulmonary HTN, history of ETOH abuse, PE.     Clinical Impressions PTA, pt lives alone and typically Modified Independent with ADLs, IADLs and mobility using cane. Pt presents now with deficits in cardiopulmonary endurance and dynamic standing balance. With use of RW, pt able to ambulate around unit with CGA and one standing rest break d/t desat to 86% on 6 L O2. Pt able to manage ADLs with Setup assist. Anticipate no OT needs upon DC but will continue to follow acutely with an emphasis on energy conservation and gradual endurance retraining for daily routine tasks.     If plan is discharge home, recommend the following:   Other (comment) (PRN)     Functional Status Assessment   Patient has had a recent decline in their functional status and demonstrates the ability to make significant improvements in function in a reasonable and predictable amount of time.     Equipment Recommendations   Tub/shower seat     Recommendations for Other Services         Precautions/Restrictions   Precautions Precautions: Fall Recall of Precautions/Restrictions: Intact Precaution/Restrictions Comments: monitor O2 Restrictions Weight Bearing Restrictions Per Provider Order: No     Mobility Bed Mobility Overal bed mobility: Modified Independent                  Transfers Overall transfer level: Modified independent Equipment used: Rolling walker (2 wheels) Transfers: Sit to/from Stand                    Balance Overall balance  assessment: Needs assistance Sitting-balance support: No upper extremity supported, Feet supported Sitting balance-Leahy Scale: Normal     Standing balance support: Bilateral upper extremity supported, No upper extremity supported, During functional activity Standing balance-Leahy Scale: Fair                             ADL either performed or assessed with clinical judgement   ADL Overall ADL's : Needs assistance/impaired Eating/Feeding: Independent   Grooming: Set up;Standing   Upper Body Bathing: Set up   Lower Body Bathing: Set up   Upper Body Dressing : Set up   Lower Body Dressing: Set up   Toilet Transfer: Contact guard assist;Ambulation;Rolling walker (2 wheels)   Toileting- Clothing Manipulation and Hygiene: Sitting/lateral lean;Sit to/from stand;Set up       Functional mobility during ADLs: Contact guard assist;Rolling walker (2 wheels)       Vision Ability to See in Adequate Light: 0 Adequate Patient Visual Report: No change from baseline Vision Assessment?: No apparent visual deficits     Perception         Praxis         Pertinent Vitals/Pain Pain Assessment Pain Assessment: No/denies pain     Extremity/Trunk Assessment Upper Extremity Assessment Upper Extremity Assessment: Overall WFL for tasks assessed;Right hand dominant   Lower Extremity Assessment Lower Extremity Assessment: Defer to PT evaluation   Cervical / Trunk Assessment Cervical / Trunk Assessment: Normal   Communication Communication Communication: No  apparent difficulties   Cognition Arousal: Alert Behavior During Therapy: WFL for tasks assessed/performed Cognition: No apparent impairments                               Following commands: Intact       Cueing  General Comments   Cueing Techniques: Verbal cues;Gestural cues      Exercises     Shoulder Instructions      Home Living Family/patient expects to be discharged to:: Private  residence Living Arrangements: Alone Available Help at Discharge: Friend(s);Available PRN/intermittently Type of Home: Apartment Home Access: Level entry     Home Layout: One level     Bathroom Shower/Tub: Chief Strategy Officer: Standard Bathroom Accessibility: Yes   Home Equipment: Cane - single point;Grab bars - tub/shower;Grab bars - toilet          Prior Functioning/Environment Prior Level of Function : History of Falls (last six months);Independent/Modified Independent             Mobility Comments: a few here and there when asked about falls - pt reports due to baseline knee issues. Uses the cane all the time. ADLs Comments: Indep with ADLs, IADLs    OT Problem List: Decreased strength;Decreased activity tolerance;Impaired balance (sitting and/or standing);Cardiopulmonary status limiting activity   OT Treatment/Interventions: Self-care/ADL training;Therapeutic exercise;Energy conservation;DME and/or AE instruction;Therapeutic activities;Patient/family education;Balance training      OT Goals(Current goals can be found in the care plan section)   Acute Rehab OT Goals Patient Stated Goal: home soon, be able to wean off of O2, stop smoking OT Goal Formulation: With patient Time For Goal Achievement: 10/29/24 Potential to Achieve Goals: Good   OT Frequency:  Min 2X/week    Co-evaluation              AM-PAC OT 6 Clicks Daily Activity     Outcome Measure Help from another person eating meals?: None Help from another person taking care of personal grooming?: None Help from another person toileting, which includes using toliet, bedpan, or urinal?: A Little Help from another person bathing (including washing, rinsing, drying)?: A Little Help from another person to put on and taking off regular upper body clothing?: A Little Help from another person to put on and taking off regular lower body clothing?: A Little 6 Click Score: 20   End of  Session Equipment Utilized During Treatment: Gait belt;Rolling walker (2 wheels);Oxygen Nurse Communication: Mobility status  Activity Tolerance: Patient tolerated treatment well Patient left: in chair;with call bell/phone within reach;with chair alarm set  OT Visit Diagnosis: Unsteadiness on feet (R26.81);Other abnormalities of gait and mobility (R26.89);Muscle weakness (generalized) (M62.81)                Time: 9068-9047 OT Time Calculation (min): 21 min Charges:  OT General Charges $OT Visit: 1 Visit OT Evaluation $OT Eval Moderate Complexity: 1 Mod  Mliss NOVAK, OTR/L Acute Rehab Services Office: [phone]   Mliss Fish 10/15/2024, 10:51 AM

## 2024-10-15 NOTE — Progress Notes (Signed)
 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   Assessment and Plan:  Acute Mallory-Weiss tear - S/p EGD with extensive clots noted through the entire esophagus with longitudinal tear in the distal third.  S/p Hemospray.  Hematemesis appears resolved.  GI signing off 12/5.  Stopped IV octreotide .  PPI twice daily x 6 weeks then PPI once daily thereafter.  Advancing diet as tolerates.  Acute blood loss anemia - Hemoglobin 4.7 on presentation.  S/p 4 units PRBCs total.  Hemoglobin appears stable.  Will recheck CBC in AM.  Acute hypoxic respiratory failure - Noted hypoxia with ambulation, intermittently on 6 L nasal cannula.  Appears improved this morning down to 2 L.  Continue to wean O2 as tolerated.  Concern for mediastinitis/pneumonia - Continues on ceftriaxone .  Showing improvement in leukocytosis.  CTEPH/history of PE - Previously on anticoagulation.  Obviously on hold for now.  Will likely restart on discharge.  History of alcohol use - Currently on CIWA.  Vitamin supplementation.  Tobacco use - Encourage cessation.  Physical debilitation muscle weakness - Will order PT/OT.  Subjective: Patient sitting up in bed, feeling improved.  Denies any fever, chills, chest pain, shortness of breath, nausea, vomiting, abdominal pain.  Denies hematemesis, hematochezia, dark stools.  Eager to advance diet.  Physical Exam:  Vitals:   10/15/24 0600 10/15/24 0700 10/15/24 0758 10/15/24 0819  BP: 99/70 99/69    Pulse: 86 87    Resp: 15 16    Temp:    98.3 F (36.8 C)  TempSrc:    Oral  SpO2: 97% 95% (!) 88%   Weight:      Height:        GENERAL:  Alert, pleasant, no acute  distress  HEENT:  EOMI, nasal cannula CARDIOVASCULAR:  RRR, no murmurs appreciated RESPIRATORY:  Clear to auscultation, no wheezing, rales, or rhonchi GASTROINTESTINAL:  Soft, nontender, nondistended EXTREMITIES:  No LE edema bilaterally NEURO:  No new focal deficits appreciated SKIN:  No rashes noted PSYCH:  Appropriate mood and affect     Data Reviewed:  Imaging Studies: DG CHEST PORT 1 VIEW Result Date: 10/13/2024 EXAM: 1 VIEW(S) XRAY OF THE CHEST 10/13/2024 05:08:00 AM COMPARISON: 10/12/2024 CLINICAL HISTORY: 01364 Infection 98635; 4361 Mediastinitis 5638 FINDINGS: LINES, TUBES AND DEVICES: Endotracheal tube in place with tip 6.4 cm above the carina. LUNGS AND PLEURA: Bibasilar patchy airspace opacities, right greater than left. Bilateral trace pleural effusions. No pneumothorax. HEART AND MEDIASTINUM: Dilated esophagus is again noted. No acute abnormality of the cardiac and mediastinal silhouettes. BONES AND SOFT TISSUES: Old healed right posterior rib fracture. IMPRESSION: 1. Bibasilar patchy airspace opacities, right greater than left. 2. Bilateral trace pleural effusions. 3. Dilated esophagus. Electronically signed by: Waddell Calk MD 10/13/2024 06:50 AM EST RP Workstation: [id number]   Portable Chest xray Result Date: 10/12/2024 CLINICAL DATA:  Endotracheal tube placement. EXAM: PORTABLE CHEST 1 VIEW COMPARISON:  10/11/2024, chest CT 10/11/2024 FINDINGS: Interval placement of endotracheal tube with tip 6.1 cm above the carina. Lungs are adequately inflated with minimal hazy bibasilar opacification compatible with small layering effusions and atelectasis as seen on recent CT. Slight worsening hazy density over the right base possibly worsening  atelectasis or developing infection. Cardiomediastinal silhouette and remainder of the exam is unchanged. IMPRESSION: 1. Slight worsening hazy density over the right base which may be due to worsening atelectasis or developing infection. 2. Small  layering effusions and bibasilar atelectasis as seen on recent CT. 3. Endotracheal tube with tip 6.1 cm above the carina. Electronically Signed   By: Toribio Agreste M.D.   On: 10/12/2024 08:29   CT CHEST WO CONTRAST Result Date: 10/11/2024 EXAM: CT CHEST WITHOUT CONTRAST 10/11/2024 04:12:40 PM TECHNIQUE: CT of the chest was performed without the administration of intravenous contrast. Multiplanar reformatted images are provided for review. Automated exposure control, iterative reconstruction, and/or weight based adjustment of the mA/kV was utilized to reduce the radiation dose to as low as reasonably achievable. COMPARISON: CT angiogram of the abdomen 10/11/2024 and CTA chest 07/25/2023. CLINICAL HISTORY: Active bleeding into the distal esophagus on CT angiogram of 10/11/2024; EGD performed today showed extensive longitudinal tear in the mid to distal esophagus with extensive clot in the esophagus; hemospray was utilized. FINDINGS: MEDIASTINUM: Heart and pericardium are unremarkable. Thoracic aortic, coronary artery, and branch vessel atheromatous vascular calcifications. 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 The endotracheal tube appears satisfactorily positioned. Diffuse dilation of the esophagus with heterogeneous frothy intraluminal material. Dilated thoracic esophagus. High density material along the wall of the esophagus especially anteriorly and eccentric to the left extending all the way to the proximal stomach, compatible with hemospray administered at the time of EGD. Very indistinct margins between the esophageal wall and surrounding mediastinum with diffuse high density edema in the mediastinum. None of the hemospray is directly observed to extend extraluminal. I cannot exclude esophageal wall thickening as most of the frothy material is centrally within the lumen. No definite extraluminal gas around the esophagus. Administering the patient oral contrast is not readily feasible  given the intubated status. LYMPH NODES: No mediastinal, hilar or axillary lymphadenopathy. LUNGS AND PLEURA: Small bilateral pleural effusions. Emphysema. Airway thickening suggest bronchitis or reactive airways disease. Substantial airway plugging in both lower lobes, with essentially occluded lower lobe bronchi bilaterally. 4 x 3 mm right upper lobe pulmonary nodule on image 65 series 5 stable from 07/15/2023 and considered benign. No focal consolidation or pulmonary edema. No pneumothorax. SOFT TISSUES/BONES: Small amount of gas in the right neck, likely within venous structures and probably related to IV, image 15 series 3. No acute abnormality of the bones or soft tissues. UPPER ABDOMEN: Limited images of the upper abdomen demonstrates vicarious contrast excretion in the gallbladder along with some residual contrast in the renal collecting systems. No acute abnormality. IMPRESSION: 1. The abnormal esophagus appears dilated with substantial frothy intraluminal contents, high-density peripheral material compatible with hemospray administered at egd , and possible wall thickening. No surrounding pneumomediastinum to indicate definite full thickness perforation. 2. Emphysema and airway thickening suspicious for bronchitis or reactive airways disease, with substantial airway plugging occluding both lower lobe bronchi bilaterally. 3. Small bilateral pleural effusions. . 4. Multiple additional chronic and incidental findings, including atherosclerotic vascular calcifications, stable right upper tracheal diverticulum, endotracheal tube in satisfactory position, small amount of presumed venous gas in the right lower neck likely intravascular from IV access, and ancillary contrast-related findings, as detailed in the report body. Electronically signed by: Ryan Salvage MD 10/11/2024 05:15 PM EST RP Workstation: [id number]   CT ANGIO GI BLEED Result Date: 10/11/2024 EXAM: CTA ABDOMEN AND PELVIS WITH CONTRAST  10/11/2024 02:12:44 AM TECHNIQUE: CTA images of the abdomen and pelvis with intravenous contrast.  100 mL of iohexol  (OMNIPAQUE ) 350 MG/ML injection was administered. Three-dimensional MIP/volume rendered formations were performed. Automated exposure control, iterative reconstruction, and/or weight based adjustment of the mA/kV was utilized to reduce the radiation dose to as low as reasonably achievable. COMPARISON: None available. CLINICAL HISTORY: Duodenal ulcer FINDINGS: VASCULATURE: GI BLEED: There is intraluminal contrast within the distal esophagus and gastric cardia seen only on venous phase images which may reflect venous hemorrhage, as could be seen with variceal hemorrhage, or gastroesophageal varices within this region. No other foci of gastrointestinal hemorrhage are identified. AORTA: Mild atherosclerotic calcification within the abdominal aorta. No aneurysm or dissection. CELIAC TRUNK: No acute finding. No occlusion or significant stenosis. SUPERIOR MESENTERIC ARTERY: No acute finding. No occlusion or significant stenosis. INFERIOR MESENTERIC ARTERY: No acute finding. No occlusion or significant stenosis. RENAL ARTERIES: No acute finding. No occlusion or significant stenosis. ILIAC ARTERIES: No acute finding. No occlusion or significant stenosis. ABDOMEN/PELVIS: LOWER CHEST: Visualized portion of the lower chest demonstrates no acute abnormality. LIVER: The liver is unremarkable. GALLBLADDER AND BILE DUCTS: Gallbladder is unremarkable. No biliary ductal dilatation. SPLEEN: The spleen is unremarkable. PANCREAS: The pancreas is unremarkable. ADRENAL GLANDS: Bilateral adrenal glands demonstrate no acute abnormality. KIDNEYS, URETERS AND BLADDER: There is circumferential thickening of the urinary bladder wall, which may relate to changes of chronic bladder outlet obstruction. Marked prostatic hypertrophy. No stones in the kidneys or ureters. No hydronephrosis. No perinephric or periureteral stranding. GI  AND BOWEL: There is thickening of the distal esophagus, which is nonspecific but may relate to underlying esophagitis, such as reflux esophagitis. This could be better assessed with endoscopy if indicated. There is intraluminal contrast within the distal esophagus and gastric cardia seen only on venous phase images which may reflect venous hemorrhage, as could be seen with variceal hemorrhage, or gastroesophageal varices within this region. Moderate stool burden. The stomach, small bowel, and large bowel are otherwise unremarkable. The appendix is not clearly identified on this examination; however, no secondary signs of appendicitis are seen within the right lower quadrant. There is no bowel obstruction. No abnormal bowel wall thickening or distension. REPRODUCTIVE: Marked prostatic hypertrophy. PERITONEUM AND RETROPERITONEUM: No ascites or free air. LYMPH NODES: No lymphadenopathy. BONES AND SOFT TISSUES: Osseous structures are age-appropriate. No acute bone abnormality. No lytic or blastic bone lesion. No acute soft tissue abnormality. IMPRESSION: 1. Intraluminal contrast within the distal esophagus and gastric cardia on venous phase images, possibly reflecting venous hemorrhage or gastroesophageal varices. Correlation with endoscopy would be helpful for further evaluation. 2. Thickening of the distal esophagus, nonspecific and possibly related to esophagitis. 3. More prostatic hypertrophy. Probable secondary changes of chronic bladder outlet obstruction. Electronically signed by: Dorethia Molt MD 10/11/2024 02:43 AM EST RP Workstation: [id number]   DG Chest Port 1 View Result Date: 10/11/2024 EXAM: 1 VIEW(S) XRAY OF THE CHEST 10/11/2024 12:21:00 AM COMPARISON: 08/18/2023 CLINICAL HISTORY: cp hematemesis FINDINGS: LUNGS AND PLEURA: Interval development of right lateral mid lung zone pleural thickening adjacent to the right fifth rib which may be posttraumatic in nature. No focal pulmonary opacity. No pleural  effusion. No pneumothorax. HEART AND MEDIASTINUM: No acute abnormality of the cardiac and mediastinal silhouettes. BONES AND SOFT TISSUES: Healed right posterior seventh rib fracture. Thoracic spondylosis. IMPRESSION: 1. Interval development of right lateral mid lung zone pleural thickening adjacent to the right fifth rib, possibly posttraumatic. Correlation with clinical examination is recommended. If indicated, this could be further assessed with short-term follow-up chest radiograph in 4-6 weeks or follow-up chest CT  examination. Electronically signed by: Dorethia Molt MD 10/11/2024 12:29 AM EST RP Workstation: [id number]    There are no new results to review at this time.  Previous records (including but not limited to H&P, progress notes, nursing notes, TOC management) were reviewed in assessment of this patient.  Labs: CBC: Recent Labs  Lab 10/12/24 0513 10/12/24 0823 10/12/24 1113 10/13/24 0411 10/13/24 0814 10/14/24 0225 10/14/24 1129 10/14/24 1828 10/15/24 0211 10/15/24 1026  WBC 14.4* 14.1*  --  17.9*  --  18.3*  --   --  16.4*  --   HGB 8.4* 8.1*   < > 9.7*   < > 8.3* 9.9* 10.1* 8.1* 8.7*  HCT 26.2* 25.6*   < > 31.8*   < > 28.0* 33.5* 35.8* 26.9* 29.8*  MCV 75.1* 75.3*  --  79.9*  --  81.4  --   --  80.5  --   PLT 504* 511*  --  441*  --  494*  --   --  469*  --    < > = values in this interval not displayed.   Basic Metabolic Panel: Recent Labs  Lab 10/11/24 0030 10/11/24 0757 10/11/24 2150 10/12/24 0513 10/13/24 0411 10/14/24 0225 10/15/24 0211  NA 136  --  136 137 141 138 139  K 4.3  --  3.6 3.6 3.7 3.6 4.0  CL 101  --   --  103 104 108 109  CO2 22  --   --  27 23 24 23   GLUCOSE 111*  --   --  146* 121* 140* 111*  BUN 11  --   --  7 7 8  <5*  CREATININE 0.83  --   --  0.79 0.81 0.64 0.53*  CALCIUM 8.6*  --   --  7.6* 7.8* 7.6* 7.6*  MG  --  2.4  --   --  2.4 2.0 1.9  PHOS  --  3.4  --   --  2.8  --   --    Liver Function Tests: Recent Labs  Lab  10/11/24 0030 10/12/24 0513 10/13/24 0411  AST 17 13* 14*  ALT 9 11 9   ALKPHOS 106 67 62  BILITOT 0.2 0.8 0.4  PROT 7.0 5.5* 5.6*  ALBUMIN  3.8 2.1* 2.0*   CBG: Recent Labs  Lab 10/14/24 1520 10/14/24 1947 10/14/24 2336 10/15/24 0356 10/15/24 0818  GLUCAP 201* 134* 104* 110* 116*    Scheduled Meds:  Chlorhexidine  Gluconate Cloth  6 each Topical Daily   feeding supplement  1 Container Oral TID WC   fluticasone  furoate-vilanterol  1 puff Inhalation Daily   folic acid   1 mg Intravenous Daily   heparin  injection (subcutaneous)  5,000 Units Subcutaneous Q8H   influenza vac split trivalent PF  0.5 mL Intramuscular Tomorrow-1000   multivitamin with minerals  1 tablet Oral Daily   mupirocin  ointment  1 Application Nasal BID   pantoprazole  (PROTONIX ) IV  40 mg Intravenous Q12H   Riociguat   1 tablet Oral TID   thiamine   100 mg Oral Daily   Or   thiamine   100 mg Intravenous Daily   Tiotropium Bromide   18 mcg Inhalation Daily   Continuous Infusions:  cefTRIAXone  (ROCEPHIN )  IV Stopped (10/14/24 2235)   PRN Meds:.ipratropium-albuterol , labetalol , morphine  injection, ondansetron  **OR** ondansetron  (ZOFRAN ) IV  Family Communication: None at bedside  Disposition: Status is: Inpatient Remains inpatient appropriate because: Anemia     Time spent: 51 minutes  Length of inpatient stay: 4 days  Author: Carliss LELON Canales, DO 10/15/2024 10:53 AM  For on call review [email or web address].

## 2024-10-15 NOTE — Hospital Course (Signed)
 58 year old man with a past medical history of CTEPH, hx of PE 08/2023 (on Eliquis ), COPD, anemia, EtOH use, tobacco use who presented to [facility] 12/3 as a transfer from [facility] for hematemesis.    Assessment and Plan:   Acute Mallory-Weiss tear - S/p EGD with extensive clots noted through the entire esophagus with longitudinal tear in the distal third.  S/p Hemospray.  Hematemesis appears resolved.  GI signing off 12/5.  Stopped IV octreotide .  PPI twice daily x 6 weeks then PPI once daily thereafter.  Advancing diet as tolerates.   Acute blood loss anemia - Hemoglobin 4.7 on presentation.  S/p 4 units PRBCs total.  Hemoglobin appears stable.  Will recheck CBC in AM.   Acute hypoxic respiratory failure - Noted hypoxia with ambulation, intermittently on 6 L nasal cannula.  Appears improved this morning down to 2 L.  Continue to wean O2 as tolerated.   Concern for mediastinitis/pneumonia - Continues on ceftriaxone .  Showing improvement in leukocytosis.   CTEPH/history of PE - Previously on anticoagulation.  Obviously on hold for now.  Will likely restart on discharge.   History of alcohol use - Currently on CIWA.  Vitamin supplementation.   Tobacco use - Encourage cessation.   Physical debilitation muscle weakness - Will order PT/OT.

## 2024-10-15 NOTE — Plan of Care (Signed)

## 2024-10-16 ENCOUNTER — Other Ambulatory Visit (HOSPITAL_COMMUNITY): Payer: Self-pay

## 2024-10-16 ENCOUNTER — Other Ambulatory Visit: Payer: Self-pay

## 2024-10-16 DIAGNOSIS — Z122 Encounter for screening for malignant neoplasm of respiratory organs: Secondary | ICD-10-CM

## 2024-10-16 DIAGNOSIS — Z87891 Personal history of nicotine dependence: Secondary | ICD-10-CM

## 2024-10-16 DIAGNOSIS — F17210 Nicotine dependence, cigarettes, uncomplicated: Secondary | ICD-10-CM

## 2024-10-16 DIAGNOSIS — F17200 Nicotine dependence, unspecified, uncomplicated: Secondary | ICD-10-CM

## 2024-10-16 LAB — COMPREHENSIVE METABOLIC PANEL WITH GFR
ALT: 10 U/L (ref 0–44)
AST: 15 U/L (ref 15–41)
Albumin: 1.9 g/dL — ABNORMAL LOW (ref 3.5–5.0)
Alkaline Phosphatase: 48 U/L (ref 38–126)
Anion gap: 8 (ref 5–15)
BUN: 5 mg/dL — ABNORMAL LOW (ref 6–20)
CO2: 26 mmol/L (ref 22–32)
Calcium: 7.7 mg/dL — ABNORMAL LOW (ref 8.9–10.3)
Chloride: 105 mmol/L (ref 98–111)
Creatinine, Ser: 0.61 mg/dL (ref 0.61–1.24)
GFR, Estimated: >60 mL/min (ref >60)
Glucose, Bld: 96 mg/dL (ref 70–99)
Potassium: 4 mmol/L (ref 3.5–5.1)
Sodium: 139 mmol/L (ref 135–145)
Total Bilirubin: 0.5 mg/dL (ref 0.0–1.2)
Total Protein: 5.5 g/dL — ABNORMAL LOW (ref 6.5–8.1)

## 2024-10-16 LAB — CBC
HCT: 26.4 % — ABNORMAL LOW (ref 39.0–52.0)
Hemoglobin: 7.8 g/dL — ABNORMAL LOW (ref 13.0–17.0)
MCH: 24 pg — ABNORMAL LOW (ref 26.0–34.0)
MCHC: 29.5 g/dL — ABNORMAL LOW (ref 30.0–36.0)
MCV: 81.2 fL (ref 80.0–100.0)
Platelets: 506 K/uL — ABNORMAL HIGH (ref 150–400)
RBC: 3.25 MIL/uL — ABNORMAL LOW (ref 4.22–5.81)
RDW: 24.7 % — ABNORMAL HIGH (ref 11.5–15.5)
WBC: 14.2 K/uL — ABNORMAL HIGH (ref 4.0–10.5)
nRBC: 0 % (ref 0.0–0.2)

## 2024-10-16 LAB — GLUCOSE, CAPILLARY: Glucose-Capillary: 106 mg/dL — ABNORMAL HIGH (ref 70–99)

## 2024-10-16 MED ORDER — SODIUM CHLORIDE 0.9 % IV BOLUS
500.0000 mL | Freq: Once | INTRAVENOUS | Status: AC
  Administered 2024-10-16: 500 mL via INTRAVENOUS

## 2024-10-16 MED ORDER — THIAMINE HCL 100 MG PO TABS
100.0000 mg | ORAL_TABLET | Freq: Every day | ORAL | 0 refills | Status: AC
  Filled 2024-10-16: qty 30, 30d supply, fill #0

## 2024-10-16 MED ORDER — FERROUS SULFATE 325 (65 FE) MG PO TABS
325.0000 mg | ORAL_TABLET | ORAL | 3 refills | Status: AC
  Filled 2024-10-16: qty 60, 120d supply, fill #0

## 2024-10-16 MED ORDER — FOLIC ACID 1 MG PO TABS
1.0000 mg | ORAL_TABLET | Freq: Every day | ORAL | 0 refills | Status: AC
  Filled 2024-10-16: qty 30, 30d supply, fill #0

## 2024-10-16 MED ORDER — ONDANSETRON HCL 4 MG PO TABS
4.0000 mg | ORAL_TABLET | Freq: Four times a day (QID) | ORAL | 0 refills | Status: AC | PRN
  Filled 2024-10-16: qty 20, 5d supply, fill #0

## 2024-10-16 MED ORDER — PANTOPRAZOLE SODIUM 40 MG PO TBEC
DELAYED_RELEASE_TABLET | ORAL | 0 refills | Status: AC
  Filled 2024-10-16: qty 114, 72d supply, fill #0

## 2024-10-16 NOTE — TOC Transition Note (Signed)
 Transition of Care [facility]) - Discharge Note   Patient Details  Name: [name] MRN: [id number] Date of Birth: February 04, 1966  Transition of Care [facility]) CM/SW Contact:  Roxie KANDICE Stain, RN Phone Number: 10/16/2024, 10:51 AM   Clinical Narrative:    Alan [name] is stable to discharge home. Follow up apt on AVS. Patient declines OP rehab.    Final next level of care: Home/Self Care Barriers to Discharge: Barriers Resolved   Patient Goals and CMS Choice Patient states their goals for this hospitalization and ongoing recovery are:: return home          Discharge Placement                     home  Discharge Plan and Services Additional resources added to the After Visit Summary for                                       Social Drivers of Health (SDOH) Interventions SDOH Screenings   Food Insecurity: No Food Insecurity (10/11/2024)  Housing: Low Risk  (10/11/2024)  Transportation Needs: No Transportation Needs (10/11/2024)  Utilities: Not At Risk (10/11/2024)  Depression (PHQ2-9): High Risk (06/02/2023)  Tobacco Use: High Risk (10/11/2024)     Readmission Risk Interventions     No data to display

## 2024-10-16 NOTE — Progress Notes (Signed)
 Patient  noted to be yellow MEWS again from earlier noted MEWS score at 1700, informed Dr. Franky and updated that patient is not on telemetry on unit. Ordered 500 mls bolus.    10/15/24 2352  Assess: MEWS Score  Temp 98.6 F (37 C)  BP 97/72  MAP (mmHg) 81  Pulse Rate (!) 102  Resp 17  SpO2 96 %  O2 Device Nasal Cannula  O2 Flow Rate (L/min) 4 L/min  Assess: MEWS Score  MEWS Temp 0  MEWS Systolic 1  MEWS Pulse 1  MEWS RR 0  MEWS LOC 0  MEWS Score 2  MEWS Score Color Yellow  Assess: if the MEWS score is Yellow or Red  Were vital signs accurate and taken at a resting state? Yes  Does the patient meet 2 or more of the SIRS criteria? No  MEWS guidelines implemented  No, previously yellow, continue vital signs every 4 hours  Notify: Charge Nurse/RN  Name of Charge Nurse/RN Notified Artist  Provider Notification  Provider Name/Title Dr. Franky  Date Provider Notified 10/15/24  Time Provider Notified 2358  Method of Notification Page  Notification Reason  (yellow MEWS)  Provider response See new orders  Date of Provider Response 10/15/24  Time of Provider Response 2359  Assess: SIRS CRITERIA  SIRS Temperature  0  SIRS Respirations  0  SIRS Pulse 1  SIRS WBC 0  SIRS Score Sum  1

## 2024-10-16 NOTE — Plan of Care (Signed)
   Problem: Clinical Measurements: Goal: Diagnostic test results will improve Outcome: Progressing   Problem: Clinical Measurements: Goal: Respiratory complications will improve Outcome: Progressing   Problem: Clinical Measurements: Goal: Cardiovascular complication will be avoided Outcome: Progressing

## 2024-10-16 NOTE — Discharge Summary (Signed)
 Physician Discharge Summary   Patient: Alan [name] MRN: [id number] DOB: [birth year]  Admit date:     10/10/2024  Discharge date: 10/16/24  Discharge Physician: Carliss LELON Canales   PCP: Cathlene Marry Lenis, FNP   Recommendations at discharge:    Pt to be discharged home.   If you experience worsening fever, chills, chest pain, shortness of breath, or other concerning symptoms, please call your PCP or go to the emergency department immediately.  Discharge Diagnoses: Principal Problem:   Upper GI bleed Active Problems:   CTEPH (chronic thromboembolic pulmonary hypertension) (HCC)   Iron deficiency anemia due to chronic blood loss   Protein-calorie malnutrition, severe   Mallory-Weiss tear   Acute blood loss anemia  Resolved Problems:   * No resolved hospital problems. *   Hospital Course:  58 year old man with a past medical history of CTEPH, hx of PE 08/2023 (on Eliquis ), COPD, anemia, EtOH use, tobacco use who presented to [facility] 12/3 as a transfer from [facility] for hematemesis.    Assessment and Plan:   Acute Mallory-Weiss tear - S/p EGD with extensive clots noted through the entire esophagus with longitudinal tear in the distal third.  S/p Hemospray.  Hematemesis appears resolved.  GI signing off 12/5.  Stopped IV octreotide .  PPI twice daily x 6 weeks then PPI once daily thereafter.  Tolerated advancement of diet.  Will give prescription for Protonix  to take twice a day for the next 6 weeks.  Once daily thereafter.  Patient will need to follow-up with GI in [place], Dr. Shaaron (referral provided).   Acute blood loss anemia - Hemoglobin 4.7 on presentation.  S/p 4 units PRBCs total.  Hemoglobin appears stable.  Will discharge with p.o. iron supplementation to take every other day.   Acute hypoxic respiratory failure - Noted hypoxia with ambulation, intermittently on 6 L nasal cannula.  Patient was able to be weaned to room air.  Walked/ambulatory with O2 sats greater than  90%.  Concern for mediastinitis/pneumonia - Completed 5 days of empiric ceftriaxone .     CTEPH/history of PE - Previously on anticoagulation.  Obviously on hold during hospitalization.  Can resume anticoagulation tomorrow 12/9.  History of alcohol use - Provided vitamin supplementation.  Continue to recommend abstaining.   Tobacco use - Encourage cessation.  Lung cancer screening referral provided.   Consultants: Gastroenterology, PCCM Procedures performed: Esophagogastroduodenoscopy Disposition: Home Diet recommendation:  Discharge Diet Orders (From admission, onward)     Start     Ordered   10/16/24 0000  Diet - low sodium heart healthy        10/16/24 1039           Cardiac diet  DISCHARGE MEDICATION: Allergies as of 10/16/2024   No Known Allergies      Medication List     TAKE these medications    Adempas  2.5 MG Tabs Generic drug: Riociguat  Take 1 tablet by mouth 3 (three) times daily.   albuterol  108 (90 Base) MCG/ACT inhaler Commonly known as: ProAir  HFA 2 puffs every 4 hours as needed only  if your can't catch your breath   apixaban  5 MG Tabs tablet Commonly known as: ELIQUIS  Take 1 tablet (5 mg total) by mouth 2 (two) times daily.   aspirin -sod bicarb-citric acid 325 MG Tbef tablet Commonly known as: ALKA-SELTZER Take 650 mg by mouth every 6 (six) hours as needed (indigestion).   BAYER BACK & BODY PO Take 1 tablet by mouth daily as needed (pain).   Dulera   100-5 MCG/ACT Aero Generic drug: mometasone -formoterol  Inhale 2 puffs into the lungs 2 (two) times daily.   famotidine-calcium carbonate-magnesium  hydroxide 10-800-165 MG chewable tablet Commonly known as: PEPCID COMPLETE Chew 1 tablet by mouth daily as needed (acid reflux).   ferrous sulfate  325 (65 FE) MG EC tablet Take 1 tablet (325 mg total) by mouth every other day.   folic acid  1 MG tablet Commonly known as: FOLVITE  Take 1 tablet (1 mg total) by mouth daily. Start taking on:  October 17, 2024   ondansetron  4 MG tablet Commonly known as: ZOFRAN  Take 1 tablet (4 mg total) by mouth every 6 (six) hours as needed for nausea.   pantoprazole  40 MG tablet Commonly known as: Protonix  Take 1 tablet (40 mg total) by mouth 2 (two) times daily.   ROLAIDS PO Take 1 tablet by mouth daily as needed (acid reflux).   Spiriva  Respimat 2.5 MCG/ACT Aers Generic drug: Tiotropium Bromide  Inhale 2 puffs into the lungs daily.   thiamine  100 MG tablet Commonly known as: Vitamin B-1 Take 1 tablet (100 mg total) by mouth daily. Start taking on: October 17, 2024   tiotropium 18 MCG inhalation capsule Commonly known as: SPIRIVA  Place 1 capsule (18 mcg total) into inhaler and inhale daily.   torsemide  20 MG tablet Commonly known as: DEMADEX  Take 10 mg by mouth every other day.        Follow-up Information     [facility] ( outpatient treatment plan). Call.   Contact information: [address] [place], [place] Walk in - [phone]- Fri) 5:30am-11AM Schedule appointment [phone]                Discharge Exam: Fredricka Weights   10/10/24 2347 10/11/24 0500  Weight: 56.7 kg 51.1 kg    GENERAL:  Alert, pleasant, no acute distress  HEENT:  EOMI CARDIOVASCULAR:  RRR, no murmurs appreciated RESPIRATORY:  Clear to auscultation, no wheezing, rales, or rhonchi GASTROINTESTINAL:  Soft, nontender, nondistended EXTREMITIES:  No LE edema bilaterally NEURO:  No new focal deficits appreciated SKIN:  No rashes noted PSYCH:  Appropriate mood and affect     Condition at discharge: improving  The results of significant diagnostics from this hospitalization (including imaging, microbiology, ancillary and laboratory) are listed below for reference.   Imaging Studies: DG CHEST PORT 1 VIEW Result Date: 10/13/2024 EXAM: 1 VIEW(S) XRAY OF THE CHEST 10/13/2024 05:08:00 AM COMPARISON: 10/12/2024 CLINICAL HISTORY: 01364 Infection 98635; 4361 Mediastinitis 5638 FINDINGS:  LINES, TUBES AND DEVICES: Endotracheal tube in place with tip 6.4 cm above the carina. LUNGS AND PLEURA: Bibasilar patchy airspace opacities, right greater than left. Bilateral trace pleural effusions. No pneumothorax. HEART AND MEDIASTINUM: Dilated esophagus is again noted. No acute abnormality of the cardiac and mediastinal silhouettes. BONES AND SOFT TISSUES: Old healed right posterior rib fracture. IMPRESSION: 1. Bibasilar patchy airspace opacities, right greater than left. 2. Bilateral trace pleural effusions. 3. Dilated esophagus. Electronically signed by: Waddell Calk MD 10/13/2024 06:50 AM EST RP Workstation: [id number]   Portable Chest xray Result Date: 10/12/2024 CLINICAL DATA:  Endotracheal tube placement. EXAM: PORTABLE CHEST 1 VIEW COMPARISON:  10/11/2024, chest CT 10/11/2024 FINDINGS: Interval placement of endotracheal tube with tip 6.1 cm above the carina. Lungs are adequately inflated with minimal hazy bibasilar opacification compatible with small layering effusions and atelectasis as seen on recent CT. Slight worsening hazy density over the right base possibly worsening atelectasis or developing infection. Cardiomediastinal silhouette and remainder of the exam is unchanged. IMPRESSION: 1. Slight  worsening hazy density over the right base which may be due to worsening atelectasis or developing infection. 2. Small layering effusions and bibasilar atelectasis as seen on recent CT. 3. Endotracheal tube with tip 6.1 cm above the carina. Electronically Signed   By: Toribio Agreste M.D.   On: 10/12/2024 08:29   CT CHEST WO CONTRAST Result Date: 10/11/2024 EXAM: CT CHEST WITHOUT CONTRAST 10/11/2024 04:12:40 PM TECHNIQUE: CT of the chest was performed without the administration of intravenous contrast. Multiplanar reformatted images are provided for review. Automated exposure control, iterative reconstruction, and/or weight based adjustment of the mA/kV was utilized to reduce the radiation dose to as  low as reasonably achievable. COMPARISON: CT angiogram of the abdomen 10/11/2024 and CTA chest 07/25/2023. CLINICAL HISTORY: Active bleeding into the distal esophagus on CT angiogram of 10/11/2024; EGD performed today showed extensive longitudinal tear in the mid to distal esophagus with extensive clot in the esophagus; hemospray was utilized. FINDINGS: MEDIASTINUM: Heart and pericardium are unremarkable. Thoracic aortic, coronary artery, and branch vessel atheromatous vascular calcifications. 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 The endotracheal tube appears satisfactorily positioned. Diffuse dilation of the esophagus with heterogeneous frothy intraluminal material. Dilated thoracic esophagus. High density material along the wall of the esophagus especially anteriorly and eccentric to the left extending all the way to the proximal stomach, compatible with hemospray administered at the time of EGD. Very indistinct margins between the esophageal wall and surrounding mediastinum with diffuse high density edema in the mediastinum. None of the hemospray is directly observed to extend extraluminal. I cannot exclude esophageal wall thickening as most of the frothy material is centrally within the lumen. No definite extraluminal gas around the esophagus. Administering the patient oral contrast is not readily feasible given the intubated status. LYMPH NODES: No mediastinal, hilar or axillary lymphadenopathy. LUNGS AND PLEURA: Small bilateral pleural effusions. Emphysema. Airway thickening suggest bronchitis or reactive airways disease. Substantial airway plugging in both lower lobes, with essentially occluded lower lobe bronchi bilaterally. 4 x 3 mm right upper lobe pulmonary nodule on image 65 series 5 stable from 07/15/2023 and considered benign. No focal consolidation or pulmonary edema. No pneumothorax. SOFT TISSUES/BONES: Small amount of gas in the right neck, likely within venous structures and  probably related to IV, image 15 series 3. No acute abnormality of the bones or soft tissues. UPPER ABDOMEN: Limited images of the upper abdomen demonstrates vicarious contrast excretion in the gallbladder along with some residual contrast in the renal collecting systems. No acute abnormality. IMPRESSION: 1. The abnormal esophagus appears dilated with substantial frothy intraluminal contents, high-density peripheral material compatible with hemospray administered at egd , and possible wall thickening. No surrounding pneumomediastinum to indicate definite full thickness perforation. 2. Emphysema and airway thickening suspicious for bronchitis or reactive airways disease, with substantial airway plugging occluding both lower lobe bronchi bilaterally. 3. Small bilateral pleural effusions. . 4. Multiple additional chronic and incidental findings, including atherosclerotic vascular calcifications, stable right upper tracheal diverticulum, endotracheal tube in satisfactory position, small amount of presumed venous gas in the right lower neck likely intravascular from IV access, and ancillary contrast-related findings, as detailed in the report body. Electronically signed by: Ryan Salvage MD 10/11/2024 05:15 PM EST RP Workstation: [id number]   CT ANGIO GI BLEED Result Date: 10/11/2024 EXAM: CTA ABDOMEN AND PELVIS WITH CONTRAST 10/11/2024 02:12:44 AM TECHNIQUE: CTA images of the abdomen and pelvis with intravenous contrast. 100 mL of iohexol  (OMNIPAQUE ) 350 MG/ML injection was administered. Three-dimensional MIP/volume rendered formations were performed. Automated  exposure control, iterative reconstruction, and/or weight based adjustment of the mA/kV was utilized to reduce the radiation dose to as low as reasonably achievable. COMPARISON: None available. CLINICAL HISTORY: Duodenal ulcer FINDINGS: VASCULATURE: GI BLEED: There is intraluminal contrast within the distal esophagus and gastric cardia seen only on venous  phase images which may reflect venous hemorrhage, as could be seen with variceal hemorrhage, or gastroesophageal varices within this region. No other foci of gastrointestinal hemorrhage are identified. AORTA: Mild atherosclerotic calcification within the abdominal aorta. No aneurysm or dissection. CELIAC TRUNK: No acute finding. No occlusion or significant stenosis. SUPERIOR MESENTERIC ARTERY: No acute finding. No occlusion or significant stenosis. INFERIOR MESENTERIC ARTERY: No acute finding. No occlusion or significant stenosis. RENAL ARTERIES: No acute finding. No occlusion or significant stenosis. ILIAC ARTERIES: No acute finding. No occlusion or significant stenosis. ABDOMEN/PELVIS: LOWER CHEST: Visualized portion of the lower chest demonstrates no acute abnormality. LIVER: The liver is unremarkable. GALLBLADDER AND BILE DUCTS: Gallbladder is unremarkable. No biliary ductal dilatation. SPLEEN: The spleen is unremarkable. PANCREAS: The pancreas is unremarkable. ADRENAL GLANDS: Bilateral adrenal glands demonstrate no acute abnormality. KIDNEYS, URETERS AND BLADDER: There is circumferential thickening of the urinary bladder wall, which may relate to changes of chronic bladder outlet obstruction. Marked prostatic hypertrophy. No stones in the kidneys or ureters. No hydronephrosis. No perinephric or periureteral stranding. GI AND BOWEL: There is thickening of the distal esophagus, which is nonspecific but may relate to underlying esophagitis, such as reflux esophagitis. This could be better assessed with endoscopy if indicated. There is intraluminal contrast within the distal esophagus and gastric cardia seen only on venous phase images which may reflect venous hemorrhage, as could be seen with variceal hemorrhage, or gastroesophageal varices within this region. Moderate stool burden. The stomach, small bowel, and large bowel are otherwise unremarkable. The appendix is not clearly identified on this examination;  however, no secondary signs of appendicitis are seen within the right lower quadrant. There is no bowel obstruction. No abnormal bowel wall thickening or distension. REPRODUCTIVE: Marked prostatic hypertrophy. PERITONEUM AND RETROPERITONEUM: No ascites or free air. LYMPH NODES: No lymphadenopathy. BONES AND SOFT TISSUES: Osseous structures are age-appropriate. No acute bone abnormality. No lytic or blastic bone lesion. No acute soft tissue abnormality. IMPRESSION: 1. Intraluminal contrast within the distal esophagus and gastric cardia on venous phase images, possibly reflecting venous hemorrhage or gastroesophageal varices. Correlation with endoscopy would be helpful for further evaluation. 2. Thickening of the distal esophagus, nonspecific and possibly related to esophagitis. 3. More prostatic hypertrophy. Probable secondary changes of chronic bladder outlet obstruction. Electronically signed by: Dorethia Molt MD 10/11/2024 02:43 AM EST RP Workstation: [id number]   DG Chest Port 1 View Result Date: 10/11/2024 EXAM: 1 VIEW(S) XRAY OF THE CHEST 10/11/2024 12:21:00 AM COMPARISON: 08/18/2023 CLINICAL HISTORY: cp hematemesis FINDINGS: LUNGS AND PLEURA: Interval development of right lateral mid lung zone pleural thickening adjacent to the right fifth rib which may be posttraumatic in nature. No focal pulmonary opacity. No pleural effusion. No pneumothorax. HEART AND MEDIASTINUM: No acute abnormality of the cardiac and mediastinal silhouettes. BONES AND SOFT TISSUES: Healed right posterior seventh rib fracture. Thoracic spondylosis. IMPRESSION: 1. Interval development of right lateral mid lung zone pleural thickening adjacent to the right fifth rib, possibly posttraumatic. Correlation with clinical examination is recommended. If indicated, this could be further assessed with short-term follow-up chest radiograph in 4-6 weeks or follow-up chest CT examination. Electronically signed by: Dorethia Molt MD 10/11/2024 12:29  AM EST RP Workstation: [id number]  Microbiology: Results for orders placed or performed during the hospital encounter of 10/10/24  MRSA Next Gen by PCR, Nasal     Status: Abnormal   Collection Time: 10/11/24  5:00 AM   Specimen: Nasal Mucosa; Nasal Swab  Result Value Ref Range Status   MRSA by PCR Next Gen DETECTED (A) NOT DETECTED Final    Comment: RESULT CALLED TO, READ BACK BY AND VERIFIED WITH: PATIENT DISCHARGED OR EXPIRED (NOTE) The GeneXpert MRSA Assay (FDA approved for NASAL specimens only), is one component of a comprehensive MRSA colonization surveillance program. It is not intended to diagnose MRSA infection nor to guide or monitor treatment for MRSA infections. Test performance is not FDA approved in patients less than 48 years old. Performed at [facility], [address], [place], [place] [address]   MRSA Next Gen by PCR, Nasal     Status: Abnormal   Collection Time: 10/11/24  8:54 PM   Specimen: Nasal Mucosa; Nasal Swab  Result Value Ref Range Status   MRSA by PCR Next Gen DETECTED (A) NOT DETECTED Final    Comment: RESULT CALLED TO, READ BACK BY AND VERIFIED WITH: RN DEIRDRE H [id number] FCP (NOTE) The GeneXpert MRSA Assay (FDA approved for NASAL specimens only), is one component of a comprehensive MRSA colonization surveillance program. It is not intended to diagnose MRSA infection nor to guide or monitor treatment for MRSA infections. Test performance is not FDA approved in patients less than 54 years old. Performed at [facility], [address], [place], [place] [address]   Expectorated Sputum Assessment w Gram Stain, Rflx to Resp Cult     Status: None   Collection Time: 10/12/24 10:22 AM   Specimen: Sputum  Result Value Ref Range Status   Specimen Description SPUTUM  Final   Special Requests NONE  Final   Sputum evaluation   Final    THIS SPECIMEN IS ACCEPTABLE FOR SPUTUM CULTURE Performed at [facility], [address],  [place], [place] [address]    Report Status 10/12/2024 FINAL  Final  Culture, Respiratory w Gram Stain     Status: None   Collection Time: 10/12/24 10:22 AM   Specimen: SPU  Result Value Ref Range Status   Specimen Description SPUTUM  Final   Special Requests NONE Reflexed from [id number]  Final   Gram Stain   Final    MODERATE WBC PRESENT, PREDOMINANTLY PMN RARE GRAM POSITIVE COCCI IN PAIRS RARE GRAM POSITIVE RODS Performed at [facility], [address] [place], [place] [address]    Culture RARE CANDIDA ALBICANS  Final   Report Status 10/15/2024 FINAL  Final    Labs: CBC: Recent Labs  Lab 10/12/24 0823 10/12/24 1113 10/13/24 0411 10/13/24 0814 10/14/24 0225 10/14/24 1129 10/14/24 1828 10/15/24 0211 10/15/24 1026 10/16/24 0722  WBC 14.1*  --  17.9*  --  18.3*  --   --  16.4*  --  14.2*  HGB 8.1*   < > 9.7*   < > 8.3* 9.9* 10.1* 8.1* 8.7* 7.8*  HCT 25.6*   < > 31.8*   < > 28.0* 33.5* 35.8* 26.9* 29.8* 26.4*  MCV 75.3*  --  79.9*  --  81.4  --   --  80.5  --  81.2  PLT 511*  --  441*  --  494*  --   --  469*  --  506*   < > = values in this interval not displayed.  Basic Metabolic Panel: Recent Labs  Lab 10/11/24 0757 10/11/24 2150 10/12/24 0513 10/13/24 0411 10/14/24 0225 10/15/24 0211 10/16/24 0722  NA  --    < > 137 141 138 139 139  K  --    < > 3.6 3.7 3.6 4.0 4.0  CL  --   --  103 104 108 109 105  CO2  --   --  27 23 24 23 26   GLUCOSE  --   --  146* 121* 140* 111* 96  BUN  --   --  7 7 8  <5* 5*  CREATININE  --   --  0.79 0.81 0.64 0.53* 0.61  CALCIUM  --   --  7.6* 7.8* 7.6* 7.6* 7.7*  MG 2.4  --   --  2.4 2.0 1.9  --   PHOS 3.4  --   --  2.8  --   --   --    < > = values in this interval not displayed.   Liver Function Tests: Recent Labs  Lab 10/11/24 0030 10/12/24 0513 10/13/24 0411 10/16/24 0722  AST 17 13* 14* 15  ALT 9 11 9 10   ALKPHOS 106 67 62 48  BILITOT 0.2 0.8 0.4 0.5  PROT 7.0 5.5* 5.6* 5.5*  ALBUMIN  3.8 2.1* 2.0* 1.9*    CBG: Recent Labs  Lab 10/14/24 2336 10/15/24 0356 10/15/24 0818 10/15/24 1138 10/15/24 1538  GLUCAP 104* 110* 116* 127* 106*    Discharge time spent: 38 minutes.  Length of inpatient stay: 5 days  Signed: Carliss LELON Canales, DO [facility] 10/16/2024

## 2024-10-17 NOTE — Progress Notes (Unsigned)
 Subjective:  Patient ID: Alan [name], male    DOB: [birth year], 58 y.o.   MRN: [id number]  Patient Care Team: Cathlene Marry Lenis, FNP as PCP - General (Family Medicine) Mallipeddi, Diannah SQUIBB, MD as PCP - Cardiology (Cardiology) Darlean Ozell NOVAK, MD as Consulting Physician (Pulmonary Disease)   Chief Complaint:  No chief complaint on file.   HPI: Alan [name] is a 58 y.o. male presenting on 10/18/2024 for No chief complaint on file.   Discussed the use of AI scribe software for clinical note transcription with the patient, who gave verbal consent to proceed.  History of Present Illness    D/c 10/10/2024 Acute bloss Hemoglobin 4.7 on presentation. S/p 4 units PRBCs tota  [facility] ( outpatient treatment plan). Call.  Contact information: [address] [place], [place] Walk in - [phone]) 5:30am-11AM Schedule appointment [phone]  Chest X-ray (10/13/2024): Portable 1-view CXR shows bibasilar patchy airspace opacities (right > left), consistent with atelectasis vs developing infection. Trace bilateral pleural effusions. Endotracheal tube appropriately positioned 6.4 cm above the carina. Dilated esophagus again noted. No pneumothorax. Old healed right posterior rib fracture.  Chest X-ray (10/12/2024): Interval placement of endotracheal tube (6.1 cm above carina). Small layering pleural effusions and bibasilar atelectasis, similar to recent CT. Slight worsening hazy density at right lung base--atelectasis vs early infection.  CT Chest Without Contrast (10/11/2024): Dilated esophagus with substantial intraluminal frothy contents and high-density material consistent with hemospray post-EGD. No pneumomediastinum to suggest perforation. Small bilateral pleural effusions. Emphysema and airway thickening with significant airway plugging causing near-complete occlusion of both lower-lobe bronchi. Endotracheal tube in good position. No acute upper abdominal  findings.  CTA Abdomen/Pelvis (10/11/2024): Intraluminal contrast in distal esophagus/gastric cardia on venous phase possibly representing venous hemorrhage/varices. Distal esophageal thickening possibly due to esophagitis. Marked prostatic hypertrophy with bladder wall thickening suggesting chronic outlet obstruction. No bowel obstruction or other acute abdominal pathology.  Chest X-ray (10/11/2024): Right lateral mid-lung pleural thickening near 5th rib--may be post-traumatic. No focal consolidation, effusion, or pneumothorax. Old healed rib fracture.   Relevant past medical, surgical, family, and social history reviewed and updated as indicated.  Allergies and medications reviewed and updated. Data reviewed: Chart in Epic.   Past Medical History:  Diagnosis Date   Anemia    COPD (chronic obstructive pulmonary disease) (HCC)    CTEPH (chronic thromboembolic pulmonary hypertension) (HCC)    History of ETOH abuse    Pulmonary embolism (HCC)    On Eliquis    Pulmonary hypertension (HCC)    Right knee pain    Right shoulder pain     Past Surgical History:  Procedure Laterality Date   ESOPHAGOGASTRODUODENOSCOPY N/A 10/11/2024   Procedure: EGD (ESOPHAGOGASTRODUODENOSCOPY);  Surgeon: Shaaron Lamar HERO, MD;  [facility];  Service: Endoscopy;  Laterality: N/A;   PULMONARY ANGIOGRAPHY N/A 04/06/2024   Procedure: PULMONARY ANGIOGRAPHY;  Surgeon: Zenaida Morene PARAS, MD;  Location: [facility] INVASIVE CV LAB;  Service: Cardiovascular;  Laterality: N/A;   RIGHT HEART CATH N/A 10/13/2023   Procedure: RIGHT HEART CATH;  Surgeon: Anner Alm ORN, MD;  Location: [facility] INVASIVE CV LAB;  Service: Cardiovascular;  Laterality: N/A;   RIGHT HEART CATH N/A 04/06/2024   Procedure: RIGHT HEART CATH;  Surgeon: Zenaida Morene PARAS, MD;  Location: [facility] INVASIVE CV LAB;  Service: Cardiovascular;  Laterality: N/A;   TONSILLECTOMY  58 yo    Social History   Socioeconomic History   Marital status: Divorced  Spouse name: Not on file   Number of children: Not on file   Years of education: Not on file   Highest education level: Not on file  Occupational History   Not on file  Tobacco Use   Smoking status: Every Day    Current packs/day: 1.50    Average packs/day: 1.5 packs/day for 43.3 years (65.0 ttl pk-yrs)    Types: Cigarettes    Start date: 06/12/1981   Smokeless tobacco: Never   Tobacco comments:    Smokes 1ppd ... Verified by [facility] 05/12/2023  Vaping Use   Vaping status: Never Used  Substance and Sexual Activity   Alcohol use: Yes    Comment: 12 pack/week   Drug use: Yes    Types: Marijuana    Comment: MJ daily .  no cocaine since 2021   Sexual activity: Yes  Other Topics Concern   Not on file  Social History Narrative   Lives alone.  Disabled.   Social Drivers of Corporate Investment Banker Strain: Not on file  Food Insecurity: No Food Insecurity (10/11/2024)   Hunger Vital Sign    Worried About Running Out of Food in the Last Year: Never true    Ran Out of Food in the Last Year: Never true  Transportation Needs: No Transportation Needs (10/11/2024)   PRAPARE - Administrator, Civil Service (Medical): No    Lack of Transportation (Non-Medical): No  Physical Activity: Not on file  Stress: Not on file  Social Connections: Not on file  Intimate Partner Violence: Not At Risk (10/11/2024)   Humiliation, Afraid, Rape, and Kick questionnaire    Fear of Current or Ex-Partner: No    Emotionally Abused: No    Physically Abused: No    Sexually Abused: No    Outpatient Encounter Medications as of 10/18/2024  Medication Sig   ADEMPAS  2.5 MG TABS Take 1 tablet by mouth 3 (three) times daily.   albuterol  (PROAIR  HFA) 108 (90 Base) MCG/ACT inhaler 2 puffs every 4 hours as needed only  if your can't catch your breath   apixaban  (ELIQUIS ) 5 MG TABS tablet Take 1 tablet (5 mg total) by mouth 2 (two) times daily.   Aspirin -Caffeine (BAYER BACK & BODY PO) Take 1 tablet by mouth  daily as needed (pain).   aspirin -sod bicarb-citric acid (ALKA-SELTZER) 325 MG TBEF tablet Take 650 mg by mouth every 6 (six) hours as needed (indigestion).   Dihydroxyaluminum Sod Carb (ROLAIDS PO) Take 1 tablet by mouth daily as needed (acid reflux).   famotidine-calcium carbonate-magnesium  hydroxide (PEPCID COMPLETE) 10-800-165 MG chewable tablet Chew 1 tablet by mouth daily as needed (acid reflux).   ferrous sulfate  325 (65 FE) MG tablet Take 1 tablet (325 mg total) by mouth every other day.   folic acid  (FOLVITE ) 1 MG tablet Take 1 tablet (1 mg total) by mouth daily.   mometasone -formoterol  (DULERA ) 100-5 MCG/ACT AERO Inhale 2 puffs into the lungs 2 (two) times daily.   ondansetron  (ZOFRAN ) 4 MG tablet Take 1 tablet (4 mg total) by mouth every 6 (six) hours as needed for nausea.   pantoprazole  (PROTONIX ) 40 MG tablet Take 1 tablet (40 mg total) by mouth 2 (two) times daily for 42 days, THEN 1 tablet (40 mg total) daily.   thiamine  (VITAMIN B1) 100 MG tablet Take 1 tablet (100 mg total) by mouth daily.   tiotropium (SPIRIVA ) 18 MCG inhalation capsule Place 1 capsule (18 mcg total) into inhaler and inhale daily.  Tiotropium Bromide  (SPIRIVA  RESPIMAT) 2.5 MCG/ACT AERS Inhale 2 puffs into the lungs daily.   torsemide  (DEMADEX ) 20 MG tablet Take 10 mg by mouth every other day.   No facility-administered encounter medications on file as of 10/18/2024.    No Known Allergies  Pertinent ROS per HPI, otherwise unremarkable      Objective:  There were no vitals taken for this visit.   Wt Readings from Last 3 Encounters:  10/11/24 112 lb 10.5 oz (51.1 kg)  06/15/24 117 lb (53.1 kg)  05/09/24 115 lb 12.8 oz (52.5 kg)    Physical Exam Physical Exam      Results for orders placed or performed during the hospital encounter of 10/10/24  CBC   Collection Time: 10/11/24 12:30 AM  Result Value Ref Range   WBC 18.0 (H) 4.0 - 10.5 K/uL   RBC 2.36 (L) 4.22 - 5.81 MIL/uL   Hemoglobin 4.7  (LL) 13.0 - 17.0 g/dL   HCT 82.5 (L) 60.9 - 47.9 %   MCV 73.7 (L) 80.0 - 100.0 fL   MCH 19.9 (L) 26.0 - 34.0 pg   MCHC 27.0 (L) 30.0 - 36.0 g/dL   RDW 74.3 (H) 88.4 - 84.4 %   Platelets 588 (H) 150 - 400 K/uL   nRBC 0.0 0.0 - 0.2 %  Comprehensive metabolic panel with GFR   Collection Time: 10/11/24 12:30 AM  Result Value Ref Range   Sodium 136 135 - 145 mmol/L   Potassium 4.3 3.5 - 5.1 mmol/L   Chloride 101 98 - 111 mmol/L   CO2 22 22 - 32 mmol/L   Glucose, Bld 111 (H) 70 - 99 mg/dL   BUN 11 6 - 20 mg/dL   Creatinine, Ser 9.16 0.61 - 1.24 mg/dL   Calcium 8.6 (L) 8.9 - 10.3 mg/dL   Total Protein 7.0 6.5 - 8.1 g/dL   Albumin  3.8 3.5 - 5.0 g/dL   AST 17 15 - 41 U/L   ALT 9 0 - 44 U/L   Alkaline Phosphatase 106 38 - 126 U/L   Total Bilirubin 0.2 0.0 - 1.2 mg/dL   GFR, Estimated >39 >39 mL/min   Anion gap 13 5 - 15  Lipase, blood   Collection Time: 10/11/24 12:30 AM  Result Value Ref Range   Lipase 17 11 - 51 U/L  Protime-INR   Collection Time: 10/11/24 12:30 AM  Result Value Ref Range   Prothrombin  Time 15.5 (H) 11.4 - 15.2 seconds   INR 1.2 0.8 - 1.2  Type and screen [facility]   Collection Time: 10/11/24 12:30 AM  Result Value Ref Range   ABO/RH(D) A POS    Antibody Screen NEG    Sample Expiration 10/14/2024,2359    Unit Number [id number]    Blood Component Type RED CELLS,LR    Unit division 00    Status of Unit ISSUED,FINAL    Transfusion Status OK TO TRANSFUSE    Crossmatch Result Compatible    Unit Number [id number]    Blood Component Type RED CELLS,LR    Unit division 00    Status of Unit ISSUED,FINAL    Transfusion Status OK TO TRANSFUSE    Crossmatch Result Compatible    Unit Number [id number]    Blood Component Type RED CELLS,LR    Unit division 00    Status of Unit ISSUED,FINAL    Transfusion Status OK TO TRANSFUSE    Crossmatch Result Compatible    Unit Number [id number]  Blood Component Type RED CELLS,LR    Unit division 00     Status of Unit ISSUED,FINAL    Transfusion Status OK TO TRANSFUSE    Crossmatch Result Compatible    Unit Number [id number]    Blood Component Type RED CELLS,LR    Unit division 00    Status of Unit REL FROM [facility]    Transfusion Status OK TO TRANSFUSE    Crossmatch Result Compatible    Unit Number [id number]    Blood Component Type RED CELLS,LR    Unit division 00    Status of Unit REL FROM [facility]    Transfusion Status OK TO TRANSFUSE    Crossmatch Result      Compatible Performed at [facility], [address] [place], [place] [address]   Prepare RBC (crossmatch)   Collection Time: 10/11/24 12:30 AM  Result Value Ref Range   Order Confirmation      ORDER PROCESSED BY BLOOD BANK Performed at [facility], [address], [place], [place] [address]   [facility]   Collection Time: 10/11/24 12:30 AM  Result Value Ref Range   ISSUE DATE / TIME 797487969790    Blood Product Unit Number [id number]    PRODUCT CODE [id number]    Unit Type and Rh 6200    Blood Product Expiration Date 797487807640    ISSUE DATE / TIME 797487969593    Blood Product Unit Number [id number]    PRODUCT CODE [id number]    Unit Type and Rh 6200    Blood Product Expiration Date 797487807640    ISSUE DATE / TIME 797487969871    Blood Product Unit Number [id number]    PRODUCT CODE [id number]    Unit Type and Rh 6200    Blood Product Expiration Date 797487807640    ISSUE DATE / TIME 797487969151    Blood Product Unit Number [id number]    PRODUCT CODE [id number]    Unit Type and Rh 0600    Blood Product Expiration Date 797487947640    Blood Product Unit Number [id number]    PRODUCT CODE [id number]    Unit Type and Rh 6200    Blood Product Expiration Date 202512222359    Blood Product Unit Number [id number]    PRODUCT CODE [id number]    Unit Type and Rh 6200    Blood Product Expiration Date 202512222359   Troponin T, High Sensitivity   Collection Time: 10/11/24 12:30 AM  Result Value  Ref Range   Troponin T High Sensitivity 33 (H) 0 - 19 ng/L  ABO/Rh   Collection Time: 10/11/24  1:10 AM  Result Value Ref Range   ABO/RH(D)      A POS Performed at [facility], [address] [place], [place] [address]   MRSA Next Gen by PCR, Nasal   Collection Time: 10/11/24  5:00 AM   Specimen: Nasal Mucosa; Nasal Swab  Result Value Ref Range   MRSA by PCR Next Gen DETECTED (A) NOT DETECTED  Iron and TIBC   Collection Time: 10/11/24  7:57 AM  Result Value Ref Range   Iron 227 (H) 45 - 182 ug/dL   TIBC 565 749 - 549 ug/dL   Saturation Ratios 52 (H) 17.9 - 39.5 %   UIBC 207 ug/dL  Ferritin   Collection Time: 10/11/24  7:57 AM  Result Value Ref Range   Ferritin 25 24 - 336 ng/mL  HIV Antibody (routine testing w rflx)   Collection Time: 10/11/24  7:57 AM  Result Value  Ref Range   HIV Screen 4th Generation wRfx Non Reactive Non Reactive  Magnesium    Collection Time: 10/11/24  7:57 AM  Result Value Ref Range   Magnesium  2.4 1.7 - 2.4 mg/dL  Phosphorus   Collection Time: 10/11/24  7:57 AM  Result Value Ref Range   Phosphorus 3.4 2.5 - 4.6 mg/dL  Hemoglobin and hematocrit, blood   Collection Time: 10/11/24  7:57 AM  Result Value Ref Range   Hemoglobin 8.0 (L) 13.0 - 17.0 g/dL   HCT 73.7 (L) 60.9 - 47.9 %  Troponin T, High Sensitivity   Collection Time: 10/11/24  7:57 AM  Result Value Ref Range   Troponin T High Sensitivity 20 (H) 0 - 19 ng/L  Prepare RBC (crossmatch)   Collection Time: 10/11/24  8:05 AM  Result Value Ref Range   Order Confirmation      ORDER PROCESSED BY BLOOD BANK Performed at [facility], [address], [place], [place] [address]   Glucose, capillary   Collection Time: 10/11/24  3:36 PM  Result Value Ref Range   Glucose-Capillary 137 (H) 70 - 99 mg/dL  Hemoglobin and hematocrit, blood   Collection Time: 10/11/24  5:08 PM  Result Value Ref Range   Hemoglobin 9.5 (L) 13.0 - 17.0 g/dL   HCT 69.3 (L) 60.9 - 47.9 %  Glucose, capillary    Collection Time: 10/11/24  7:17 PM  Result Value Ref Range   Glucose-Capillary 150 (H) 70 - 99 mg/dL  MRSA Next Gen by PCR, Nasal   Collection Time: 10/11/24  8:54 PM   Specimen: Nasal Mucosa; Nasal Swab  Result Value Ref Range   MRSA by PCR Next Gen DETECTED (A) NOT DETECTED  Glucose, capillary   Collection Time: 10/11/24  8:55 PM  Result Value Ref Range   Glucose-Capillary 141 (H) 70 - 99 mg/dL  I-STAT 7, (LYTES, BLD GAS, ICA, H+H)   Collection Time: 10/11/24  9:50 PM  Result Value Ref Range   pH, Arterial 7.497 (H) 7.35 - 7.45   pCO2 arterial 33.8 32 - 48 mmHg   pO2, Arterial 122 (H) 83 - 108 mmHg   Bicarbonate 26.1 20.0 - 28.0 mmol/L   TCO2 27 22 - 32 mmol/L   O2 Saturation 99 %   Acid-Base Excess 3.0 (H) 0.0 - 2.0 mmol/L   Sodium 136 135 - 145 mmol/L   Potassium 3.6 3.5 - 5.1 mmol/L   Calcium, Ion 1.08 (L) 1.15 - 1.40 mmol/L   HCT 26.0 (L) 39.0 - 52.0 %   Hemoglobin 8.8 (L) 13.0 - 17.0 g/dL   Patient temperature 01.3 F    Collection site RADIAL, ALLEN'S TEST ACCEPTABLE    Drawn by RT    Sample type ARTERIAL   Glucose, capillary   Collection Time: 10/11/24 11:08 PM  Result Value Ref Range   Glucose-Capillary 159 (H) 70 - 99 mg/dL  Type and screen [facility]   Collection Time: 10/11/24 11:22 PM  Result Value Ref Range   ABO/RH(D) A POS    Antibody Screen NEG    Sample Expiration      10/14/2024,2359 Performed at [facility], [address], [place], [place] [address]   Hemoglobin and hematocrit, blood   Collection Time: 10/11/24 11:24 PM  Result Value Ref Range   Hemoglobin 8.6 (L) 13.0 - 17.0 g/dL   HCT 72.7 (L) 60.9 - 47.9 %  CBC   Collection Time: 10/12/24  5:13 AM  Result Value Ref  Range   WBC 14.4 (H) 4.0 - 10.5 K/uL   RBC 3.49 (L) 4.22 - 5.81 MIL/uL   Hemoglobin 8.4 (L) 13.0 - 17.0 g/dL   HCT 73.7 (L) 60.9 - 47.9 %   MCV 75.1 (L) 80.0 - 100.0 fL   MCH 24.1 (L) 26.0 - 34.0 pg   MCHC 32.1 30.0 - 36.0 g/dL   RDW 77.3 (H) 88.4 -  15.5 %   Platelets 504 (H) 150 - 400 K/uL   nRBC 0.0 0.0 - 0.2 %  Comprehensive metabolic panel with GFR   Collection Time: 10/12/24  5:13 AM  Result Value Ref Range   Sodium 137 135 - 145 mmol/L   Potassium 3.6 3.5 - 5.1 mmol/L   Chloride 103 98 - 111 mmol/L   CO2 27 22 - 32 mmol/L   Glucose, Bld 146 (H) 70 - 99 mg/dL   BUN 7 6 - 20 mg/dL   Creatinine, Ser 9.20 0.61 - 1.24 mg/dL   Calcium 7.6 (L) 8.9 - 10.3 mg/dL   Total Protein 5.5 (L) 6.5 - 8.1 g/dL   Albumin  2.1 (L) 3.5 - 5.0 g/dL   AST 13 (L) 15 - 41 U/L   ALT 11 0 - 44 U/L   Alkaline Phosphatase 67 38 - 126 U/L   Total Bilirubin 0.8 0.0 - 1.2 mg/dL   GFR, Estimated >39 >39 mL/min   Anion gap 7 5 - 15  Triglycerides   Collection Time: 10/12/24  5:13 AM  Result Value Ref Range   Triglycerides 90 <150 mg/dL  CBC   Collection Time: 10/12/24  8:23 AM  Result Value Ref Range   WBC 14.1 (H) 4.0 - 10.5 K/uL   RBC 3.40 (L) 4.22 - 5.81 MIL/uL   Hemoglobin 8.1 (L) 13.0 - 17.0 g/dL   HCT 74.3 (L) 60.9 - 47.9 %   MCV 75.3 (L) 80.0 - 100.0 fL   MCH 23.8 (L) 26.0 - 34.0 pg   MCHC 31.6 30.0 - 36.0 g/dL   RDW 76.7 (H) 88.4 - 84.4 %   Platelets 511 (H) 150 - 400 K/uL   nRBC 0.0 0.0 - 0.2 %  Expectorated Sputum Assessment w Gram Stain, Rflx to Resp Cult   Collection Time: 10/12/24 10:22 AM   Specimen: Sputum  Result Value Ref Range   Specimen Description SPUTUM    Special Requests NONE    Sputum evaluation      THIS SPECIMEN IS ACCEPTABLE FOR SPUTUM CULTURE Performed at [facility], [address], [place], [place] [address]    Report Status 10/12/2024 FINAL   Culture, Respiratory w Gram Stain   Collection Time: 10/12/24 10:22 AM   Specimen: SPU  Result Value Ref Range   Specimen Description SPUTUM    Special Requests NONE Reflexed from [id number]    Gram Stain      MODERATE WBC PRESENT, PREDOMINANTLY PMN RARE GRAM POSITIVE COCCI IN PAIRS RARE GRAM POSITIVE RODS Performed at [facility], [address]  [place], [place] [address]    Culture RARE CANDIDA ALBICANS    Report Status 10/15/2024 FINAL   Hemoglobin and hematocrit, blood   Collection Time: 10/12/24 11:13 AM  Result Value Ref Range   Hemoglobin 8.6 (L) 13.0 - 17.0 g/dL   HCT 72.7 (L) 60.9 - 47.9 %  Glucose, capillary   Collection Time: 10/12/24 11:43 AM  Result Value Ref Range   Glucose-Capillary 125 (H) 70 - 99 mg/dL  Glucose, capillary  Collection Time: 10/12/24  3:44 PM  Result Value Ref Range   Glucose-Capillary 115 (H) 70 - 99 mg/dL  Hemoglobin and hematocrit, blood   Collection Time: 10/12/24  5:36 PM  Result Value Ref Range   Hemoglobin 8.8 (L) 13.0 - 17.0 g/dL   HCT 71.5 (L) 60.9 - 47.9 %  Lactic acid, plasma   Collection Time: 10/12/24  5:36 PM  Result Value Ref Range   Lactic Acid, Venous 1.8 0.5 - 1.9 mmol/L  Glucose, capillary   Collection Time: 10/12/24  7:52 PM  Result Value Ref Range   Glucose-Capillary 106 (H) 70 - 99 mg/dL  Hemoglobin and hematocrit, blood   Collection Time: 10/12/24  8:12 PM  Result Value Ref Range   Hemoglobin 8.7 (L) 13.0 - 17.0 g/dL   HCT 71.5 (L) 60.9 - 47.9 %  Lactic acid, plasma   Collection Time: 10/12/24  8:12 PM  Result Value Ref Range   Lactic Acid, Venous 1.5 0.5 - 1.9 mmol/L  Glucose, capillary   Collection Time: 10/12/24 11:37 PM  Result Value Ref Range   Glucose-Capillary 101 (H) 70 - 99 mg/dL  Hemoglobin and hematocrit, blood   Collection Time: 10/12/24 11:52 PM  Result Value Ref Range   Hemoglobin 9.1 (L) 13.0 - 17.0 g/dL   HCT 70.3 (L) 60.9 - 47.9 %  Glucose, capillary   Collection Time: 10/13/24  3:48 AM  Result Value Ref Range   Glucose-Capillary 109 (H) 70 - 99 mg/dL  CBC   Collection Time: 10/13/24  4:11 AM  Result Value Ref Range   WBC 17.9 (H) 4.0 - 10.5 K/uL   RBC 3.98 (L) 4.22 - 5.81 MIL/uL   Hemoglobin 9.7 (L) 13.0 - 17.0 g/dL   HCT 68.1 (L) 60.9 - 47.9 %   MCV 79.9 (L) 80.0 - 100.0 fL   MCH 24.4 (L) 26.0 - 34.0 pg   MCHC 30.5 30.0 - 36.0  g/dL   RDW 75.3 (H) 88.4 - 84.4 %   Platelets 441 (H) 150 - 400 K/uL   nRBC 0.0 0.0 - 0.2 %  Comprehensive metabolic panel   Collection Time: 10/13/24  4:11 AM  Result Value Ref Range   Sodium 141 135 - 145 mmol/L   Potassium 3.7 3.5 - 5.1 mmol/L   Chloride 104 98 - 111 mmol/L   CO2 23 22 - 32 mmol/L   Glucose, Bld 121 (H) 70 - 99 mg/dL   BUN 7 6 - 20 mg/dL   Creatinine, Ser 9.18 0.61 - 1.24 mg/dL   Calcium 7.8 (L) 8.9 - 10.3 mg/dL   Total Protein 5.6 (L) 6.5 - 8.1 g/dL   Albumin  2.0 (L) 3.5 - 5.0 g/dL   AST 14 (L) 15 - 41 U/L   ALT 9 0 - 44 U/L   Alkaline Phosphatase 62 38 - 126 U/L   Total Bilirubin 0.4 0.0 - 1.2 mg/dL   GFR, Estimated >39 >39 mL/min   Anion gap 14 5 - 15  Magnesium    Collection Time: 10/13/24  4:11 AM  Result Value Ref Range   Magnesium  2.4 1.7 - 2.4 mg/dL  Phosphorus   Collection Time: 10/13/24  4:11 AM  Result Value Ref Range   Phosphorus 2.8 2.5 - 4.6 mg/dL  Glucose, capillary   Collection Time: 10/13/24  7:22 AM  Result Value Ref Range   Glucose-Capillary 116 (H) 70 - 99 mg/dL  Hemoglobin and hematocrit, blood   Collection Time: 10/13/24  8:14 AM  Result Value  Ref Range   Hemoglobin 8.9 (L) 13.0 - 17.0 g/dL   HCT 70.6 (L) 60.9 - 47.9 %  Glucose, capillary   Collection Time: 10/13/24 11:49 AM  Result Value Ref Range   Glucose-Capillary 109 (H) 70 - 99 mg/dL  Hemoglobin and hematocrit, blood   Collection Time: 10/13/24 12:29 PM  Result Value Ref Range   Hemoglobin 9.8 (L) 13.0 - 17.0 g/dL   HCT 67.0 (L) 60.9 - 47.9 %  Glucose, capillary   Collection Time: 10/13/24  3:54 PM  Result Value Ref Range   Glucose-Capillary 106 (H) 70 - 99 mg/dL  Hemoglobin and hematocrit, blood   Collection Time: 10/13/24  4:54 PM  Result Value Ref Range   Hemoglobin 9.4 (L) 13.0 - 17.0 g/dL   HCT 68.0 (L) 60.9 - 47.9 %  Glucose, capillary   Collection Time: 10/13/24  7:54 PM  Result Value Ref Range   Glucose-Capillary 129 (H) 70 - 99 mg/dL  Hemoglobin and  hematocrit, blood   Collection Time: 10/13/24  7:58 PM  Result Value Ref Range   Hemoglobin 9.0 (L) 13.0 - 17.0 g/dL   HCT 69.3 (L) 60.9 - 47.9 %  Glucose, capillary   Collection Time: 10/14/24 12:10 AM  Result Value Ref Range   Glucose-Capillary 118 (H) 70 - 99 mg/dL  Basic metabolic panel   Collection Time: 10/14/24  2:25 AM  Result Value Ref Range   Sodium 138 135 - 145 mmol/L   Potassium 3.6 3.5 - 5.1 mmol/L   Chloride 108 98 - 111 mmol/L   CO2 24 22 - 32 mmol/L   Glucose, Bld 140 (H) 70 - 99 mg/dL   BUN 8 6 - 20 mg/dL   Creatinine, Ser 9.35 0.61 - 1.24 mg/dL   Calcium 7.6 (L) 8.9 - 10.3 mg/dL   GFR, Estimated >39 >39 mL/min   Anion gap 6 5 - 15  CBC   Collection Time: 10/14/24  2:25 AM  Result Value Ref Range   WBC 18.3 (H) 4.0 - 10.5 K/uL   RBC 3.44 (L) 4.22 - 5.81 MIL/uL   Hemoglobin 8.3 (L) 13.0 - 17.0 g/dL   HCT 71.9 (L) 60.9 - 47.9 %   MCV 81.4 80.0 - 100.0 fL   MCH 24.1 (L) 26.0 - 34.0 pg   MCHC 29.6 (L) 30.0 - 36.0 g/dL   RDW 75.2 (H) 88.4 - 84.4 %   Platelets 494 (H) 150 - 400 K/uL   nRBC 0.0 0.0 - 0.2 %  Magnesium    Collection Time: 10/14/24  2:25 AM  Result Value Ref Range   Magnesium  2.0 1.7 - 2.4 mg/dL  Glucose, capillary   Collection Time: 10/14/24  4:19 AM  Result Value Ref Range   Glucose-Capillary 102 (H) 70 - 99 mg/dL  Glucose, capillary   Collection Time: 10/14/24  7:23 AM  Result Value Ref Range   Glucose-Capillary 119 (H) 70 - 99 mg/dL  Hemoglobin and hematocrit, blood   Collection Time: 10/14/24 11:29 AM  Result Value Ref Range   Hemoglobin 9.9 (L) 13.0 - 17.0 g/dL   HCT 66.4 (L) 60.9 - 47.9 %  Glucose, capillary   Collection Time: 10/14/24 11:57 AM  Result Value Ref Range   Glucose-Capillary 147 (H) 70 - 99 mg/dL  Glucose, capillary   Collection Time: 10/14/24  3:20 PM  Result Value Ref Range   Glucose-Capillary 201 (H) 70 - 99 mg/dL  Hemoglobin and hematocrit, blood   Collection Time: 10/14/24  6:28 PM  Result Value Ref Range    Hemoglobin 10.1 (L) 13.0 - 17.0 g/dL   HCT 64.1 (L) 60.9 - 47.9 %  Glucose, capillary   Collection Time: 10/14/24  7:47 PM  Result Value Ref Range   Glucose-Capillary 134 (H) 70 - 99 mg/dL  Glucose, capillary   Collection Time: 10/14/24 11:36 PM  Result Value Ref Range   Glucose-Capillary 104 (H) 70 - 99 mg/dL  Basic metabolic panel   Collection Time: 10/15/24  2:11 AM  Result Value Ref Range   Sodium 139 135 - 145 mmol/L   Potassium 4.0 3.5 - 5.1 mmol/L   Chloride 109 98 - 111 mmol/L   CO2 23 22 - 32 mmol/L   Glucose, Bld 111 (H) 70 - 99 mg/dL   BUN <5 (L) 6 - 20 mg/dL   Creatinine, Ser 9.46 (L) 0.61 - 1.24 mg/dL   Calcium 7.6 (L) 8.9 - 10.3 mg/dL   GFR, Estimated >39 >39 mL/min   Anion gap 7 5 - 15  CBC   Collection Time: 10/15/24  2:11 AM  Result Value Ref Range   WBC 16.4 (H) 4.0 - 10.5 K/uL   RBC 3.34 (L) 4.22 - 5.81 MIL/uL   Hemoglobin 8.1 (L) 13.0 - 17.0 g/dL   HCT 73.0 (L) 60.9 - 47.9 %   MCV 80.5 80.0 - 100.0 fL   MCH 24.3 (L) 26.0 - 34.0 pg   MCHC 30.1 30.0 - 36.0 g/dL   RDW 75.8 (H) 88.4 - 84.4 %   Platelets 469 (H) 150 - 400 K/uL   nRBC 0.0 0.0 - 0.2 %  Magnesium    Collection Time: 10/15/24  2:11 AM  Result Value Ref Range   Magnesium  1.9 1.7 - 2.4 mg/dL  Glucose, capillary   Collection Time: 10/15/24  3:56 AM  Result Value Ref Range   Glucose-Capillary 110 (H) 70 - 99 mg/dL  Glucose, capillary   Collection Time: 10/15/24  8:18 AM  Result Value Ref Range   Glucose-Capillary 116 (H) 70 - 99 mg/dL  Hemoglobin and hematocrit, blood   Collection Time: 10/15/24 10:26 AM  Result Value Ref Range   Hemoglobin 8.7 (L) 13.0 - 17.0 g/dL   HCT 70.1 (L) 60.9 - 47.9 %  Glucose, capillary   Collection Time: 10/15/24 11:38 AM  Result Value Ref Range   Glucose-Capillary 127 (H) 70 - 99 mg/dL  Glucose, capillary   Collection Time: 10/15/24  3:38 PM  Result Value Ref Range   Glucose-Capillary 106 (H) 70 - 99 mg/dL  CBC   Collection Time: 10/16/24  7:22 AM   Result Value Ref Range   WBC 14.2 (H) 4.0 - 10.5 K/uL   RBC 3.25 (L) 4.22 - 5.81 MIL/uL   Hemoglobin 7.8 (L) 13.0 - 17.0 g/dL   HCT 73.5 (L) 60.9 - 47.9 %   MCV 81.2 80.0 - 100.0 fL   MCH 24.0 (L) 26.0 - 34.0 pg   MCHC 29.5 (L) 30.0 - 36.0 g/dL   RDW 75.2 (H) 88.4 - 84.4 %   Platelets 506 (H) 150 - 400 K/uL   nRBC 0.0 0.0 - 0.2 %  Comprehensive metabolic panel with GFR   Collection Time: 10/16/24  7:22 AM  Result Value Ref Range   Sodium 139 135 - 145 mmol/L   Potassium 4.0 3.5 - 5.1 mmol/L   Chloride 105 98 - 111 mmol/L   CO2 26 22 - 32 mmol/L   Glucose, Bld 96 70 - 99 mg/dL   BUN  5 (L) 6 - 20 mg/dL   Creatinine, Ser 9.38 0.61 - 1.24 mg/dL   Calcium 7.7 (L) 8.9 - 10.3 mg/dL   Total Protein 5.5 (L) 6.5 - 8.1 g/dL   Albumin  1.9 (L) 3.5 - 5.0 g/dL   AST 15 15 - 41 U/L   ALT 10 0 - 44 U/L   Alkaline Phosphatase 48 38 - 126 U/L   Total Bilirubin 0.5 0.0 - 1.2 mg/dL   GFR, Estimated >39 >39 mL/min   Anion gap 8 5 - 15       Pertinent labs & imaging results that were available during my care of the patient were reviewed by me and considered in my medical decision making.  Assessment & Plan:  There are no diagnoses linked to this encounter.   Assessment and Plan Assessment & Plan       Continue all other maintenance medications.  Follow up plan: No follow-ups on file.   Continue healthy lifestyle choices, including diet (rich in fruits, vegetables, and lean proteins, and low in salt and simple carbohydrates) and exercise (at least 30 minutes of moderate physical activity daily).  Educational handout given for ***  The above assessment and management plan was discussed with the patient. The patient verbalized understanding of and has agreed to the management plan. Patient is aware to call the clinic if they develop any new symptoms or if symptoms persist or worsen. Patient is aware when to return to the clinic for a follow-up visit. Patient educated on when it is  appropriate to go to the emergency department.   Amauri Medellin [place] Thompson, DNP [facility] [address] [place], [place] [address] [phone]

## 2024-10-18 ENCOUNTER — Encounter: Payer: Self-pay | Admitting: Nurse Practitioner

## 2024-10-18 ENCOUNTER — Ambulatory Visit: Admitting: Nurse Practitioner

## 2024-10-18 ENCOUNTER — Other Ambulatory Visit: Payer: Self-pay | Admitting: Internal Medicine

## 2024-10-18 VITALS — BP 105/68 | HR 116 | Temp 98.1°F | Ht 69.0 in | Wt 125.6 lb

## 2024-10-18 DIAGNOSIS — E43 Unspecified severe protein-calorie malnutrition: Secondary | ICD-10-CM

## 2024-10-18 DIAGNOSIS — Z09 Encounter for follow-up examination after completed treatment for conditions other than malignant neoplasm: Secondary | ICD-10-CM

## 2024-10-18 DIAGNOSIS — D500 Iron deficiency anemia secondary to blood loss (chronic): Secondary | ICD-10-CM | POA: Diagnosis not present

## 2024-10-18 DIAGNOSIS — Z23 Encounter for immunization: Secondary | ICD-10-CM

## 2024-10-18 DIAGNOSIS — F17210 Nicotine dependence, cigarettes, uncomplicated: Secondary | ICD-10-CM

## 2024-10-18 DIAGNOSIS — K226 Gastro-esophageal laceration-hemorrhage syndrome: Secondary | ICD-10-CM

## 2024-10-18 DIAGNOSIS — K922 Gastrointestinal hemorrhage, unspecified: Secondary | ICD-10-CM | POA: Diagnosis not present

## 2024-10-18 DIAGNOSIS — Z1211 Encounter for screening for malignant neoplasm of colon: Secondary | ICD-10-CM

## 2024-10-18 DIAGNOSIS — Z720 Tobacco use: Secondary | ICD-10-CM | POA: Insufficient documentation

## 2024-10-18 NOTE — Telephone Encounter (Signed)
 Copied from CRM #[id number]. Topic: Clinical - Medication Refill >> Oct 18, 2024  8:58 AM Devaughn RAMAN wrote: Medication: Tiotropium Bromide  (SPIRIVA  RESPIMAT) 2.5 MCG/ACT AERS  Has the patient contacted their pharmacy? Yes (Agent: If no, request that the patient contact the pharmacy for the refill. If patient does not wish to contact the pharmacy document the reason why and proceed with request.) (Agent: If yes, when and what did the pharmacy advise?)  This is the patient's preferred pharmacy:  [id number] - [place], [place] - [address] AT SEC OF [address] [place] [address] Phone: [phone] Fax: [phone]  Is this the correct pharmacy for this prescription? Yes If no, delete pharmacy and type the correct one.   Has the prescription been filled recently? No  Is the patient out of the medication? Yes  Has the patient been seen for an appointment in the last year OR does the patient have an upcoming appointment? Yes  Can we respond through MyChart? No  Agent: Please be advised that Rx refills may take up to 3 business days. We ask that you follow-up with your pharmacy.

## 2024-10-20 MED ORDER — SPIRIVA RESPIMAT 2.5 MCG/ACT IN AERS: 2.0000 | INHALATION_SPRAY | Freq: Every day | RESPIRATORY_TRACT | 11 refills | Status: AC

## 2024-10-26 ENCOUNTER — Encounter: Payer: Self-pay | Admitting: Internal Medicine

## 2024-10-26 ENCOUNTER — Ambulatory Visit: Admitting: Internal Medicine

## 2024-10-26 VITALS — BP 99/66 | HR 125 | Ht 69.0 in | Wt 122.0 lb

## 2024-10-26 DIAGNOSIS — J449 Chronic obstructive pulmonary disease, unspecified: Secondary | ICD-10-CM

## 2024-10-26 DIAGNOSIS — F17210 Nicotine dependence, cigarettes, uncomplicated: Secondary | ICD-10-CM | POA: Diagnosis not present

## 2024-10-26 MED ORDER — ALBUTEROL SULFATE HFA 108 (90 BASE) MCG/ACT IN AERS: INHALATION_SPRAY | RESPIRATORY_TRACT | 11 refills | Status: AC

## 2024-10-26 NOTE — Progress Notes (Unsigned)
 Alan [name], male    DOB: 10-03-1966    MRN: [id number]   Brief patient profile:  20  yobm  active smoker in [employer] x 4 y referred to pulmonary clinic in [place]  05/12/2023 by Cathlene NP at [facility] for copd eval with onset of doe around 2019 but already slowed down by severe R knee problems    Had aecopd vs pna in may 2024 rx abx / prednisone  and no worse since quit  PFT's  09/28/23  FEV1 1.62 (45 % ) ratio 0.46  p 0 % improvement from saba p ?dulera /spiriva   prior to study with DLCO  12.87 (48%)   and FV curve classically concave       History of Present Illness  05/12/2023  Pulmonary/ 1st office eval/ [place] [place] / [name] dulera  100 Chief Complaint  Patient presents with   Consult  Dyspnea:  struggle to get to car - stops half a flight of steps one flight  Walks to park maybe 50 ft and has stop  Cough:congested/rattlng/ min productive was green now mostly white  Sleep: in a recliner 45 degrees or couch with 2 pillows  SABA use: just dulera   02: none  Lung cancer screen: rec Rec Plan A = Automatic = Always=    Dulera  100 x 2 puffs first thing each am/ spiriva  2.5 x 2 puffs then 12 hours later just 2 more puffs of the dulera   Work on inhaler technique:  The key is to stop smoking completely before smoking completely stops you!    11/08/2023  f/u ov/[employer] office/[name] re: GOLD 3 copd  maint on  dulera  /spiriva  Chief Complaint  Patient presents with   Follow-up  Dyspnea:  no change doe Cough: minimal white worse in am  Sleeping: 45 degrees recliner ok/worse cough when flatter SABA use: doesn't have one  02: none Lung cancer screening: 07/15/23  rads 2 with emphysema / moderate  Rec The key is to stop smoking completely before smoking completely stops you! Plan A = Automatic = Always=    dulera  100 mg 2 each and spiriva  x 2 pffs, then 12hours later repeat just the dulera   Plan B = Backup (to supplement plan A, not to replace it) Only use your albuterol   inhaler as a rescue medication Also  Ok to try albuterol  15 min before an activity (on alternating days)  that you know would usually make you short of breath   05/09/2024  f/u ov/[name]/[name] re: GOLD 3 copd/ still smoking  maint on dulera  /spiriva   Chief Complaint  Patient presents with   Follow-up   COPD  Dyspnea:  a little easier across the parking lot/ stops one half way up each flight of steps Cough: smoker's rattle. Worse in am clears out x an hour Sleeping: 45 degrees helps       SABA use: 3-4 x per day, seems better if uses pre-exertion but still using too much post ex and not really triggering it correctly using hfa  02: none  Rec Work on inhaler technique:   Please schedule a follow up visit in 6  months but call sooner if needed  with all medications /inhalers/ solutions in hand   CT chest 10/11/24  Emphysema and airway thickening suspicious for bronchitis or reactive airways disease, with substantial airway plugging occluding both lower lobe bronchi bilaterally.    10/26/2024  f/u ov/ office/[name] re: GOLD 3 COPD  maint on dulera  100/ spiriva  did not  bring meds  still  smoker Chief Complaint  Patient presents with   COPD    doe   Dyspnea:  groceries in from care  Cough: no problem Sleeping: sofa propped up against back of couch s noct    resp cc  SABA use: none doesn't have it  02: none    CT chest w/o contrast  10/11/24  neg nodules / emphysema   No obvious day to day or daytime variability or assoc excess/ purulent sputum or mucus plugs or hemoptysis or cp or chest tightness, subjective wheeze or overt sinus or hb symptoms.    Also denies any obvious fluctuation of symptoms with weather or environmental changes or other aggravating or alleviating factors except as outlined above   No unusual exposure hx or h/o childhood pna/ asthma or knowledge of premature birth.  Current Allergies, Complete Past Medical History, Past Surgical History,  Family History, and Social History were reviewed in [employer] record.  ROS  The following are not active complaints unless bolded Hoarseness, sore throat, dysphagia, dental problems, itching, sneezing,  nasal congestion or discharge of excess mucus or purulent secretions, ear ache,   fever, chills, sweats, unintended wt loss or wt gain, classically pleuritic or exertional cp,  orthopnea pnd or arm/hand swelling  or leg swelling, presyncope, palpitations, abdominal pain, anorexia, nausea, vomiting, diarrhea  or change in bowel habits or change in bladder habits, change in stools or change in urine, dysuria, hematuria,  rash, arthralgias, visual complaints, headache, numbness, weakness or ataxia or problems with walking or coordination,  change in mood or  memory.         Outpatient Medications Prior to Visit  Medication Sig Dispense Refill   ADEMPAS  2.5 MG TABS Take 1 tablet by mouth 3 (three) times daily. 90 tablet 3   albuterol  (PROAIR  HFA) 108 (90 Base) MCG/ACT inhaler 2 puffs every 4 hours as needed only  if your can't catch your breath 18 g 11   apixaban  (ELIQUIS ) 5 MG TABS tablet Take 1 tablet (5 mg total) by mouth 2 (two) times daily. 60 tablet 5   Aspirin -Caffeine (BAYER BACK & BODY PO) Take 1 tablet by mouth daily as needed (pain).     aspirin -sod bicarb-citric acid (ALKA-SELTZER) 325 MG TBEF tablet Take 650 mg by mouth every 6 (six) hours as needed (indigestion).     Dihydroxyaluminum Sod Carb (ROLAIDS PO) Take 1 tablet by mouth daily as needed (acid reflux).     famotidine-calcium carbonate-magnesium  hydroxide (PEPCID COMPLETE) 10-800-165 MG chewable tablet Chew 1 tablet by mouth daily as needed (acid reflux).     ferrous sulfate  325 (65 FE) MG tablet Take 1 tablet (325 mg total) by mouth every other day. 60 tablet 3   folic acid  (FOLVITE ) 1 MG tablet Take 1 tablet (1 mg total) by mouth daily. 30 tablet 0   mometasone -formoterol  (DULERA ) 100-5 MCG/ACT AERO Inhale  2 puffs into the lungs 2 (two) times daily. 39 g 0   ondansetron  (ZOFRAN ) 4 MG tablet Take 1 tablet (4 mg total) by mouth every 6 (six) hours as needed for nausea. 20 tablet 0   pantoprazole  (PROTONIX ) 40 MG tablet Take 1 tablet (40 mg total) by mouth 2 (two) times daily for 42 days, THEN 1 tablet (40 mg total) daily. 114 tablet 0   thiamine  (VITAMIN B1) 100 MG tablet Take 1 tablet (100 mg total) by mouth daily. 30 tablet 0   Tiotropium Bromide  (SPIRIVA  RESPIMAT) 2.5 MCG/ACT AERS  Inhale 2 puffs into the lungs daily. 4 g 11   torsemide  (DEMADEX ) 20 MG tablet Take 10 mg by mouth every other day.     No facility-administered medications prior to visit.            Objective:    Wts  10/26/2024      ***  05/09/2024         102   11/08/2023     117   10/13/23 125 lb (56.7 kg)  09/20/23 116 lb 9.6 oz (52.9 kg)  09/13/23 130 lb (59 kg)    Vital signs reviewed  10/26/2024  - Note at rest 02 sats  ***% on ***   General appearance:    ***    Mild b***  .     Assessment

## 2024-10-26 NOTE — Patient Instructions (Signed)
 Use your albuterol  as a rescue medication to be used if you can't catch your breath by resting, slowing your pace,  or doing a relaxed purse lip breathing pattern.  - The less you use it, the better it will work when you need it. - Ok to use up to 2 puffs  every 4 hours if you must but call for  appointment if use goes up over your usual need - Don't leave home without it !!  (think of it like a spare tire or starter fluid for your car)   Also  Ok to try albuterol  15 min before an activity (on alternating days)  that you know would usually make you short of breath and see if it makes any difference and if makes none then don't take albuterol  after activity unless you can't catch your breath as this means it's the resting that helps, not the albuterol .   Please schedule a follow up visit in 6  months but call sooner if needed

## 2024-10-27 NOTE — Assessment & Plan Note (Addendum)
 Active smoker - 05/12/2023   Walked on RA  x  3  lap(s) =  approx 450  ft  @ mod pace, stopped due to end of study with lowest 02 sats 94% s sob/chest discomfort  - 05/12/2023  ry adding spiriva  2.5 to dulera  100  -  Chest LDSCT   07/15/23  mod emphysema  PFT's  09/28/23  FEV1 1.62 (45 % ) ratio 0.46  p 0 % improvement from saba p ?dulera /spiriva   prior to study with DLCO  12.87 (48%)   and FV curve classically concave   - 05/09/2024  After extensive coaching inhaler device,  effectiveness =    75% with hfa/ 90% with smi from baselines well less than 50% due to short Ti    Group E in terms of symptoms/risk so  laba/lama/ICS  therefore appropriate rx at this point >>>  dulear 100 / spiriva  1.25 maint rx   and needs to learn more approp SABA prn.  Re SABA :  I spent extra time with pt today reviewing appropriate use of albuterol  for prn use on exertion with the following points: 1) saba is for relief of sob that does not improve by walking a slower pace or resting but rather if the pt does not improve after trying this first. 2) If the pt is convinced, as many are, that saba helps recover from activity faster then it's easy to tell if this is the case by re-challenging : ie stop, take the inhaler, then p 5 minutes try the exact same activity (intensity of workload) that just caused the symptoms and see if they are substantially diminished or not after saba 3) if there is an activity that reproducibly causes the symptoms, try the saba 15 min before the activity on alternate days   If in fact the saba really does help, then fine to continue to use it prn but advised may need to look closer at the maintenance regimen being used to achieve better control of airways disease with exertion.

## 2024-10-27 NOTE — Assessment & Plan Note (Addendum)
 Active smoker - Referred for LDSCT 05/12/2023 > last CT  10/11/24 so  >>>  LDSCT q December  >>> Counseled re importance of smoking cessation but did not meet time criteria for separate billing           Each maintenance medication was reviewed in detail including emphasizing most importantly the difference between maintenance and prns and under what circumstances the prns are to be triggered using an action plan format where appropriate.  Total time for H and P, chart review, counseling, reviewing hfa device(s) and generating customized AVS unique to this office visit / same day charting = 31 min

## 2024-10-30 ENCOUNTER — Telehealth: Payer: Self-pay

## 2024-10-30 NOTE — Telephone Encounter (Signed)
 Called and spoke to pt's friend [name] to confirm/remind patient of their appointment at [facility] on 10/30/24.   Appointment:   [x] Confirmed  [] Left mess   [] No answer/No voice mail  [] VM Full/unable to leave message  [] Phone not in service  Patient reminded to bring all medications and/or complete list.  Confirmed patient has transportation. Gave directions, instructed to utilize valet parking.

## 2024-10-31 ENCOUNTER — Ambulatory Visit (HOSPITAL_COMMUNITY): Admission: RE | Admit: 2024-10-31 | Source: Ambulatory Visit

## 2024-10-31 ENCOUNTER — Ambulatory Visit (HOSPITAL_COMMUNITY)

## 2024-11-06 ENCOUNTER — Ambulatory Visit: Admitting: Internal Medicine

## 2024-11-17 ENCOUNTER — Other Ambulatory Visit: Payer: Self-pay | Admitting: *Deleted

## 2024-11-17 DIAGNOSIS — Z720 Tobacco use: Secondary | ICD-10-CM

## 2024-11-17 DIAGNOSIS — R053 Chronic cough: Secondary | ICD-10-CM

## 2024-11-17 MED ORDER — DULERA 100-5 MCG/ACT IN AERO: 2.0000 | INHALATION_SPRAY | Freq: Two times a day (BID) | RESPIRATORY_TRACT | 0 refills | Status: AC

## 2024-11-17 NOTE — Telephone Encounter (Signed)
 I called pt & made him an appt w/[facility] on 02-27-2025 for med refill.--Sandra's pt.

## 2024-11-17 NOTE — Telephone Encounter (Signed)
 Alan [name] in April for 4 mos FU w/ new provider RFs sent to pharmacy

## 2024-11-22 ENCOUNTER — Telehealth (HOSPITAL_COMMUNITY): Payer: Self-pay | Admitting: *Deleted

## 2024-11-22 NOTE — Telephone Encounter (Signed)
 Called to confirm/remind patient of their appointment at the [facility] on 11/23/24.  He is to arrive at 2:00 pm for echo followed by appointment with us  at 3:00 pm.        Appointment:              [x] Confirmed             [] Left mess              [] No answer/No voice mail             [] Phone not in service   Patient reminded to bring all medications and/or complete list.   Confirmed patient has transportation. Gave directions, instructed to utilize valet parking.

## 2024-11-23 ENCOUNTER — Ambulatory Visit (HOSPITAL_COMMUNITY)
Admission: RE | Admit: 2024-11-23 | Discharge: 2024-11-23 | Disposition: A | Source: Ambulatory Visit | Attending: Cardiology | Admitting: Cardiology

## 2024-11-23 ENCOUNTER — Ambulatory Visit (HOSPITAL_COMMUNITY)
Admission: RE | Admit: 2024-11-23 | Discharge: 2024-11-23 | Disposition: A | Discharge status: Home / Self Care | Source: Ambulatory Visit | Attending: Cardiology | Admitting: Cardiology

## 2024-11-23 ENCOUNTER — Ambulatory Visit (HOSPITAL_COMMUNITY): Payer: Self-pay | Admitting: Physician Assistant

## 2024-11-23 ENCOUNTER — Encounter (HOSPITAL_COMMUNITY): Payer: Self-pay

## 2024-11-23 VITALS — BP 92/64 | HR 109 | Ht 69.0 in | Wt 118.0 lb

## 2024-11-23 DIAGNOSIS — I5032 Chronic diastolic (congestive) heart failure: Secondary | ICD-10-CM | POA: Diagnosis not present

## 2024-11-23 DIAGNOSIS — J439 Emphysema, unspecified: Secondary | ICD-10-CM | POA: Diagnosis not present

## 2024-11-23 DIAGNOSIS — Z716 Tobacco abuse counseling: Secondary | ICD-10-CM | POA: Diagnosis not present

## 2024-11-23 DIAGNOSIS — I272 Pulmonary hypertension, unspecified: Secondary | ICD-10-CM | POA: Diagnosis present

## 2024-11-23 DIAGNOSIS — I2782 Chronic pulmonary embolism: Secondary | ICD-10-CM

## 2024-11-23 DIAGNOSIS — R0902 Hypoxemia: Secondary | ICD-10-CM

## 2024-11-23 DIAGNOSIS — R000 Tachycardia, unspecified: Secondary | ICD-10-CM | POA: Diagnosis not present

## 2024-11-23 DIAGNOSIS — Z7901 Long term (current) use of anticoagulants: Secondary | ICD-10-CM | POA: Diagnosis not present

## 2024-11-23 DIAGNOSIS — I517 Cardiomegaly: Secondary | ICD-10-CM | POA: Insufficient documentation

## 2024-11-23 DIAGNOSIS — K226 Gastro-esophageal laceration-hemorrhage syndrome: Secondary | ICD-10-CM | POA: Diagnosis not present

## 2024-11-23 DIAGNOSIS — F17210 Nicotine dependence, cigarettes, uncomplicated: Secondary | ICD-10-CM | POA: Insufficient documentation

## 2024-11-23 DIAGNOSIS — Z9981 Dependence on supplemental oxygen: Secondary | ICD-10-CM | POA: Diagnosis not present

## 2024-11-23 LAB — ECHOCARDIOGRAM COMPLETE
Area-P 1/2: 5.42 cm2
Calc EF: 53.6 %
S' Lateral: 3.1 cm
Single Plane A2C EF: 57.6 %
Single Plane A4C EF: 54.4 %

## 2024-11-23 LAB — PRO BRAIN NATRIURETIC PEPTIDE: Pro Brain Natriuretic Peptide: 182 pg/mL

## 2024-11-23 LAB — BASIC METABOLIC PANEL WITH GFR
Anion gap: 8 (ref 5–15)
BUN: 7 mg/dL (ref 6–20)
CO2: 30 mmol/L (ref 22–32)
Calcium: 8.8 mg/dL — ABNORMAL LOW (ref 8.9–10.3)
Chloride: 105 mmol/L (ref 98–111)
Creatinine, Ser: 0.89 mg/dL (ref 0.61–1.24)
GFR, Estimated: >60 mL/min
Glucose, Bld: 88 mg/dL (ref 70–99)
Potassium: 4.4 mmol/L (ref 3.5–5.1)
Sodium: 142 mmol/L (ref 135–145)

## 2024-11-23 NOTE — Progress Notes (Signed)
 Order for home O2 placed and sent to [facility]

## 2024-11-23 NOTE — Addendum Note (Signed)
 Encounter addended by: Colletta Manuelita Garre, PA-C on: 11/23/2024 5:13 PM  Actions taken: Clinical Note Signed

## 2024-11-23 NOTE — Progress Notes (Signed)
 6 Min Walk Test Completed  Pt ambulated 700 ft (213.36 m) O2 Sat ranged 94%-87% on room air HR ranged 100-155 6 min walk stopped @ 3 minutes due to hypoxia and increased heart rate. Denied any shortness of breathe. L. Colletta PA made aware

## 2024-11-23 NOTE — Patient Instructions (Signed)
 Medication Changes:  None, continue current medications  Lab Work:  Labs done today, your results will be available in MyChart, we will contact you for abnormal readings.   Referrals:  You have been referred to [facility] for home oxygen, they will call you to schedule delivery  Special Instructions // Education:  Do the following things EVERYDAY: Weigh yourself in the morning before breakfast. Write it down and keep it in a log. Take your medicines as prescribed Eat low salt foods--Limit salt (sodium) to 2000 mg per day.  Stay as active as you can everyday Limit all fluids for the day to less than 2 liters   Follow-Up in: 3 months   At the [facility], you and your health needs are our priority. We have a designated team specialized in the treatment of Heart Failure. This Care Team includes your primary Heart Failure Specialized Cardiologist (physician), Advanced Practice Providers (APPs- Physician Assistants and Nurse Practitioners), and Pharmacist who all work together to provide you with the care you need, when you need it.   You may see any of the following providers on your designated Care Team at your next follow up:  Dr. Toribio Fuel Dr. Ezra Shuck Dr. Odis Brownie Greig Mosses, NP Caffie Shed, [place] [facility] [place], [place] Beckey Coe, NP Jordan Lee, NP Tinnie Redman, PharmD   Please be sure to bring in all your medications bottles to every appointment.   Need to Contact Us :  If you have any questions or concerns before your next appointment please send us  a message through [place] or call our office at [phone].    TO LEAVE A MESSAGE FOR THE NURSE SELECT OPTION 2, PLEASE LEAVE A MESSAGE INCLUDING: YOUR NAME DATE OF BIRTH CALL BACK NUMBER REASON FOR CALL**this is important as we prioritize the call backs  YOU WILL RECEIVE A CALL BACK THE SAME DAY AS LONG AS YOU CALL BEFORE 4:00 PM

## 2024-11-23 NOTE — Progress Notes (Signed)
 SATURATION QUALIFICATIONS: (This note is used to comply with regulatory documentation for home oxygen)  Patient Saturations on Room Air at Rest = 94%  Patient Saturations on Room Air while Ambulating = 87%  Patient Saturations on 2 Liters of oxygen while Ambulating = 93%  Please briefly explain why patient needs home oxygen: pulmonary hypertension, hypoxia

## 2024-11-23 NOTE — Progress Notes (Addendum)
" ° °  ADVANCED HEART FAILURE FOLLOW UP CLINIC NOTE  Referring Physician: Cathlene Marry Lenis, FNP  Primary Care: Deitra Morton Sebastian Nena, NP Primary Cardiologist:  HPI: Alan [name] is a 59 y.o. male who presents for follow up of pulmonary hypertension. Other history includes COPD, ETOH and tobacco use.  Patient was referred to pulmonary hypertension clinic for a past medical history of pulmonary hypertension diagnosed by echocardiogram on 05/2023.  VQ scan was abnormal on 08/2023 showing multiple bilateral wedge-shaped perfusion defects.  They did not improve also with inhaler therapy, likely has a past medical history of COPD.  He was referred to pulmonary hypertension clinic for further evaluation.  Underwent right heart catheterization 04/2024 which showed moderately elevated PA pressures, improved from echocardiogram, as well as mild to moderate disease in pulmonary angiogram.  Patient was started on Adempas  and continued on anticoagulation.  He was admitted in 12/25 with hematemesis and critically low Hgb requiring multiple untis of RBCs. EGD showed severe Mallory-Weiss tear w/ hemorrhage. He was treated with abx for possible mediastinitis. Able to restart anticoagulation prior to discharge.  SUBJECTIVE:  Here today for pulmonary hypertension follow-up.  PMH, current medications, allergies, social history, and family history reviewed in epic.  PHYSICAL EXAM: Vitals:   11/23/24 1510  BP: 92/64  Pulse: (!) 109  SpO2: 93%  Weight 118 lb  General:  Elderly appearing. Ambulated into clinic. Neck: No JVD.   Cor: Regular rate & rhythm, mildly tachy. No murmurs. Lungs: diminished Abdomen: soft, nontender, nondistended. Extremities: no edema Neuro: alert & orientedx3. 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 Abnormal V/Q scan and PA angiogram. Given his improvement in PA pressures on angiogram, mild obstructive disease is currently on medical therapy with riociguat  as well as anticoagulation. - Echo today reviewed with Dr. Zenaida: LVEF preserved, RV mildly reduced, RVSP 41 mmHg (final report pending) - Eventually consider repeat RHC (potentially arrange at next follow-up) - 6 minute walk stopped at 3 minutes due to desaturations 94%>87%, recovered with rest. Tachycardia also noted. Confirmed sinus tach on rhythm strip. Set up home O2 to use with exertion.  - Last BNP normal, check proBNP today - Continue riociguat  2.5 mg 3 times daily - Continue inhaler therapy per pulmonary - Autoimmune workup negative - Volume status okay. Continue 10 mg Torsemide  very other day  COPD: Managed by pulmonary, stable symptoms. - PFTs reviewed - Reports adherence with inhalers - Discussed smoking cessation.   PE: Noted on prior V/Q scan, currently on apixaban  5mg  BID - Lifelong therapy  Upper GI bleeding - 2/2 Mallory-Weiss tear in 12/25 - He is back on anticoagulation with Eliquis . CBC today    Follow up 3 months with Dr. Zenaida Manuelita Dutch, PA-C [facility] 11/23/24 "

## 2024-12-27 ENCOUNTER — Ambulatory Visit: Admitting: Internal Medicine

## 2025-02-21 ENCOUNTER — Ambulatory Visit (HOSPITAL_COMMUNITY): Admitting: Cardiology

## 2025-02-27 ENCOUNTER — Encounter: Admitting: Family Medicine
